# Patient Record
Sex: Male | Born: 1950 | Hispanic: No | Marital: Single
Health system: Southern US, Community
[De-identification: ages and names within clinical notes are randomized; demographics above are authoritative.]

## PROBLEM LIST (undated history)

## (undated) DIAGNOSIS — I482 Chronic atrial fibrillation, unspecified: Secondary | ICD-10-CM

## (undated) DIAGNOSIS — N186 End stage renal disease: Secondary | ICD-10-CM

## (undated) DIAGNOSIS — I5022 Chronic systolic (congestive) heart failure: Secondary | ICD-10-CM

## (undated) DIAGNOSIS — A0472 Enterocolitis due to Clostridium difficile, not specified as recurrent: Secondary | ICD-10-CM

## (undated) DIAGNOSIS — I350 Nonrheumatic aortic (valve) stenosis: Secondary | ICD-10-CM

## (undated) DIAGNOSIS — E119 Type 2 diabetes mellitus without complications: Secondary | ICD-10-CM

## (undated) DIAGNOSIS — J90 Pleural effusion, not elsewhere classified: Secondary | ICD-10-CM

## (undated) DIAGNOSIS — J9621 Acute and chronic respiratory failure with hypoxia: Secondary | ICD-10-CM

## (undated) HISTORY — PX: PEG PLACEMENT: SHX5437

## (undated) HISTORY — PX: BALLOON AORTIC VALVE VALVULOPLASTY: SHX6412

## (undated) HISTORY — PX: TRACHEOSTOMY: SUR1362

---

## 2019-11-08 HISTORY — PX: ESOPHAGOGASTRODUODENOSCOPY: SHX1529

## 2019-12-15 ENCOUNTER — Inpatient Hospital Stay
Admission: RE | Admit: 2019-12-15 | Discharge: 2020-03-07 | Disposition: E | Payer: Medicare Other | Source: Other Acute Inpatient Hospital | Attending: Internal Medicine | Admitting: Internal Medicine

## 2019-12-15 ENCOUNTER — Other Ambulatory Visit (HOSPITAL_COMMUNITY): Payer: Medicare Other

## 2019-12-15 DIAGNOSIS — R0603 Acute respiratory distress: Secondary | ICD-10-CM

## 2019-12-15 DIAGNOSIS — J9690 Respiratory failure, unspecified, unspecified whether with hypoxia or hypercapnia: Secondary | ICD-10-CM

## 2019-12-15 DIAGNOSIS — J189 Pneumonia, unspecified organism: Secondary | ICD-10-CM

## 2019-12-15 DIAGNOSIS — J939 Pneumothorax, unspecified: Secondary | ICD-10-CM

## 2019-12-15 DIAGNOSIS — Z9889 Other specified postprocedural states: Secondary | ICD-10-CM

## 2019-12-15 DIAGNOSIS — T17908A Unspecified foreign body in respiratory tract, part unspecified causing other injury, initial encounter: Secondary | ICD-10-CM

## 2019-12-15 DIAGNOSIS — I5022 Chronic systolic (congestive) heart failure: Secondary | ICD-10-CM | POA: Diagnosis present

## 2019-12-15 DIAGNOSIS — R0902 Hypoxemia: Secondary | ICD-10-CM

## 2019-12-15 DIAGNOSIS — Z992 Dependence on renal dialysis: Secondary | ICD-10-CM

## 2019-12-15 DIAGNOSIS — D72829 Elevated white blood cell count, unspecified: Secondary | ICD-10-CM

## 2019-12-15 DIAGNOSIS — J9621 Acute and chronic respiratory failure with hypoxia: Secondary | ICD-10-CM | POA: Diagnosis present

## 2019-12-15 DIAGNOSIS — Z431 Encounter for attention to gastrostomy: Secondary | ICD-10-CM

## 2019-12-15 DIAGNOSIS — K567 Ileus, unspecified: Secondary | ICD-10-CM

## 2019-12-15 DIAGNOSIS — Z9911 Dependence on respirator [ventilator] status: Secondary | ICD-10-CM

## 2019-12-15 DIAGNOSIS — Z931 Gastrostomy status: Secondary | ICD-10-CM

## 2019-12-15 DIAGNOSIS — Z95828 Presence of other vascular implants and grafts: Secondary | ICD-10-CM

## 2019-12-15 DIAGNOSIS — R1110 Vomiting, unspecified: Secondary | ICD-10-CM

## 2019-12-15 DIAGNOSIS — R110 Nausea: Secondary | ICD-10-CM

## 2019-12-15 DIAGNOSIS — I482 Chronic atrial fibrillation, unspecified: Secondary | ICD-10-CM | POA: Diagnosis present

## 2019-12-15 DIAGNOSIS — N186 End stage renal disease: Secondary | ICD-10-CM

## 2019-12-15 DIAGNOSIS — J90 Pleural effusion, not elsewhere classified: Secondary | ICD-10-CM

## 2019-12-15 HISTORY — DX: End stage renal disease: N18.6

## 2019-12-15 HISTORY — DX: Nonrheumatic aortic (valve) stenosis: I35.0

## 2019-12-15 HISTORY — DX: Pleural effusion, not elsewhere classified: J90

## 2019-12-15 HISTORY — DX: Acute and chronic respiratory failure with hypoxia: J96.21

## 2019-12-15 HISTORY — DX: Enterocolitis due to Clostridium difficile, not specified as recurrent: A04.72

## 2019-12-15 HISTORY — DX: Chronic systolic (congestive) heart failure: I50.22

## 2019-12-15 HISTORY — DX: Type 2 diabetes mellitus without complications: E11.9

## 2019-12-15 HISTORY — DX: Chronic atrial fibrillation, unspecified: I48.20

## 2019-12-15 LAB — BLOOD GAS, ARTERIAL
Acid-Base Excess: 1.9 mmol/L (ref 0.0–2.0)
Bicarbonate: 26.5 mmol/L (ref 20.0–28.0)
FIO2: 30
O2 Saturation: 98.8 %
Patient temperature: 36.6
pCO2 arterial: 44.7 mmHg (ref 32.0–48.0)
pH, Arterial: 7.388 (ref 7.350–7.450)
pO2, Arterial: 99.2 mmHg (ref 83.0–108.0)

## 2019-12-15 MED ORDER — IOHEXOL 300 MG/ML  SOLN: 50.0000 mL | Freq: Once | INTRAMUSCULAR | Status: DC | PRN

## 2019-12-16 DIAGNOSIS — J9621 Acute and chronic respiratory failure with hypoxia: Secondary | ICD-10-CM | POA: Diagnosis not present

## 2019-12-16 DIAGNOSIS — I5022 Chronic systolic (congestive) heart failure: Secondary | ICD-10-CM

## 2019-12-16 DIAGNOSIS — J90 Pleural effusion, not elsewhere classified: Secondary | ICD-10-CM | POA: Diagnosis not present

## 2019-12-16 DIAGNOSIS — I482 Chronic atrial fibrillation, unspecified: Secondary | ICD-10-CM

## 2019-12-16 DIAGNOSIS — N186 End stage renal disease: Secondary | ICD-10-CM

## 2019-12-16 DIAGNOSIS — Z992 Dependence on renal dialysis: Secondary | ICD-10-CM

## 2019-12-16 LAB — APTT: aPTT: 41 seconds — ABNORMAL HIGH (ref 24–36)

## 2019-12-16 LAB — PROTIME-INR
INR: 1.1 (ref 0.8–1.2)
Prothrombin Time: 14.1 seconds (ref 11.4–15.2)

## 2019-12-16 LAB — HEMOGLOBIN A1C
Hgb A1c MFr Bld: 4.4 % — ABNORMAL LOW (ref 4.8–5.6)
Mean Plasma Glucose: 79.58 mg/dL

## 2019-12-16 LAB — TSH: TSH: 16.546 u[IU]/mL — ABNORMAL HIGH (ref 0.350–4.500)

## 2019-12-16 NOTE — Consult Note (Signed)
[facility] [place]  PULMONARY SERVICE  Date of Service: 12/16/2019  PULMONARY CRITICAL CARE CONSULT   Jose Tucker  EGB:[id number]  DOB: [birth year]   DOA: 01/01/2020  Referring Physician: Merton Border, MD  HPI: Jose Tucker is a 69 y.o. male seen for follow up of Acute on Chronic Respiratory Failure.  Patient has multiple medical problems including chronic kidney disease diabetes mellitus hyperlipidemia hypertension who presents to the hospital because of having fallen out of bed and laying there for about 48 hours.  Patient apparently developed rhabdomyolysis and also had renal failure.  Echocardiogram showed an ejection fraction of 35%.  Patient apparently had an AV fistula which clotted off and that subsequently had to be removed.  Also had significant anemia requiring major transfusions.  The patient also has a history of severe aortic stenosis other issues included development of cardiac arrest.  Post arrest patient had ongoing issues with low heart rate as well as heart failure.  Patient now presents to our facility for further management and weaning.  Currently is on pressure support mode and is requiring 30% FiO2  Review of Systems:  ROS performed and is unremarkable other than noted above.  Past Medical History:  Diagnosis Date  . Chronic kidney disease  . Diabetes mellitus (CMS-HCC)  . Hyperlipidemia  . Hypertension   Past Surgical History:  Procedure Laterality Date  . CHG ANGIO EXTERMITY BILAT Bilateral 11/05/2019  Procedure: ABDOMINAL AORTAGRAM WITH BILATERAL RUNOFF AND POSSIBLE REVASCULARIZATION; Surgeon: Irene Limbo, MD; Location: [facility]; Service: Cardiology  . PR CATH PLACE/CORON ANGIO, IMG SUPER/INTERP,R&L HRT CATH, L HRT VENTRIC N/A 10/12/2019  Procedure: Left/Right Heart Catheterization W Intervention; Surgeon: Jerrye Beavers, MD; Location: [facility]; Service: Cardiology  . PR PERC CLOS,CONG INTERATRIAL  COMMUN W/IMPL N/A 11/05/2019  Procedure: BALLOON AORTIC VALVULOPLASTY; Surgeon: Baxter Flattery, MD; Location: [facility]; Service: Cardiology  . PR TRACHEOSTOMY, PLANNED N/A 11/28/2019  Procedure: TRACHEOSTOMY; Surgeon: Kennyth Lose, MD; Location: [facility]; Service: ENT   Allergies No Known Allergies   Medications: Reviewed on Rounds  Physical Exam:  Vitals: Temperature is 96.3 pulse 75 respiratory rate 22 blood pressure is 118/70 saturations 100%  Ventilator Settings mode ventilation pressure support FiO2 30% pressure poor 12 PEEP 5 tidal volume 423  . General: Comfortable at this time . Eyes: Grossly normal lids, irises & conjunctiva . ENT: grossly tongue is normal . Neck: no obvious mass . Cardiovascular: S1-S2 normal no gallop or rub . Respiratory: No rhonchi coarse breath sounds . Abdomen: Soft and nontender . Skin: no rash seen on limited exam . Musculoskeletal: not rigid . Psychiatric:unable to assess . Neurologic: no seizure no involuntary movements         Labs on Admission:  Basic Metabolic Panel: Recent Labs  Lab 12/16/19 0707  NA 136  K 4.1  CL 100  CO2 25  GLUCOSE 109*  BUN 47*  CREATININE 4.77*  CALCIUM 8.8*    Recent Labs  Lab 12/14/2019 2320  PHART 7.388  PCO2ART 44.7  PO2ART 99.2  HCO3 26.5  O2SAT 98.8    Liver Function Tests: Recent Labs  Lab 12/16/19 0707  AST 19  ALT 11  ALKPHOS 210*  BILITOT 0.8  PROT 6.3*  ALBUMIN 1.9*   No results for input(s): LIPASE, AMYLASE in the last 168 hours. No results for input(s): AMMONIA in the last 168 hours.  CBC: Recent Labs  Lab 12/16/19 0707  WBC 6.7  HGB 9.7*  HCT  31.4*  MCV 99.4  PLT 189    Cardiac Enzymes: No results for input(s): CKTOTAL, CKMB, CKMBINDEX, TROPONINI in the last 168 hours.  BNP (last 3 results) No results for input(s): BNP in the last 8760 hours.  ProBNP (last 3 results) No results for input(s): PROBNP in the last 8760 hours.   Radiological Exams on  Admission: DG ABDOMEN PEG TUBE LOCATION  Result Date: 12/16/2019 CLINICAL DATA:  Status post gastrostomy placement EXAM: ABDOMEN - 1 VIEW COMPARISON:  None. FINDINGS: 50 mL Omnipaque 300 injected through PEG tube. Peg tube is positioned over the distal body of the stomach. Contrast opacifies the stomach and proximal duodenum. No gross extravasation IMPRESSION: Peg tube appears position within distal body of stomach. No gross extravasation Electronically Signed   By: Donavan Foil M.D.   On: 12/08/2019 23:26   DG Chest Port 1 View  Result Date: 12/11/2019 CLINICAL DATA:  Status post PEG tube placement EXAM: PORTABLE CHEST 1 VIEW COMPARISON:  None. FINDINGS: Tracheostomy tube in place with tip about 3.5 cm superior to the carina. Right-sided central venous catheter tips over the SVC and proximal right atrium. Cardiomegaly with vascular congestion and diffuse hazy pulmonary opacity, likely combination of layering pleural effusion and pulmonary edema. Consolidation at both bases. IMPRESSION: 1. Support lines and tubes as above 2. Cardiomegaly with vascular congestion and hazy pulmonary opacity, likely combination of layering pleural effusion and underlying edema. Atelectasis versus pneumonia at the bases Electronically Signed   By: Donavan Foil M.D.   On: 12/13/2019 23:25    Assessment/Plan Active Problems:   Acute on chronic respiratory failure with hypoxia (HCC)   Chronic systolic (congestive) heart failure (HCC)   End stage renal disease on dialysis [facility])   Chronic atrial fibrillation (HCC)   Bilateral pleural effusion   1. Acute on chronic respiratory failure with hypoxia at this time patient is on the weaning protocol with pressure support patient does have a size #8 tracheostomy in place which might need to be downsized depending on how patient does with the weaning trials we will continue to monitor closely. 2. Pleural effusions noted on the chest x-ray might benefit from evaluation with  ultrasound to see if there is enough air to be removed 3. Chronic congestive heart failure patient's last ejection fraction was noted to be 35%.  Will diurese as tolerated no also monitor fluid status. 4. End-stage renal disease patient was on hemodialysis which will be continued. 5. Chronic atrial fibrillation rate now rate controlled we will continue to monitor.  I have personally seen and evaluated the patient, evaluated laboratory and imaging results, formulated the assessment and plan and placed orders. The Patient requires high complexity decision making with multiple systems involvement.  Case was discussed on Rounds with the Respiratory Therapy Director and the Respiratory staff Time Spent 39minutes  Jakell Trusty A Esco Joslyn, MD [facility] Pulmonary Critical Care Medicine Sleep Medicine

## 2019-12-17 ENCOUNTER — Encounter: Payer: Self-pay | Admitting: Internal Medicine

## 2019-12-17 DIAGNOSIS — J9621 Acute and chronic respiratory failure with hypoxia: Secondary | ICD-10-CM | POA: Diagnosis not present

## 2019-12-17 DIAGNOSIS — J90 Pleural effusion, not elsewhere classified: Secondary | ICD-10-CM | POA: Diagnosis present

## 2019-12-17 DIAGNOSIS — I5022 Chronic systolic (congestive) heart failure: Secondary | ICD-10-CM | POA: Diagnosis not present

## 2019-12-17 DIAGNOSIS — I482 Chronic atrial fibrillation, unspecified: Secondary | ICD-10-CM | POA: Diagnosis present

## 2019-12-17 DIAGNOSIS — N186 End stage renal disease: Secondary | ICD-10-CM

## 2019-12-17 LAB — HEPATITIS B CORE ANTIBODY, TOTAL: Hep B Core Total Ab: NONREACTIVE

## 2019-12-17 LAB — HEPATITIS B SURFACE ANTIGEN: Hepatitis B Surface Ag: NONREACTIVE

## 2019-12-17 MED ORDER — HEPARIN SODIUM (PORCINE) PF 5000 UNIT/0.5ML IJ SOLN
5000.00 | INTRAMUSCULAR | Status: DC
Start: 2019-12-15 — End: 2019-12-17

## 2019-12-17 MED ORDER — GLUCAGON (RDNA) 1 MG IJ KIT
1.00 | PACK | INTRAMUSCULAR | Status: DC
Start: ? — End: 2019-12-17

## 2019-12-17 MED ORDER — NYSTATIN 100000 UNIT/GM EX POWD
1.00 | CUTANEOUS | Status: DC
Start: 2019-12-15 — End: 2019-12-17

## 2019-12-17 MED ORDER — CYANOCOBALAMIN 1000 MCG PO TABS
1000.00 | ORAL_TABLET | ORAL | Status: DC
Start: 2019-12-16 — End: 2019-12-17

## 2019-12-17 MED ORDER — METOPROLOL TARTRATE 25 MG PO TABS
12.50 | ORAL_TABLET | ORAL | Status: DC
Start: 2019-12-15 — End: 2019-12-17

## 2019-12-17 MED ORDER — OXYCODONE HCL 5 MG/5ML PO SOLN
5.00 | ORAL | Status: DC
Start: ? — End: 2019-12-17

## 2019-12-17 MED ORDER — DEXTROSE 50 % IV SOLN
25.00 | INTRAVENOUS | Status: DC
Start: ? — End: 2019-12-17

## 2019-12-17 MED ORDER — GENERIC EXTERNAL MEDICATION
40.00 | Status: DC
Start: 2019-12-16 — End: 2019-12-17

## 2019-12-17 MED ORDER — INSULIN REGULAR HUMAN 100 UNIT/ML IJ SOLN
0.00 | INTRAMUSCULAR | Status: DC
Start: 2019-12-16 — End: 2019-12-17

## 2019-12-17 MED ORDER — ASPIRIN 81 MG PO CHEW
81.00 | CHEWABLE_TABLET | ORAL | Status: DC
Start: 2019-12-16 — End: 2019-12-17

## 2019-12-17 MED ORDER — GLUCOSE 40 % PO GEL
ORAL | Status: DC
Start: ? — End: 2019-12-17

## 2019-12-17 MED ORDER — EPOETIN ALFA-EPBX 10000 UNIT/ML IJ SOLN
20000.00 | INTRAMUSCULAR | Status: DC
Start: ? — End: 2019-12-17

## 2019-12-17 MED ORDER — OLANZAPINE 10 MG IM SOLR
5.00 | INTRAMUSCULAR | Status: DC
Start: ? — End: 2019-12-17

## 2019-12-17 MED ORDER — ACETAMINOPHEN 325 MG PO TABS
650.00 | ORAL_TABLET | ORAL | Status: DC
Start: ? — End: 2019-12-17

## 2019-12-17 MED ORDER — HEPARIN SODIUM (PORCINE) 1000 UNIT/ML IJ SOLN
2400.00 | INTRAMUSCULAR | Status: DC
Start: ? — End: 2019-12-17

## 2019-12-17 MED ORDER — MELATONIN 3 MG PO TABS
3.00 | ORAL_TABLET | ORAL | Status: DC
Start: 2019-12-16 — End: 2019-12-17

## 2019-12-17 MED ORDER — ONDANSETRON HCL 4 MG/2ML IJ SOLN
4.00 | INTRAMUSCULAR | Status: DC
Start: ? — End: 2019-12-17

## 2019-12-17 MED ORDER — ALBUMIN HUMAN 25 % IV SOLN
12.50 | INTRAVENOUS | Status: DC
Start: ? — End: 2019-12-17

## 2019-12-17 MED ORDER — CHLORHEXIDINE GLUCONATE 0.12 % MT SOLN
15.00 | OROMUCOSAL | Status: DC
Start: 2019-12-15 — End: 2019-12-17

## 2019-12-17 MED ORDER — DIPHENHYDRAMINE HCL 25 MG PO CAPS
25.00 | ORAL_CAPSULE | ORAL | Status: DC
Start: ? — End: 2019-12-17

## 2019-12-17 MED ORDER — PHENYLEPHRINE-MINERAL OIL-PET 0.25-14-74.9 % RE OINT
TOPICAL_OINTMENT | RECTAL | Status: DC
Start: ? — End: 2019-12-17

## 2019-12-17 MED ORDER — IPRATROPIUM-ALBUTEROL 0.5-2.5 (3) MG/3ML IN SOLN
3.00 | RESPIRATORY_TRACT | Status: DC
Start: ? — End: 2019-12-17

## 2019-12-17 MED ORDER — ATORVASTATIN CALCIUM 20 MG PO TABS
40.00 | ORAL_TABLET | ORAL | Status: DC
Start: 2019-12-16 — End: 2019-12-17

## 2019-12-17 MED ORDER — AMIODARONE HCL 200 MG PO TABS
100.00 | ORAL_TABLET | ORAL | Status: DC
Start: 2019-12-16 — End: 2019-12-17

## 2019-12-17 MED ORDER — ALBUTEROL SULFATE (2.5 MG/3ML) 0.083% IN NEBU
2.50 | INHALATION_SOLUTION | RESPIRATORY_TRACT | Status: DC
Start: ? — End: 2019-12-17

## 2019-12-17 MED ORDER — HEPARIN SODIUM (PORCINE) 1000 UNIT/ML IJ SOLN
2200.00 | INTRAMUSCULAR | Status: DC
Start: ? — End: 2019-12-17

## 2019-12-17 MED ORDER — PNEUMOCOCCAL VAC POLYVALENT 25 MCG/0.5ML IJ INJ
0.50 | INJECTION | INTRAMUSCULAR | Status: DC
Start: ? — End: 2019-12-17

## 2019-12-17 MED ORDER — MIDODRINE HCL 5 MG PO TABS
10.00 | ORAL_TABLET | ORAL | Status: DC
Start: 2019-12-16 — End: 2019-12-17

## 2019-12-17 MED ORDER — THIAMINE HCL 100 MG PO TABS
100.00 | ORAL_TABLET | ORAL | Status: DC
Start: 2019-12-16 — End: 2019-12-17

## 2019-12-17 NOTE — Consult Note (Signed)
[facility] CONSULT NOTE    Date: 12/17/2019                  Patient Name:  Jose Tucker  MRN: [id number]  DOB: [birth year]  Age / Sex: 69 y.o., male         PCP: Patient, No Pcp Per                 Service Requesting Consult: Hospitalist                 Reason for Consult: Evaluation and management of ESRD            History of Present Illness: Patient is a 69 y.o. male with a PMHx of ESRD on HD, CHF, valvular heart disease, hypertension, diabetes mellitus type 2, hyperlipidemia, obstructive sleep apnea, chronic venous stasis with LE edema, and obesity who was admitted to [facility] on 12/25/2019 for ongoing treatment of respiratory failure, ESRD, malnutrition, and generalized debility.  Patient was originally admitted to outside hospital on 10/07/2019.  Patient had prolonged course at the outside hospital.  Patient had multiple medical issues over the course the hospitalization.  He had respiratory failure with ventricular tachycardia arrest.  He had tracheostomy placed on 11/28/2019.  He also had a myocardial infarction during that time.  Patient also had left thigh cellulitis with abscess formation and is status post incision and drainage with hematoma evacuation.  Patient also had atrial fibrillation.  In regards to his ESRD patient apparently was having dialysis on MWF schedule.  His left AV fistula clotted off on 10/29/2019.  He had a tunneled dialysis catheter placed on 11/02/2019.  In addition patient has underlying moderate to severe aortic stenosis.   Medications: Outpatient medications: No medications prior to admission.    Current medications: Current Facility-Administered Medications  Medication Dose Route Frequency Provider Last Rate Last Admin  . iohexol (OMNIPAQUE) 300 MG/ML solution 50 mL  50 mL Per Tube Once PRN Rosaria Ferries, MD          Allergies: No known drug allergies   Past Medical History: ESRD on HD, CHF, valvular heart  disease, hypertension, diabetes mellitus type 2, hyperlipidemia, obstructive sleep apnea, chronic venous stasis with LE edema, aortic stenosis, atrial fibrillation, anemia of chronic kidney disease, secondary hyperparathyroidism, sacral decubitus ulcer, tobacco abuse  Past Surgical History: Tracheostomy placement PEG tube placement Right IJ PermCath  Family History: Unable to obtain from the patient as he is on the ventilator.  Social History: Unable to obtain from patient as he is currently on the ventilator.  Review of Systems: Unable to obtain from patient as he is currently on the ventilator.  Vital Signs: Temperature 97.1 pulse 72 respirations 20 blood pressure 127/67 Weight trends: There were no vitals filed for this visit.  Physical Exam: General: Chronically ill-appearing  Head: Normocephalic, atraumatic.  Eyes: Anicteric, EOMI  Nose: Mucous membranes moist, not inflammed, nonerythematous.  Throat: Oropharynx nonerythematous, no exudate appreciated.   Neck: Tracheostomy in place  Lungs:  Scattered rhonchi bilateral, vent assisted  Heart: Irregular, 2/6 systolic ejection murmur  Abdomen:  BS normoactive. Soft, Nondistended, non-tender.  No masses or organomegaly.  Extremities: 1+ pretibial edema.  Neurologic: Arousable, will follow simple commands  Skin: No visible rashes, scars.    Lab results: Basic Metabolic Panel: Recent Labs  Lab 12/16/19 0707 12/17/19 0623  NA 136 134*  K 4.1 4.3  CL 100 99  CO2 25 22  GLUCOSE 109* 125*  BUN 47* 57*  CREATININE 4.77* 5.41*  CALCIUM 8.8* 8.7*  PHOS  --  3.3    Liver Function Tests: Recent Labs  Lab 12/16/19 0707 12/17/19 0623  AST 19  --   ALT 11  --   ALKPHOS 210*  --   BILITOT 0.8  --   PROT 6.3*  --   ALBUMIN 1.9* 1.8*   No results for input(s): LIPASE, AMYLASE in the last 168 hours. No results for input(s): AMMONIA in the last 168 hours.  CBC: Recent Labs  Lab 12/16/19 0707  WBC 6.7  HGB 9.7*   HCT 31.4*  MCV 99.4  PLT 189    Cardiac Enzymes: No results for input(s): CKTOTAL, CKMB, CKMBINDEX, TROPONINI in the last 168 hours.  BNP: Invalid input(s): POCBNP  CBG: No results for input(s): GLUCAP in the last 168 hours.  Microbiology: No results found for this or any previous visit.  Coagulation Studies: Recent Labs    12/16/19 0707  LABPROT 14.1  INR 1.1    Urinalysis: No results for input(s): COLORURINE, LABSPEC, PHURINE, GLUCOSEU, HGBUR, BILIRUBINUR, KETONESUR, PROTEINUR, UROBILINOGEN, NITRITE, LEUKOCYTESUR in the last 72 hours.  Invalid input(s): APPERANCEUR    Imaging: DG ABDOMEN PEG TUBE LOCATION  Result Date: 12/28/2019 CLINICAL DATA:  Status post gastrostomy placement EXAM: ABDOMEN - 1 VIEW COMPARISON:  None. FINDINGS: 50 mL Omnipaque 300 injected through PEG tube. Peg tube is positioned over the distal body of the stomach. Contrast opacifies the stomach and proximal duodenum. No gross extravasation IMPRESSION: Peg tube appears position within distal body of stomach. No gross extravasation Electronically Signed   By: Donavan Foil M.D.   On: 12/18/2019 23:26   DG Chest Port 1 View  Result Date: 12/06/2019 CLINICAL DATA:  Status post PEG tube placement EXAM: PORTABLE CHEST 1 VIEW COMPARISON:  None. FINDINGS: Tracheostomy tube in place with tip about 3.5 cm superior to the carina. Right-sided central venous catheter tips over the SVC and proximal right atrium. Cardiomegaly with vascular congestion and diffuse hazy pulmonary opacity, likely combination of layering pleural effusion and pulmonary edema. Consolidation at both bases. IMPRESSION: 1. Support lines and tubes as above 2. Cardiomegaly with vascular congestion and hazy pulmonary opacity, likely combination of layering pleural effusion and underlying edema. Atelectasis versus pneumonia at the bases Electronically Signed   By: Donavan Foil M.D.   On: 12/16/2019 23:25      Assessment & Plan: Pt is a 69  y.o. male with a PMHx of ESRD on HD, CHF, aortic stenosis, hypertension, diabetes mellitus type 2, hyperlipidemia, obstructive sleep apnea, chronic venous stasis with LE edema, and obesity who was admitted to [facility] on 01/01/2020 for ongoing treatment of respiratory failure, ESRD, malnutrition, and generalized debility.    1.  ESRD on HD MWF.  Patient due for hemodialysis today.  We will prepare orders.  We plan to use right internal jugular PermCath as his left upper extremity AV fistula is clotted.  2.  Acute respiratory failure.  Patient still on ventilatory support.  Recommend pulmonary/critical care evaluation.  3.  Secondary hyperparathyroidism.  Check PTH and phosphorus with dialysis treatment today.  4.  Anemia of chronic kidney disease.  Hemoglobin 9.7.  Initiate the patient on Retacrit 4000 units IV with dialysis treatments.

## 2019-12-17 NOTE — Progress Notes (Signed)
Pulmonary Critical Care Medicine [place]   PULMONARY CRITICAL CARE SERVICE  PROGRESS NOTE  Date of Service: 12/17/2019  Jose Tucker  KNL:[id number]  DOB: [birth year]   DOA: 12/07/2019  Referring Physician: Merton Border, MD  HPI: Jose Tucker is a 69 y.o. male seen for follow up of Acute on Chronic Respiratory Failure.  Patient 16-hour goal today on pressure support 12/5 FiO2 30% satting well this time with no fever distress.  Medications: Reviewed on Rounds  Physical Exam:  Vitals: Pulse 72 respirations 20 BP 127/67 O2 sat 100% temp 97.1  Ventilator Settings pressure support 12/5 FiO2 30%  . General: Comfortable at this time . Eyes: Grossly normal lids, irises & conjunctiva . ENT: grossly tongue is normal . Neck: no obvious mass . Cardiovascular: S1 S2 normal no gallop . Respiratory: No rales or rhonchi noted . Abdomen: soft . Skin: no rash seen on limited exam . Musculoskeletal: not rigid . Psychiatric:unable to assess . Neurologic: no seizure no involuntary movements         Lab Data:   Basic Metabolic Panel: Recent Labs  Lab 12/16/19 0707 12/17/19 0623  NA 136 134*  K 4.1 4.3  CL 100 99  CO2 25 22  GLUCOSE 109* 125*  BUN 47* 57*  CREATININE 4.77* 5.41*  CALCIUM 8.8* 8.7*  PHOS  --  3.3    ABG: Recent Labs  Lab 12/17/2019 2320  PHART 7.388  PCO2ART 44.7  PO2ART 99.2  HCO3 26.5  O2SAT 98.8    Liver Function Tests: Recent Labs  Lab 12/16/19 0707 12/17/19 0623  AST 19  --   ALT 11  --   ALKPHOS 210*  --   BILITOT 0.8  --   PROT 6.3*  --   ALBUMIN 1.9* 1.8*   No results for input(s): LIPASE, AMYLASE in the last 168 hours. No results for input(s): AMMONIA in the last 168 hours.  CBC: Recent Labs  Lab 12/16/19 0707  WBC 6.7  HGB 9.7*  HCT 31.4*  MCV 99.4  PLT 189    Cardiac Enzymes: No results for input(s): CKTOTAL, CKMB, CKMBINDEX, TROPONINI in the last 168 hours.  BNP (last 3 results) No results for  input(s): BNP in the last 8760 hours.  ProBNP (last 3 results) No results for input(s): PROBNP in the last 8760 hours.  Radiological Exams: DG ABDOMEN PEG TUBE LOCATION  Result Date: 12/12/2019 CLINICAL DATA:  Status post gastrostomy placement EXAM: ABDOMEN - 1 VIEW COMPARISON:  None. FINDINGS: 50 mL Omnipaque 300 injected through PEG tube. Peg tube is positioned over the distal body of the stomach. Contrast opacifies the stomach and proximal duodenum. No gross extravasation IMPRESSION: Peg tube appears position within distal body of stomach. No gross extravasation Electronically Signed   By: Donavan Foil M.D.   On: 12/12/2019 23:26   DG Chest Port 1 View  Result Date: 12/30/2019 CLINICAL DATA:  Status post PEG tube placement EXAM: PORTABLE CHEST 1 VIEW COMPARISON:  None. FINDINGS: Tracheostomy tube in place with tip about 3.5 cm superior to the carina. Right-sided central venous catheter tips over the SVC and proximal right atrium. Cardiomegaly with vascular congestion and diffuse hazy pulmonary opacity, likely combination of layering pleural effusion and pulmonary edema. Consolidation at both bases. IMPRESSION: 1. Support lines and tubes as above 2. Cardiomegaly with vascular congestion and hazy pulmonary opacity, likely combination of layering pleural effusion and underlying edema. Atelectasis versus pneumonia at the bases Electronically Signed   By: Maudie Mercury  Francoise Ceo M.D.   On: 12/25/2019 23:25    Assessment/Plan Active Problems:   Acute on chronic respiratory failure with hypoxia (HCC)   Chronic systolic (congestive) heart failure (HCC)   End stage renal disease on dialysis [facility])   Chronic atrial fibrillation (HCC)   Bilateral pleural effusion   1. Acute on chronic respiratory failure with hypoxia plan is to continue with the wean protocol as tolerated. 2. Chronic systolic heart failure right now is compensated 3. End-stage renal failure on hemodialysis we will continue to  monitor. 4. Chronic atrial fibrillation rate is controlled 5. Bilateral pleural effusions no change   I have personally seen and evaluated the patient, evaluated laboratory and imaging results, formulated the assessment and plan and placed orders. The Patient requires high complexity decision making with multiple systems involvement.  Rounds were done with the Respiratory Therapy Director and Staff therapists and discussed with nursing staff also.  Allyne Gee, MD [facility] Pulmonary Critical Care Medicine Sleep Medicine

## 2019-12-18 DIAGNOSIS — I482 Chronic atrial fibrillation, unspecified: Secondary | ICD-10-CM | POA: Diagnosis not present

## 2019-12-18 DIAGNOSIS — J90 Pleural effusion, not elsewhere classified: Secondary | ICD-10-CM | POA: Diagnosis not present

## 2019-12-18 DIAGNOSIS — I5022 Chronic systolic (congestive) heart failure: Secondary | ICD-10-CM | POA: Diagnosis not present

## 2019-12-18 DIAGNOSIS — J9621 Acute and chronic respiratory failure with hypoxia: Secondary | ICD-10-CM | POA: Diagnosis not present

## 2019-12-18 LAB — C DIFFICILE QUICK SCREEN W PCR REFLEX
C Diff antigen: NEGATIVE
C Diff interpretation: NOT DETECTED
C Diff toxin: NEGATIVE

## 2019-12-18 NOTE — Progress Notes (Signed)
Pulmonary Critical Care Medicine [place]   PULMONARY CRITICAL CARE SERVICE  PROGRESS NOTE  Date of Service: 12/18/2019  Bynum Mccullars  WGN:[id number]  DOB: April 15, 1951   DOA: 12/18/2019  Referring Physician: Merton Border, MD  HPI: Jose Tucker is a 69 y.o. male seen for follow up of Acute on Chronic Respiratory Failure.  Patient is on the wean protocol has been on 28% FiO2 patient was on T collar weaning  Medications: Reviewed on Rounds  Physical Exam:  Vitals: Temperature is 96.7 pulse 66 respiratory rate 30 blood pressure is 148/77 saturations 98%  Ventilator Settings on T collar with an FiO2 of 28%  . General: Comfortable at this time . Eyes: Grossly normal lids, irises & conjunctiva . ENT: grossly tongue is normal . Neck: no obvious mass . Cardiovascular: S1 S2 normal no gallop . Respiratory: No rhonchi coarse breath sounds . Abdomen: soft . Skin: no rash seen on limited exam . Musculoskeletal: not rigid . Psychiatric:unable to assess . Neurologic: no seizure no involuntary movements         Lab Data:   Basic Metabolic Panel: Recent Labs  Lab 12/16/19 0707 12/17/19 0623  NA 136 134*  K 4.1 4.3  CL 100 99  CO2 25 22  GLUCOSE 109* 125*  BUN 47* 57*  CREATININE 4.77* 5.41*  CALCIUM 8.8* 8.7*  PHOS  --  3.3    ABG: Recent Labs  Lab 12/10/2019 2320  PHART 7.388  PCO2ART 44.7  PO2ART 99.2  HCO3 26.5  O2SAT 98.8    Liver Function Tests: Recent Labs  Lab 12/16/19 0707 12/17/19 0623  AST 19  --   ALT 11  --   ALKPHOS 210*  --   BILITOT 0.8  --   PROT 6.3*  --   ALBUMIN 1.9* 1.8*   No results for input(s): LIPASE, AMYLASE in the last 168 hours. No results for input(s): AMMONIA in the last 168 hours.  CBC: Recent Labs  Lab 12/16/19 0707 12/17/19 1109  WBC 6.7 7.2  HGB 9.7* 9.4*  HCT 31.4* 30.9*  MCV 99.4 98.1  PLT 189 209    Cardiac Enzymes: No results for input(s): CKTOTAL, CKMB, CKMBINDEX, TROPONINI in the last  168 hours.  BNP (last 3 results) No results for input(s): BNP in the last 8760 hours.  ProBNP (last 3 results) No results for input(s): PROBNP in the last 8760 hours.  Radiological Exams: No results found.  Assessment/Plan Active Problems:   Acute on chronic respiratory failure with hypoxia (HCC)   Chronic systolic (congestive) heart failure (HCC)   End stage renal disease on dialysis [facility])   Chronic atrial fibrillation (HCC)   Bilateral pleural effusion   1. Acute on chronic respiratory failure with hypoxia plan continue with wean off collar today goal is well tomorrow will be doubled.  Continue with present management supportive care. 2. Chronic systolic heart failure right now compensated we will continue to follow along 3. End-stage renal disease on hemodialysis 4. Bilateral pleural effusions no change we will continue to follow. 5. Chronic atrial fibrillation rate is controlled   I have personally seen and evaluated the patient, evaluated laboratory and imaging results, formulated the assessment and plan and placed orders. The Patient requires high complexity decision making with multiple systems involvement.  Rounds were done with the Respiratory Therapy Director and Staff therapists and discussed with nursing staff also.  Allyne Gee, MD [facility] Pulmonary Critical Care Medicine Sleep Medicine

## 2019-12-19 DIAGNOSIS — I482 Chronic atrial fibrillation, unspecified: Secondary | ICD-10-CM | POA: Diagnosis not present

## 2019-12-19 DIAGNOSIS — J9621 Acute and chronic respiratory failure with hypoxia: Secondary | ICD-10-CM | POA: Diagnosis not present

## 2019-12-19 DIAGNOSIS — J90 Pleural effusion, not elsewhere classified: Secondary | ICD-10-CM | POA: Diagnosis not present

## 2019-12-19 DIAGNOSIS — I5022 Chronic systolic (congestive) heart failure: Secondary | ICD-10-CM | POA: Diagnosis not present

## 2019-12-19 NOTE — Progress Notes (Signed)
Pulmonary Critical Care Medicine [place]   PULMONARY CRITICAL CARE SERVICE  PROGRESS NOTE  Date of Service: 12/19/2019  Jose Tucker  IDP:[id number]  DOB: Mar 14, 1951   DOA: 12/30/2019  Referring Physician: Merton Border, MD  HPI: Jose Tucker is a 69 y.o. male seen for follow up of Acute on Chronic Respiratory Failure.  Patient right now is on T collar has been on 35% FiO2 doing fairly well at this time  Medications: Reviewed on Rounds  Physical Exam:  Vitals: Temperature is 96.2 pulse 62 respiratory 20 blood pressure is 141/72 saturations 100%  Ventilator Settings on T collar with an FiO2 of 35%  . General: Comfortable at this time . Eyes: Grossly normal lids, irises & conjunctiva . ENT: grossly tongue is normal . Neck: no obvious mass . Cardiovascular: S1 S2 normal no gallop . Respiratory: No rhonchi no rales are noted at this time . Abdomen: soft . Skin: no rash seen on limited exam . Musculoskeletal: not rigid . Psychiatric:unable to assess . Neurologic: no seizure no involuntary movements         Lab Data:   Basic Metabolic Panel: Recent Labs  Lab 12/16/19 0707 12/17/19 0623  NA 136 134*  K 4.1 4.3  CL 100 99  CO2 25 22  GLUCOSE 109* 125*  BUN 47* 57*  CREATININE 4.77* 5.41*  CALCIUM 8.8* 8.7*  PHOS  --  3.3    ABG: Recent Labs  Lab 12/19/2019 2320  PHART 7.388  PCO2ART 44.7  PO2ART 99.2  HCO3 26.5  O2SAT 98.8    Liver Function Tests: Recent Labs  Lab 12/16/19 0707 12/17/19 0623  AST 19  --   ALT 11  --   ALKPHOS 210*  --   BILITOT 0.8  --   PROT 6.3*  --   ALBUMIN 1.9* 1.8*   No results for input(s): LIPASE, AMYLASE in the last 168 hours. No results for input(s): AMMONIA in the last 168 hours.  CBC: Recent Labs  Lab 12/16/19 0707 12/17/19 1109  WBC 6.7 7.2  HGB 9.7* 9.4*  HCT 31.4* 30.9*  MCV 99.4 98.1  PLT 189 209    Cardiac Enzymes: No results for input(s): CKTOTAL, CKMB, CKMBINDEX, TROPONINI in the  last 168 hours.  BNP (last 3 results) No results for input(s): BNP in the last 8760 hours.  ProBNP (last 3 results) No results for input(s): PROBNP in the last 8760 hours.  Radiological Exams: No results found.  Assessment/Plan Active Problems:   Acute on chronic respiratory failure with hypoxia (HCC)   Chronic systolic (congestive) heart failure (HCC)   End stage renal disease on dialysis [facility])   Chronic atrial fibrillation (HCC)   Bilateral pleural effusion   1. Acute on chronic respiratory failure with oxygen right now on T collar FiO2 35% we will continue to monitor closely 2. Chronic systolic heart failure compensated 3. End-stage renal disease on hemodialysis 4. Chronic atrial fibrillation rate controlled 5. Bilateral effusions no change we will continue to follow   I have personally seen and evaluated the patient, evaluated laboratory and imaging results, formulated the assessment and plan and placed orders. The Patient requires high complexity decision making with multiple systems involvement.  Rounds were done with the Respiratory Therapy Director and Staff therapists and discussed with nursing staff also.  Allyne Gee, MD [facility] Pulmonary Critical Care Medicine Sleep Medicine

## 2019-12-20 DIAGNOSIS — I482 Chronic atrial fibrillation, unspecified: Secondary | ICD-10-CM | POA: Diagnosis not present

## 2019-12-20 DIAGNOSIS — J9621 Acute and chronic respiratory failure with hypoxia: Secondary | ICD-10-CM | POA: Diagnosis not present

## 2019-12-20 DIAGNOSIS — I5022 Chronic systolic (congestive) heart failure: Secondary | ICD-10-CM | POA: Diagnosis not present

## 2019-12-20 DIAGNOSIS — J90 Pleural effusion, not elsewhere classified: Secondary | ICD-10-CM | POA: Diagnosis not present

## 2019-12-20 LAB — TSH: TSH: 19.591 u[IU]/mL — ABNORMAL HIGH (ref 0.350–4.500)

## 2019-12-20 LAB — T4, FREE: Free T4: 0.84 ng/dL (ref 0.61–1.12)

## 2019-12-20 NOTE — Progress Notes (Signed)
Pulmonary Critical Care Medicine [place]   PULMONARY CRITICAL CARE SERVICE  PROGRESS NOTE  Date of Service: 12/20/2019  Jose Tucker  RCV:[id number]  DOB: 04-Sep-1951   DOA: 12/12/2019  Referring Physician: Merton Border, MD  HPI: Jose Tucker is a 69 y.o. male seen for follow up of Acute on Chronic Respiratory Failure.  Patient is weaning right now is on T collar has been on 28% FiO2 with a goal of 12 hours  Medications: Reviewed on Rounds  Physical Exam:  Vitals: Temperature is 96.3 pulse 63 respiratory 23 blood pressure is 136/76 saturations 97%  Ventilator Settings off the ventilator on T collar with an FiO2 of 28%  . General: Comfortable at this time . Eyes: Grossly normal lids, irises & conjunctiva . ENT: grossly tongue is normal . Neck: no obvious mass . Cardiovascular: S1 S2 normal no gallop . Respiratory: No rhonchi no rales are noted at this time . Abdomen: soft . Skin: no rash seen on limited exam . Musculoskeletal: not rigid . Psychiatric:unable to assess . Neurologic: no seizure no involuntary movements         Lab Data:   Basic Metabolic Panel: Recent Labs  Lab 12/16/19 0707 12/17/19 0623 12/20/19 0504  NA 136 134* 136  K 4.1 4.3 4.4  CL 100 99 95*  CO2 25 22 29   GLUCOSE 109* 125* 105*  BUN 47* 57* 55*  CREATININE 4.77* 5.41* 5.36*  CALCIUM 8.8* 8.7* 9.3  PHOS  --  3.3 4.5    ABG: Recent Labs  Lab 12/20/2019 2320  PHART 7.388  PCO2ART 44.7  PO2ART 99.2  HCO3 26.5  O2SAT 98.8    Liver Function Tests: Recent Labs  Lab 12/16/19 0707 12/17/19 0623 12/20/19 0504  AST 19  --   --   ALT 11  --   --   ALKPHOS 210*  --   --   BILITOT 0.8  --   --   PROT 6.3*  --   --   ALBUMIN 1.9* 1.8* 2.0*   No results for input(s): LIPASE, AMYLASE in the last 168 hours. No results for input(s): AMMONIA in the last 168 hours.  CBC: Recent Labs  Lab 12/16/19 0707 12/17/19 1109 12/20/19 0504  WBC 6.7 7.2 7.9  HGB 9.7* 9.4*  9.9*  HCT 31.4* 30.9* 32.1*  MCV 99.4 98.1 100.3*  PLT 189 209 217    Cardiac Enzymes: No results for input(s): CKTOTAL, CKMB, CKMBINDEX, TROPONINI in the last 168 hours.  BNP (last 3 results) No results for input(s): BNP in the last 8760 hours.  ProBNP (last 3 results) No results for input(s): PROBNP in the last 8760 hours.  Radiological Exams: No results found.  Assessment/Plan Active Problems:   Acute on chronic respiratory failure with hypoxia (HCC)   Chronic systolic (congestive) heart failure (HCC)   End stage renal disease on dialysis [facility])   Chronic atrial fibrillation (HCC)   Bilateral pleural effusion   1. Acute on chronic respiratory failure with hypoxia we will continue with T collar trials titrate oxygen continue pulmonary toilet 2. Chronic systolic heart failure right now is compensated 3. End-stage renal disease on hemodialysis 4. Chronic atrial fibrillation rate controlled we will continue supportive care 5. Pleural effusions at baseline we will continue to monitor   I have personally seen and evaluated the patient, evaluated laboratory and imaging results, formulated the assessment and plan and placed orders. The Patient requires high complexity decision making with multiple systems involvement.  Rounds were done with the Respiratory Therapy Director and Staff therapists and discussed with nursing staff also.  Allyne Gee, MD [facility] Pulmonary Critical Care Medicine Sleep Medicine

## 2019-12-20 NOTE — Progress Notes (Signed)
[facility]  ROUNDING NOTE   Subjective:  Patient seen and evaluated at bedside. Due for hemodialysis today. Still on the ventilator requiring 28% FiO2.   Objective:  Vital signs in last 24 hours:  Temperature 96 pulse 63 respirations 20 blood pressure 136/76  Physical Exam: General: Chronically ill-appearing  Head: Normocephalic, atraumatic. Moist oral mucosal membranes  Eyes: Anicteric  Neck: Tracheostomy in place  Lungs:  Scattered rhonchi bilateral, vent assisted  Heart: S1S2 no rubs  Abdomen:  Soft, nontender, bowel sounds present  Extremities: Trace peripheral edema.  Neurologic: Awake, alert, following commands  Skin: No lesions  Access: Right IJ PermCath    Basic Metabolic Panel: Recent Labs  Lab 12/16/19 0707 12/17/19 0623 12/20/19 0504  NA 136 134* 136  K 4.1 4.3 4.4  CL 100 99 95*  CO2 25 22 29   GLUCOSE 109* 125* 105*  BUN 47* 57* 55*  CREATININE 4.77* 5.41* 5.36*  CALCIUM 8.8* 8.7* 9.3  PHOS  --  3.3 4.5    Liver Function Tests: Recent Labs  Lab 12/16/19 0707 12/17/19 0623 12/20/19 0504  AST 19  --   --   ALT 11  --   --   ALKPHOS 210*  --   --   BILITOT 0.8  --   --   PROT 6.3*  --   --   ALBUMIN 1.9* 1.8* 2.0*   No results for input(s): LIPASE, AMYLASE in the last 168 hours. No results for input(s): AMMONIA in the last 168 hours.  CBC: Recent Labs  Lab 12/16/19 0707 12/17/19 1109 12/20/19 0504  WBC 6.7 7.2 7.9  HGB 9.7* 9.4* 9.9*  HCT 31.4* 30.9* 32.1*  MCV 99.4 98.1 100.3*  PLT 189 209 217    Cardiac Enzymes: No results for input(s): CKTOTAL, CKMB, CKMBINDEX, TROPONINI in the last 168 hours.  BNP: Invalid input(s): POCBNP  CBG: No results for input(s): GLUCAP in the last 168 hours.  Microbiology: Results for orders placed or performed during the hospital encounter of 12/14/2019  C difficile quick scan w PCR reflex     Status: None   Collection Time: 12/18/19  4:49 AM   Specimen: STOOL  Result Value Ref  Range Status   C Diff antigen NEGATIVE NEGATIVE Final   C Diff toxin NEGATIVE NEGATIVE Final   C Diff interpretation No C. difficile detected.  Corrected    Comment: Performed at [facility], [place] [address], [place], [place] [address] CORRECTED ON 03/13 AT 1510: PREVIOUSLY REPORTED AS VALID     Coagulation Studies: No results for input(s): LABPROT, INR in the last 72 hours.  Urinalysis: No results for input(s): COLORURINE, LABSPEC, PHURINE, GLUCOSEU, HGBUR, BILIRUBINUR, KETONESUR, PROTEINUR, UROBILINOGEN, NITRITE, LEUKOCYTESUR in the last 72 hours.  Invalid input(s): APPERANCEUR    Imaging: No results found.   Medications:     iohexol  Assessment/ Plan:  69 y.o. male with a PMHx of ESRD on HD, CHF, aortic stenosis, hypertension, diabetes mellitus type 2, hyperlipidemia, obstructive sleep apnea, chronic venous stasis with LE edema, and obesity who was admitted to [facility] on 12/27/2019 for ongoing treatment of respiratory failure, ESRD, malnutrition, and generalized debility.    1.  ESRD on HD MWF.    Patient due for hemodialysis treatment today.  Orders have been prepared.  2.  Acute respiratory failure.    Patient maintained on ventilatory support.  FiO2 stable at 20%.  3.  Secondary hyperparathyroidism.    Phosphorus currently 4.5 and acceptable.  Continue to monitor serum.  4.  Anemia of chronic kidney disease.  Hemoglobin 9.9.  Maintain the patient on Retacrit.   LOS: 0 Crosby Bevan 3/15/20218:27 AM

## 2019-12-21 DIAGNOSIS — J9621 Acute and chronic respiratory failure with hypoxia: Secondary | ICD-10-CM | POA: Diagnosis not present

## 2019-12-21 DIAGNOSIS — J90 Pleural effusion, not elsewhere classified: Secondary | ICD-10-CM | POA: Diagnosis not present

## 2019-12-21 DIAGNOSIS — I5022 Chronic systolic (congestive) heart failure: Secondary | ICD-10-CM | POA: Diagnosis not present

## 2019-12-21 DIAGNOSIS — I482 Chronic atrial fibrillation, unspecified: Secondary | ICD-10-CM | POA: Diagnosis not present

## 2019-12-21 NOTE — Progress Notes (Signed)
Pulmonary Critical Care Medicine [place]   PULMONARY CRITICAL CARE SERVICE  PROGRESS NOTE  Date of Service: 12/21/2019  Jose Tucker  HYI:[id number]  DOB: 01/28/1951   DOA: 12/08/2019  Referring Physician: Merton Border, MD  HPI: Jose Tucker is a 69 y.o. male seen for follow up of Acute on Chronic Respiratory Failure.  Patient at this time is on T collar has been on 40% FiO2 the goal is right now 16 hours  Medications: Reviewed on Rounds  Physical Exam:  Vitals: Temperature is 97.2 pulse 67 respiratory 20 blood pressure is 119/67 saturations 97%  Ventilator Settings T collar with an FiO2 of 40%  . General: Comfortable at this time . Eyes: Grossly normal lids, irises & conjunctiva . ENT: grossly tongue is normal . Neck: no obvious mass . Cardiovascular: S1 S2 normal no gallop . Respiratory: No rhonchi no rales are noted at this time . Abdomen: soft . Skin: no rash seen on limited exam . Musculoskeletal: not rigid . Psychiatric:unable to assess . Neurologic: no seizure no involuntary movements         Lab Data:   Basic Metabolic Panel: Recent Labs  Lab 12/16/19 0707 12/17/19 0623 12/20/19 0504  NA 136 134* 136  K 4.1 4.3 4.4  CL 100 99 95*  CO2 25 22 29   GLUCOSE 109* 125* 105*  BUN 47* 57* 55*  CREATININE 4.77* 5.41* 5.36*  CALCIUM 8.8* 8.7* 9.3  PHOS  --  3.3 4.5    ABG: Recent Labs  Lab 01/02/2020 2320  PHART 7.388  PCO2ART 44.7  PO2ART 99.2  HCO3 26.5  O2SAT 98.8    Liver Function Tests: Recent Labs  Lab 12/16/19 0707 12/17/19 0623 12/20/19 0504  AST 19  --   --   ALT 11  --   --   ALKPHOS 210*  --   --   BILITOT 0.8  --   --   PROT 6.3*  --   --   ALBUMIN 1.9* 1.8* 2.0*   No results for input(s): LIPASE, AMYLASE in the last 168 hours. No results for input(s): AMMONIA in the last 168 hours.  CBC: Recent Labs  Lab 12/16/19 0707 12/17/19 1109 12/20/19 0504  WBC 6.7 7.2 7.9  HGB 9.7* 9.4* 9.9*  HCT 31.4* 30.9*  32.1*  MCV 99.4 98.1 100.3*  PLT 189 209 217    Cardiac Enzymes: No results for input(s): CKTOTAL, CKMB, CKMBINDEX, TROPONINI in the last 168 hours.  BNP (last 3 results) No results for input(s): BNP in the last 8760 hours.  ProBNP (last 3 results) No results for input(s): PROBNP in the last 8760 hours.  Radiological Exams: No results found.  Assessment/Plan Active Problems:   Acute on chronic respiratory failure with hypoxia (HCC)   Chronic systolic (congestive) heart failure (HCC)   End stage renal disease on dialysis [facility])   Chronic atrial fibrillation (HCC)   Bilateral pleural effusion   1. Acute on chronic respiratory failure with hypoxia continue with T collar as tolerated: 16 hours 2. Chronic systolic heart failure compensated we will continue to follow 3. Chronic atrial fibrillation rate controlled 4. Bilateral pleural effusions right now baseline we will monitor him   I have personally seen and evaluated the patient, evaluated laboratory and imaging results, formulated the assessment and plan and placed orders. The Patient requires high complexity decision making with multiple systems involvement.  Rounds were done with the Respiratory Therapy Director and Staff therapists and discussed with nursing staff  also.  Allyne Gee, MD [facility] Pulmonary Critical Care Medicine Sleep Medicine

## 2019-12-22 DIAGNOSIS — I5022 Chronic systolic (congestive) heart failure: Secondary | ICD-10-CM | POA: Diagnosis not present

## 2019-12-22 DIAGNOSIS — J90 Pleural effusion, not elsewhere classified: Secondary | ICD-10-CM | POA: Diagnosis not present

## 2019-12-22 DIAGNOSIS — J9621 Acute and chronic respiratory failure with hypoxia: Secondary | ICD-10-CM | POA: Diagnosis not present

## 2019-12-22 DIAGNOSIS — I482 Chronic atrial fibrillation, unspecified: Secondary | ICD-10-CM | POA: Diagnosis not present

## 2019-12-22 NOTE — Progress Notes (Signed)
[facility]  ROUNDING NOTE   Subjective:  Patient seen and evaluated at bedside. Currently off of the ventilator. Patient currently on aerosolized trach collar.   Objective:  Vital signs in last 24 hours:  Temperature 96.5 pulse 73 respirations 23 blood pressure 119/66  Physical Exam: General: Chronically ill-appearing  Head: Normocephalic, atraumatic. Moist oral mucosal membranes  Eyes: Anicteric  Neck: Tracheostomy in place  Lungs:  Scattered rhonchi bilateral  Heart: S1S2 no rubs  Abdomen:  Soft, nontender, bowel sounds present  Extremities: Trace peripheral edema.  Neurologic: Awake, alert, following commands  Skin: No lesions  Access: Right IJ PermCath    Basic Metabolic Panel: Recent Labs  Lab 12/16/19 0707 12/17/19 0623 12/20/19 0504  NA 136 134* 136  K 4.1 4.3 4.4  CL 100 99 95*  CO2 25 22 29   GLUCOSE 109* 125* 105*  BUN 47* 57* 55*  CREATININE 4.77* 5.41* 5.36*  CALCIUM 8.8* 8.7* 9.3  PHOS  --  3.3 4.5    Liver Function Tests: Recent Labs  Lab 12/16/19 0707 12/17/19 0623 12/20/19 0504  AST 19  --   --   ALT 11  --   --   ALKPHOS 210*  --   --   BILITOT 0.8  --   --   PROT 6.3*  --   --   ALBUMIN 1.9* 1.8* 2.0*   No results for input(s): LIPASE, AMYLASE in the last 168 hours. No results for input(s): AMMONIA in the last 168 hours.  CBC: Recent Labs  Lab 12/16/19 0707 12/17/19 1109 12/20/19 0504  WBC 6.7 7.2 7.9  HGB 9.7* 9.4* 9.9*  HCT 31.4* 30.9* 32.1*  MCV 99.4 98.1 100.3*  PLT 189 209 217    Cardiac Enzymes: No results for input(s): CKTOTAL, CKMB, CKMBINDEX, TROPONINI in the last 168 hours.  BNP: Invalid input(s): POCBNP  CBG: No results for input(s): GLUCAP in the last 168 hours.  Microbiology: Results for orders placed or performed during the hospital encounter of 12/31/2019  C difficile quick scan w PCR reflex     Status: None   Collection Time: 12/18/19  4:49 AM   Specimen: STOOL  Result Value Ref Range  Status   C Diff antigen NEGATIVE NEGATIVE Final   C Diff toxin NEGATIVE NEGATIVE Final   C Diff interpretation No C. difficile detected.  Corrected    Comment: Performed at [facility], [place] [address] [place], [place] [address] CORRECTED ON 03/13 AT 1510: PREVIOUSLY REPORTED AS VALID     Coagulation Studies: No results for input(s): LABPROT, INR in the last 72 hours.  Urinalysis: No results for input(s): COLORURINE, LABSPEC, PHURINE, GLUCOSEU, HGBUR, BILIRUBINUR, KETONESUR, PROTEINUR, UROBILINOGEN, NITRITE, LEUKOCYTESUR in the last 72 hours.  Invalid input(s): APPERANCEUR    Imaging: No results found.   Medications:     iohexol  Assessment/ Plan:  69 y.o. male with a PMHx of ESRD on HD, CHF, aortic stenosis, hypertension, diabetes mellitus type 2, hyperlipidemia, obstructive sleep apnea, chronic venous stasis with LE edema, and obesity who was admitted to [facility] on 12/26/2019 for ongoing treatment of respiratory failure, ESRD, malnutrition, and generalized debility.    1.  ESRD on HD MWF.    We will maintain the patient on MWF dialysis schedule.  Ultrafiltration target 1.5 kg today.  2.  Acute respiratory failure.    Patient currently transitioned off the ventilator on aerosolized trach collar.  Tolerating well.  3.  Secondary hyperparathyroidism.    Repeat  serum phosphorus today.  4.  Anemia of chronic kidney disease.  Hemoglobin 9.9 at last check.  Maintain the patient on Retacrit 4000 units IV with dialysis treatments.  Repeat CBC today.   LOS: 0 Erikah Thumm 3/17/202110:46 AM

## 2019-12-22 NOTE — Progress Notes (Signed)
Pulmonary Critical Care Medicine [place]   PULMONARY CRITICAL CARE SERVICE  PROGRESS NOTE  Date of Service: 12/22/2019  Jose Tucker  WER:[id number]  DOB: [birth year]   DOA: 12/14/2019  Referring Physician: Merton Border, MD  HPI: Jose Tucker is a 69 y.o. male seen for follow up of Acute on Chronic Respiratory Failure.  Patient at this time is on T collar has been on 35% FiO2 the goal is for 20 hours  Medications: Reviewed on Rounds  Physical Exam:  Vitals: Temperature 96.5 pulse 73 respiratory 27 blood pressure is 118/60 saturations 98%  Ventilator Settings on T collar with an FiO2 of 35% goal 20 hours  . General: Comfortable at this time . Eyes: Grossly normal lids, irises & conjunctiva . ENT: grossly tongue is normal . Neck: no obvious mass . Cardiovascular: S1 S2 normal no gallop . Respiratory: No rhonchi coarse breath sounds . Abdomen: soft . Skin: no rash seen on limited exam . Musculoskeletal: not rigid . Psychiatric:unable to assess . Neurologic: no seizure no involuntary movements         Lab Data:   Basic Metabolic Panel: Recent Labs  Lab 12/16/19 0707 12/17/19 0623 12/20/19 0504  NA 136 134* 136  K 4.1 4.3 4.4  CL 100 99 95*  CO2 25 22 29   GLUCOSE 109* 125* 105*  BUN 47* 57* 55*  CREATININE 4.77* 5.41* 5.36*  CALCIUM 8.8* 8.7* 9.3  PHOS  --  3.3 4.5    ABG: Recent Labs  Lab 12/25/2019 2320  PHART 7.388  PCO2ART 44.7  PO2ART 99.2  HCO3 26.5  O2SAT 98.8    Liver Function Tests: Recent Labs  Lab 12/16/19 0707 12/17/19 0623 12/20/19 0504  AST 19  --   --   ALT 11  --   --   ALKPHOS 210*  --   --   BILITOT 0.8  --   --   PROT 6.3*  --   --   ALBUMIN 1.9* 1.8* 2.0*   No results for input(s): LIPASE, AMYLASE in the last 168 hours. No results for input(s): AMMONIA in the last 168 hours.  CBC: Recent Labs  Lab 12/16/19 0707 12/17/19 1109 12/20/19 0504  WBC 6.7 7.2 7.9  HGB 9.7* 9.4* 9.9*  HCT 31.4* 30.9*  32.1*  MCV 99.4 98.1 100.3*  PLT 189 209 217    Cardiac Enzymes: No results for input(s): CKTOTAL, CKMB, CKMBINDEX, TROPONINI in the last 168 hours.  BNP (last 3 results) No results for input(s): BNP in the last 8760 hours.  ProBNP (last 3 results) No results for input(s): PROBNP in the last 8760 hours.  Radiological Exams: No results found.  Assessment/Plan Active Problems:   Acute on chronic respiratory failure with hypoxia (HCC)   Chronic systolic (congestive) heart failure (HCC)   End stage renal disease on dialysis [facility])   Chronic atrial fibrillation (HCC)   Bilateral pleural effusion   1. Acute on chronic respiratory failure with hypoxia patient currently is on T collar has been on 35% FiO2 we will continue to advance the wean today's goal for 20-hour goal 2. Chronic systolic heart failure right now is compensated we will continue present management 3. End-stage renal disease on dialysis 4. Chronic atrial fibrillation rate controlled 5. Bilateral pleural effusion   I have personally seen and evaluated the patient, evaluated laboratory and imaging results, formulated the assessment and plan and placed orders. The Patient requires high complexity decision making with multiple systems involvement.  Rounds  were done with the Respiratory Therapy Director and Staff therapists and discussed with nursing staff also.  Allyne Gee, MD [facility] Pulmonary Critical Care Medicine Sleep Medicine

## 2019-12-23 ENCOUNTER — Other Ambulatory Visit (HOSPITAL_COMMUNITY): Payer: Medicare Other

## 2019-12-23 DIAGNOSIS — I482 Chronic atrial fibrillation, unspecified: Secondary | ICD-10-CM | POA: Diagnosis not present

## 2019-12-23 DIAGNOSIS — J9621 Acute and chronic respiratory failure with hypoxia: Secondary | ICD-10-CM | POA: Diagnosis not present

## 2019-12-23 DIAGNOSIS — J90 Pleural effusion, not elsewhere classified: Secondary | ICD-10-CM | POA: Diagnosis not present

## 2019-12-23 DIAGNOSIS — I5022 Chronic systolic (congestive) heart failure: Secondary | ICD-10-CM | POA: Diagnosis not present

## 2019-12-23 NOTE — Progress Notes (Addendum)
Pulmonary Critical Care Medicine [place]   PULMONARY CRITICAL CARE SERVICE  PROGRESS NOTE  Date of Service: 12/23/2019  Jose Tucker  ONG:[id number]  DOB: Feb 01, 1951   DOA: 12/08/2019  Referring Physician: Merton Border, MD  HPI: Jose Tucker is a 69 y.o. male seen for follow up of Acute on Chronic Respiratory Failure.  Patient will continue on full support on the ventilator at this time currently on rate of 22 with an FiO2 40% satting well currently.  Medications: Reviewed on Rounds  Physical Exam:  Vitals: Pulse 92 respirations 21 BP 121/87 O2 sat 98% temp 99.4  Ventilator Settings ventilator mode AC VC rate 22 tidal line 500 PEEP of 5 FiO2 40%  . General: Comfortable at this time . Eyes: Grossly normal lids, irises & conjunctiva . ENT: grossly tongue is normal . Neck: no obvious mass . Cardiovascular: S1 S2 normal no gallop . Respiratory: No rales or rhonchi noted . Abdomen: soft . Skin: no rash seen on limited exam . Musculoskeletal: not rigid . Psychiatric:unable to assess . Neurologic: no seizure no involuntary movements         Lab Data:   Basic Metabolic Panel: Recent Labs  Lab 12/17/19 0623 12/20/19 0504 12/22/19 0949  NA 134* 136 132*  K 4.3 4.4 3.9  CL 99 95* 93*  CO2 22 29 27   GLUCOSE 125* 105* 129*  BUN 57* 55* 48*  CREATININE 5.41* 5.36* 4.55*  CALCIUM 8.7* 9.3 9.3  PHOS 3.3 4.5 4.9*    ABG: No results for input(s): PHART, PCO2ART, PO2ART, HCO3, O2SAT in the last 168 hours.  Liver Function Tests: Recent Labs  Lab 12/17/19 0623 12/20/19 0504 12/22/19 0949  ALBUMIN 1.8* 2.0* 2.5*   No results for input(s): LIPASE, AMYLASE in the last 168 hours. No results for input(s): AMMONIA in the last 168 hours.  CBC: Recent Labs  Lab 12/17/19 1109 12/20/19 0504 12/22/19 0949 12/23/19 1855  WBC 7.2 7.9 9.5 25.6*  HGB 9.4* 9.9* 10.0* 9.2*  HCT 30.9* 32.1* 33.3* 30.5*  MCV 98.1 100.3* 99.7 98.7  PLT 209 217 222 223     Cardiac Enzymes: No results for input(s): CKTOTAL, CKMB, CKMBINDEX, TROPONINI in the last 168 hours.  BNP (last 3 results) No results for input(s): BNP in the last 8760 hours.  ProBNP (last 3 results) No results for input(s): PROBNP in the last 8760 hours.  Radiological Exams: DG Chest Port 1 View  Result Date: 12/23/2019 CLINICAL DATA:  Possible aspiration. EXAM: PORTABLE CHEST 1 VIEW COMPARISON:  12/21/2019 FINDINGS: Tracheostomy tube in adequate position. Right subclavian dialysis catheter unchanged. Lung bases are cut off the film. There is hazy bilateral perihilar opacification unchanged to slightly worse and likely due to interstitial edema and less likely infection. Stable cardiomegaly. Remainder the exam is unchanged. IMPRESSION: Stable cardiomegaly with stable to worsening hazy bilateral perihilar opacification likely interstitial edema and less likely infection. Tubes and lines as described. Electronically Signed   By: Marin Olp M.D.   On: 12/23/2019 12:21   DG Abd Portable 1V  Result Date: 12/23/2019 CLINICAL DATA:  Vomiting.  Loose stools. EXAM: PORTABLE ABDOMEN - 1 VIEW COMPARISON:  12/14/2019. FINDINGS: Peg tube noted with tip over the stomach. A large cylindrical radiopacity noted over the right lower chest and upper abdomen. Tubing noted over the right abdomen. Surgical clips noted over the upper abdomen. Slightly prominent air-filled loops of small large bowel. Adynamic ileus cannot be excluded. No free air. Aortoiliac atherosclerotic vascular calcification. Bibasilar  atelectasis. Right pleural effusion noted. Left pleural effusion may be present. IMPRESSION: 1.  Peg tube noted with tip over the stomach. 2. Slightly prominent air-filled loops of small and large bowel. Adynamic ileus cannot be excluded. 3. Bibasilar atelectasis. Right pleural effusion noted. Left pleural effusion cannot be excluded. Electronically Signed   By: Marcello Moores  Register   On: 12/23/2019 07:37     Assessment/Plan Active Problems:   Acute on chronic respiratory failure with hypoxia (HCC)   Chronic systolic (congestive) heart failure (HCC)   End stage renal disease on dialysis [facility])   Chronic atrial fibrillation (HCC)   Bilateral pleural effusion   1. Acute on chronic respiratory failure with hypoxia patient currently on full support due to a vomiting/aspiration event.  Continue supportive measures and pulmonary toilet as time.  Continue full support only. 2. Chronic systolic heart failure right now is compensated we will continue present management 3. End-stage renal disease on dialysis 4. Chronic atrial fibrillation rate controlled 5. Bilateral pleural effusion   I have personally seen and evaluated the patient, evaluated laboratory and imaging results, formulated the assessment and plan and placed orders. The Patient requires high complexity decision making with multiple systems involvement.  Rounds were done with the Respiratory Therapy Director and Staff therapists and discussed with nursing staff also.  Allyne Gee, MD [facility] Pulmonary Critical Care Medicine Sleep Medicine

## 2019-12-24 ENCOUNTER — Other Ambulatory Visit (HOSPITAL_COMMUNITY): Payer: Medicare Other

## 2019-12-24 DIAGNOSIS — J90 Pleural effusion, not elsewhere classified: Secondary | ICD-10-CM | POA: Diagnosis not present

## 2019-12-24 DIAGNOSIS — I482 Chronic atrial fibrillation, unspecified: Secondary | ICD-10-CM | POA: Diagnosis not present

## 2019-12-24 DIAGNOSIS — I5022 Chronic systolic (congestive) heart failure: Secondary | ICD-10-CM | POA: Diagnosis not present

## 2019-12-24 DIAGNOSIS — J9621 Acute and chronic respiratory failure with hypoxia: Secondary | ICD-10-CM | POA: Diagnosis not present

## 2019-12-24 LAB — CBC
HCT: 31.4 % — ABNORMAL LOW (ref 39.0–52.0)
HCT: 30.9 % — ABNORMAL LOW (ref 39.0–52.0)
HCT: 32.1 % — ABNORMAL LOW (ref 39.0–52.0)
HCT: 33.3 % — ABNORMAL LOW (ref 39.0–52.0)
HCT: 30.5 % — ABNORMAL LOW (ref 39.0–52.0)
HCT: 28.5 % — ABNORMAL LOW (ref 39.0–52.0)
Hemoglobin: 9.7 g/dL — ABNORMAL LOW (ref 13.0–17.0)
Hemoglobin: 9.4 g/dL — ABNORMAL LOW (ref 13.0–17.0)
Hemoglobin: 9.9 g/dL — ABNORMAL LOW (ref 13.0–17.0)
Hemoglobin: 10 g/dL — ABNORMAL LOW (ref 13.0–17.0)
Hemoglobin: 9.2 g/dL — ABNORMAL LOW (ref 13.0–17.0)
Hemoglobin: 8.8 g/dL — ABNORMAL LOW (ref 13.0–17.0)
MCH: 30.7 pg (ref 26.0–34.0)
MCH: 29.8 pg (ref 26.0–34.0)
MCH: 30.9 pg (ref 26.0–34.0)
MCH: 29.9 pg (ref 26.0–34.0)
MCH: 29.8 pg (ref 26.0–34.0)
MCH: 30 pg (ref 26.0–34.0)
MCHC: 30.9 g/dL (ref 30.0–36.0)
MCHC: 30.4 g/dL (ref 30.0–36.0)
MCHC: 30.8 g/dL (ref 30.0–36.0)
MCHC: 30 g/dL (ref 30.0–36.0)
MCHC: 30.2 g/dL (ref 30.0–36.0)
MCHC: 30.9 g/dL (ref 30.0–36.0)
MCV: 99.4 fL (ref 80.0–100.0)
MCV: 98.1 fL (ref 80.0–100.0)
MCV: 100.3 fL — ABNORMAL HIGH (ref 80.0–100.0)
MCV: 99.7 fL (ref 80.0–100.0)
MCV: 98.7 fL (ref 80.0–100.0)
MCV: 97.3 fL (ref 80.0–100.0)
Platelets: 189 10*3/uL (ref 150–400)
Platelets: 209 10*3/uL (ref 150–400)
Platelets: 217 10*3/uL (ref 150–400)
Platelets: 222 10*3/uL (ref 150–400)
Platelets: 223 10*3/uL (ref 150–400)
Platelets: 224 10*3/uL (ref 150–400)
RBC: 3.16 MIL/uL — ABNORMAL LOW (ref 4.22–5.81)
RBC: 3.15 MIL/uL — ABNORMAL LOW (ref 4.22–5.81)
RBC: 3.2 MIL/uL — ABNORMAL LOW (ref 4.22–5.81)
RBC: 3.34 MIL/uL — ABNORMAL LOW (ref 4.22–5.81)
RBC: 3.09 MIL/uL — ABNORMAL LOW (ref 4.22–5.81)
RBC: 2.93 MIL/uL — ABNORMAL LOW (ref 4.22–5.81)
RDW: 17.1 % — ABNORMAL HIGH (ref 11.5–15.5)
RDW: 17.1 % — ABNORMAL HIGH (ref 11.5–15.5)
RDW: 16.8 % — ABNORMAL HIGH (ref 11.5–15.5)
RDW: 16.4 % — ABNORMAL HIGH (ref 11.5–15.5)
RDW: 16.7 % — ABNORMAL HIGH (ref 11.5–15.5)
RDW: 16.7 % — ABNORMAL HIGH (ref 11.5–15.5)
WBC: 6.7 10*3/uL (ref 4.0–10.5)
WBC: 7.2 10*3/uL (ref 4.0–10.5)
WBC: 7.9 10*3/uL (ref 4.0–10.5)
WBC: 9.5 10*3/uL (ref 4.0–10.5)
WBC: 25.6 10*3/uL — ABNORMAL HIGH (ref 4.0–10.5)
WBC: 21.8 10*3/uL — ABNORMAL HIGH (ref 4.0–10.5)
nRBC: 0 % (ref 0.0–0.2)
nRBC: 0 % (ref 0.0–0.2)
nRBC: 0 % (ref 0.0–0.2)
nRBC: 0 % (ref 0.0–0.2)
nRBC: 0 % (ref 0.0–0.2)
nRBC: 0 % (ref 0.0–0.2)

## 2019-12-24 LAB — RENAL FUNCTION PANEL
Albumin: 1.8 g/dL — ABNORMAL LOW (ref 3.5–5.0)
Albumin: 2 g/dL — ABNORMAL LOW (ref 3.5–5.0)
Albumin: 2.5 g/dL — ABNORMAL LOW (ref 3.5–5.0)
Albumin: 2 g/dL — ABNORMAL LOW (ref 3.5–5.0)
Anion gap: 13 (ref 5–15)
Anion gap: 12 (ref 5–15)
Anion gap: 12 (ref 5–15)
Anion gap: 12 (ref 5–15)
BUN: 57 mg/dL — ABNORMAL HIGH (ref 8–23)
BUN: 55 mg/dL — ABNORMAL HIGH (ref 8–23)
BUN: 48 mg/dL — ABNORMAL HIGH (ref 8–23)
BUN: 42 mg/dL — ABNORMAL HIGH (ref 8–23)
CO2: 22 mmol/L (ref 22–32)
CO2: 29 mmol/L (ref 22–32)
CO2: 27 mmol/L (ref 22–32)
CO2: 26 mmol/L (ref 22–32)
Calcium: 8.7 mg/dL — ABNORMAL LOW (ref 8.9–10.3)
Calcium: 9.3 mg/dL (ref 8.9–10.3)
Calcium: 9.3 mg/dL (ref 8.9–10.3)
Calcium: 8.5 mg/dL — ABNORMAL LOW (ref 8.9–10.3)
Chloride: 99 mmol/L (ref 98–111)
Chloride: 95 mmol/L — ABNORMAL LOW (ref 98–111)
Chloride: 93 mmol/L — ABNORMAL LOW (ref 98–111)
Chloride: 95 mmol/L — ABNORMAL LOW (ref 98–111)
Creatinine, Ser: 5.41 mg/dL — ABNORMAL HIGH (ref 0.61–1.24)
Creatinine, Ser: 5.36 mg/dL — ABNORMAL HIGH (ref 0.61–1.24)
Creatinine, Ser: 4.55 mg/dL — ABNORMAL HIGH (ref 0.61–1.24)
Creatinine, Ser: 3.97 mg/dL — ABNORMAL HIGH (ref 0.61–1.24)
GFR calc Af Amer: 12 mL/min — ABNORMAL LOW (ref >60)
GFR calc Af Amer: 12 mL/min — ABNORMAL LOW (ref >60)
GFR calc Af Amer: 11 mL/min — ABNORMAL LOW (ref >60)
GFR calc Af Amer: 13 mL/min — ABNORMAL LOW (ref >60)
GFR calc non Af Amer: 10 mL/min — ABNORMAL LOW (ref >60)
GFR calc non Af Amer: 10 mL/min — ABNORMAL LOW (ref >60)
GFR calc non Af Amer: 9 mL/min — ABNORMAL LOW (ref >60)
GFR calc non Af Amer: 11 mL/min — ABNORMAL LOW (ref >60)
Glucose, Bld: 125 mg/dL — ABNORMAL HIGH (ref 70–99)
Glucose, Bld: 105 mg/dL — ABNORMAL HIGH (ref 70–99)
Glucose, Bld: 129 mg/dL — ABNORMAL HIGH (ref 70–99)
Glucose, Bld: 162 mg/dL — ABNORMAL HIGH (ref 70–99)
Phosphorus: 3.3 mg/dL (ref 2.5–4.6)
Phosphorus: 4.5 mg/dL (ref 2.5–4.6)
Phosphorus: 4.9 mg/dL — ABNORMAL HIGH (ref 2.5–4.6)
Phosphorus: 2.2 mg/dL — ABNORMAL LOW (ref 2.5–4.6)
Potassium: 4.3 mmol/L (ref 3.5–5.1)
Potassium: 4.4 mmol/L (ref 3.5–5.1)
Potassium: 3.9 mmol/L (ref 3.5–5.1)
Potassium: 3.7 mmol/L (ref 3.5–5.1)
Sodium: 134 mmol/L — ABNORMAL LOW (ref 135–145)
Sodium: 136 mmol/L (ref 135–145)
Sodium: 132 mmol/L — ABNORMAL LOW (ref 135–145)
Sodium: 133 mmol/L — ABNORMAL LOW (ref 135–145)

## 2019-12-24 LAB — COMPREHENSIVE METABOLIC PANEL
ALT: 11 U/L (ref 0–44)
AST: 19 U/L (ref 15–41)
Albumin: 1.9 g/dL — ABNORMAL LOW (ref 3.5–5.0)
Alkaline Phosphatase: 210 U/L — ABNORMAL HIGH (ref 38–126)
Anion gap: 11 (ref 5–15)
BUN: 47 mg/dL — ABNORMAL HIGH (ref 8–23)
CO2: 25 mmol/L (ref 22–32)
Calcium: 8.8 mg/dL — ABNORMAL LOW (ref 8.9–10.3)
Chloride: 100 mmol/L (ref 98–111)
Creatinine, Ser: 4.77 mg/dL — ABNORMAL HIGH (ref 0.61–1.24)
GFR calc Af Amer: 13 mL/min — ABNORMAL LOW (ref >60)
GFR calc non Af Amer: 12 mL/min — ABNORMAL LOW (ref >60)
Glucose, Bld: 109 mg/dL — ABNORMAL HIGH (ref 70–99)
Potassium: 4.1 mmol/L (ref 3.5–5.1)
Sodium: 136 mmol/L (ref 135–145)
Total Bilirubin: 0.8 mg/dL (ref 0.3–1.2)
Total Protein: 6.3 g/dL — ABNORMAL LOW (ref 6.5–8.1)

## 2019-12-24 LAB — HEPATITIS B SURFACE ANTIBODY, QUANTITATIVE: Hep B S AB Quant (Post): 28.3 m[IU]/mL (ref >9.9)

## 2019-12-24 NOTE — Progress Notes (Addendum)
Pulmonary Critical Care Medicine [place]   PULMONARY CRITICAL CARE SERVICE  PROGRESS NOTE  Date of Service: 12/24/2019  Jose Tucker  WFU:[id number]  DOB: [birth year]   DOA: 01/04/2020  Referring Physician: Merton Border, MD  HPI: Jose Tucker is a 69 y.o. male seen for follow up of Acute on Chronic Respiratory Failure.  Patient remains on full support ventilator at this time rate 22 with an FiO2 40% satting well distress.  Medications: Reviewed on Rounds  Physical Exam:  Vitals: Pulse 78 respirations 20 BP 128/57 O2 sat 99% temp 97 point  Ventilator Settings ventilator mode AC VC rate 22,500 PEEP of 5 FiO2 40%  . General: Comfortable at this time . Eyes: Grossly normal lids, irises & conjunctiva . ENT: grossly tongue is normal . Neck: no obvious mass . Cardiovascular: S1 S2 normal no gallop . Respiratory: No rales or rhonchi noted . Abdomen: soft . Skin: no rash seen on limited exam . Musculoskeletal: not rigid . Psychiatric:unable to assess . Neurologic: no seizure no involuntary movements         Lab Data:   Basic Metabolic Panel: Recent Labs  Lab 12/20/19 0504 12/22/19 0949 12/24/19 0421  NA 136 132* 133*  K 4.4 3.9 3.7  CL 95* 93* 95*  CO2 29 27 26   GLUCOSE 105* 129* 162*  BUN 55* 48* 42*  CREATININE 5.36* 4.55* 3.97*  CALCIUM 9.3 9.3 8.5*  PHOS 4.5 4.9* 2.2*    ABG: No results for input(s): PHART, PCO2ART, PO2ART, HCO3, O2SAT in the last 168 hours.  Liver Function Tests: Recent Labs  Lab 12/20/19 0504 12/22/19 0949 12/24/19 0421  ALBUMIN 2.0* 2.5* 2.0*   No results for input(s): LIPASE, AMYLASE in the last 168 hours. No results for input(s): AMMONIA in the last 168 hours.  CBC: Recent Labs  Lab 12/17/19 1109 12/20/19 0504 12/22/19 0949 12/23/19 1855 12/24/19 0421  WBC 7.2 7.9 9.5 25.6* 21.8*  HGB 9.4* 9.9* 10.0* 9.2* 8.8*  HCT 30.9* 32.1* 33.3* 30.5* 28.5*  MCV 98.1 100.3* 99.7 98.7 97.3  PLT 209 217 222 223 224     Cardiac Enzymes: No results for input(s): CKTOTAL, CKMB, CKMBINDEX, TROPONINI in the last 168 hours.  BNP (last 3 results) No results for input(s): BNP in the last 8760 hours.  ProBNP (last 3 results) No results for input(s): PROBNP in the last 8760 hours.  Radiological Exams: DG Chest Port 1 View  Result Date: 12/23/2019 CLINICAL DATA:  Possible aspiration. EXAM: PORTABLE CHEST 1 VIEW COMPARISON:  12/13/2019 FINDINGS: Tracheostomy tube in adequate position. Right subclavian dialysis catheter unchanged. Lung bases are cut off the film. There is hazy bilateral perihilar opacification unchanged to slightly worse and likely due to interstitial edema and less likely infection. Stable cardiomegaly. Remainder the exam is unchanged. IMPRESSION: Stable cardiomegaly with stable to worsening hazy bilateral perihilar opacification likely interstitial edema and less likely infection. Tubes and lines as described. Electronically Signed   By: Marin Olp M.D.   On: 12/23/2019 12:21   DG Abd Portable 1V  Result Date: 12/24/2019 CLINICAL DATA:  Ileus EXAM: PORTABLE ABDOMEN - 1 VIEW COMPARISON:  Yesterday FINDINGS: Cardiomegaly with haziness of the lower chest attributed to pleural effusions. There is dedicated chest x-ray comparison. There is less gaseous distension of bowel today. Percutaneous gastrostomy tube noted. IMPRESSION: Improved bowel distension with essentially normalized bowel gas pattern. Electronically Signed   By: Monte Fantasia M.D.   On: 12/24/2019 07:13   DG Abd Portable 1V  Result Date: 12/23/2019 CLINICAL DATA:  Vomiting.  Loose stools. EXAM: PORTABLE ABDOMEN - 1 VIEW COMPARISON:  12/27/2019. FINDINGS: Peg tube noted with tip over the stomach. A large cylindrical radiopacity noted over the right lower chest and upper abdomen. Tubing noted over the right abdomen. Surgical clips noted over the upper abdomen. Slightly prominent air-filled loops of small large bowel. Adynamic ileus  cannot be excluded. No free air. Aortoiliac atherosclerotic vascular calcification. Bibasilar atelectasis. Right pleural effusion noted. Left pleural effusion may be present. IMPRESSION: 1.  Peg tube noted with tip over the stomach. 2. Slightly prominent air-filled loops of small and large bowel. Adynamic ileus cannot be excluded. 3. Bibasilar atelectasis. Right pleural effusion noted. Left pleural effusion cannot be excluded. Electronically Signed   By: Marcello Moores  Register   On: 12/23/2019 07:37    Assessment/Plan Active Problems:   Acute on chronic respiratory failure with hypoxia (HCC)   Chronic systolic (congestive) heart failure (HCC)   End stage renal disease on dialysis [facility])   Chronic atrial fibrillation (HCC)   Bilateral pleural effusion   1. Acute on chronic respiratory failure with hypoxia continue full support ventilator at this time settings above.  Continue aggressive pulmonary toilet and supportive measures. 2. Chronic systolic heart failure right now is compensated we will continue present management 3. End-stage renal disease on dialysis 4. Chronic atrial fibrillation rate controlled 5. Bilateral pleural effusion   I have personally seen and evaluated the patient, evaluated laboratory and imaging results, formulated the assessment and plan and placed orders. The Patient requires high complexity decision making with multiple systems involvement.  Rounds were done with the Respiratory Therapy Director and Staff therapists and discussed with nursing staff also.  Allyne Gee, MD [facility] Pulmonary Critical Care Medicine Sleep Medicine

## 2019-12-24 NOTE — Progress Notes (Signed)
[facility]  ROUNDING NOTE   Subjective:  Patient currently back on the ventilator this a.m. Due for hemodialysis today. Has been tolerating treatments well.   Objective:  Vital signs in last 24 hours:  Temperature 97.2 pulse 78 respirations 20 blood pressure 128/57  Physical Exam: General: Chronically ill-appearing  Head: Normocephalic, atraumatic. Moist oral mucosal membranes  Eyes: Anicteric  Neck: Tracheostomy in place  Lungs:  Scattered rhonchi bilateral  Heart: S1S2 no rubs  Abdomen:  Soft, nontender, bowel sounds present  Extremities: Trace peripheral edema.  Neurologic: Awake, alert, following commands  Skin: No lesions  Access: Right IJ PermCath    Basic Metabolic Panel: Recent Labs  Lab 12/20/19 0504 12/22/19 0949 12/24/19 0421  NA 136 132* 133*  K 4.4 3.9 3.7  CL 95* 93* 95*  CO2 29 27 26   GLUCOSE 105* 129* 162*  BUN 55* 48* 42*  CREATININE 5.36* 4.55* 3.97*  CALCIUM 9.3 9.3 8.5*  PHOS 4.5 4.9* 2.2*    Liver Function Tests: Recent Labs  Lab 12/20/19 0504 12/22/19 0949 12/24/19 0421  ALBUMIN 2.0* 2.5* 2.0*   No results for input(s): LIPASE, AMYLASE in the last 168 hours. No results for input(s): AMMONIA in the last 168 hours.  CBC: Recent Labs  Lab 12/17/19 1109 12/20/19 0504 12/22/19 0949 12/23/19 1855 12/24/19 0421  WBC 7.2 7.9 9.5 25.6* 21.8*  HGB 9.4* 9.9* 10.0* 9.2* 8.8*  HCT 30.9* 32.1* 33.3* 30.5* 28.5*  MCV 98.1 100.3* 99.7 98.7 97.3  PLT 209 217 222 223 224    Cardiac Enzymes: No results for input(s): CKTOTAL, CKMB, CKMBINDEX, TROPONINI in the last 168 hours.  BNP: Invalid input(s): POCBNP  CBG: No results for input(s): GLUCAP in the last 168 hours.  Microbiology: Results for orders placed or performed during the hospital encounter of 12/10/2019  C difficile quick scan w PCR reflex     Status: None   Collection Time: 12/18/19  4:49 AM   Specimen: STOOL  Result Value Ref Range Status   C Diff antigen  NEGATIVE NEGATIVE Final   C Diff toxin NEGATIVE NEGATIVE Final   C Diff interpretation No C. difficile detected.  Corrected    Comment: Performed at [facility], [place] [address], [place], [place] [address] CORRECTED ON 03/13 AT 1510: PREVIOUSLY REPORTED AS VALID     Coagulation Studies: No results for input(s): LABPROT, INR in the last 72 hours.  Urinalysis: No results for input(s): COLORURINE, LABSPEC, PHURINE, GLUCOSEU, HGBUR, BILIRUBINUR, KETONESUR, PROTEINUR, UROBILINOGEN, NITRITE, LEUKOCYTESUR in the last 72 hours.  Invalid input(s): APPERANCEUR    Imaging: DG Chest Port 1 View  Result Date: 12/23/2019 CLINICAL DATA:  Possible aspiration. EXAM: PORTABLE CHEST 1 VIEW COMPARISON:  01/03/2020 FINDINGS: Tracheostomy tube in adequate position. Right subclavian dialysis catheter unchanged. Lung bases are cut off the film. There is hazy bilateral perihilar opacification unchanged to slightly worse and likely due to interstitial edema and less likely infection. Stable cardiomegaly. Remainder the exam is unchanged. IMPRESSION: Stable cardiomegaly with stable to worsening hazy bilateral perihilar opacification likely interstitial edema and less likely infection. Tubes and lines as described. Electronically Signed   By: Marin Olp M.D.   On: 12/23/2019 12:21   DG Abd Portable 1V  Result Date: 12/24/2019 CLINICAL DATA:  Ileus EXAM: PORTABLE ABDOMEN - 1 VIEW COMPARISON:  Yesterday FINDINGS: Cardiomegaly with haziness of the lower chest attributed to pleural effusions. There is dedicated chest x-ray comparison. There is less gaseous distension of bowel today. Percutaneous  gastrostomy tube noted. IMPRESSION: Improved bowel distension with essentially normalized bowel gas pattern. Electronically Signed   By: Monte Fantasia M.D.   On: 12/24/2019 07:13   DG Abd Portable 1V  Result Date: 12/23/2019 CLINICAL DATA:  Vomiting.  Loose stools. EXAM: PORTABLE ABDOMEN - 1 VIEW COMPARISON:   12/31/2019. FINDINGS: Peg tube noted with tip over the stomach. A large cylindrical radiopacity noted over the right lower chest and upper abdomen. Tubing noted over the right abdomen. Surgical clips noted over the upper abdomen. Slightly prominent air-filled loops of small large bowel. Adynamic ileus cannot be excluded. No free air. Aortoiliac atherosclerotic vascular calcification. Bibasilar atelectasis. Right pleural effusion noted. Left pleural effusion may be present. IMPRESSION: 1.  Peg tube noted with tip over the stomach. 2. Slightly prominent air-filled loops of small and large bowel. Adynamic ileus cannot be excluded. 3. Bibasilar atelectasis. Right pleural effusion noted. Left pleural effusion cannot be excluded. Electronically Signed   By: Marcello Moores  Register   On: 12/23/2019 07:37     Medications:     iohexol  Assessment/ Plan:  69 y.o. male with a PMHx of ESRD on HD, CHF, aortic stenosis, hypertension, diabetes mellitus type 2, hyperlipidemia, obstructive sleep apnea, chronic venous stasis with LE edema, and obesity who was admitted to [facility] on 01/01/2020 for ongoing treatment of respiratory failure, ESRD, malnutrition, and generalized debility.    1.  ESRD on HD MWF.    Patient due for dialysis treatment today.  Orders have been prepared.  Thereafter next treatment will be on Monday.  2.  Acute respiratory failure.    Patient now back on the ventilator this AM.  Appears to be tolerating ventilation well.  Weaning protocol as per pulmonary/critical care.  3.  Secondary hyperparathyroidism.    Phosphorus at last check was 2.2.  Continue to monitor.  4.  Anemia of chronic kidney disease.  Hemoglobin is dropped a bit to 8.8.  Patient on Retacrit 4000 units IV with dialysis.  Continue to monitor CBC.   LOS: 0 Zakhai Meisinger 3/19/20218:23 AM

## 2019-12-25 ENCOUNTER — Other Ambulatory Visit (HOSPITAL_COMMUNITY): Payer: Medicare Other

## 2019-12-25 LAB — CK TOTAL AND CKMB (NOT AT ARMC)
CK, MB: 13.3 ng/mL — ABNORMAL HIGH (ref 0.5–5.0)
Relative Index: INVALID (ref 0.0–2.5)
Total CK: 86 U/L (ref 49–397)

## 2019-12-25 LAB — BLOOD GAS, ARTERIAL
Acid-Base Excess: 0.1 mmol/L (ref 0.0–2.0)
Bicarbonate: 24.6 mmol/L (ref 20.0–28.0)
Drawn by: 243969
FIO2: 100
O2 Saturation: 99.5 %
Patient temperature: 37
pCO2 arterial: 43.2 mmHg (ref 32.0–48.0)
pH, Arterial: 7.375 (ref 7.350–7.450)
pO2, Arterial: 147 mmHg — ABNORMAL HIGH (ref 83.0–108.0)

## 2019-12-25 LAB — RENAL FUNCTION PANEL
Albumin: 2.1 g/dL — ABNORMAL LOW (ref 3.5–5.0)
Anion gap: 17 — ABNORMAL HIGH (ref 5–15)
BUN: 37 mg/dL — ABNORMAL HIGH (ref 8–23)
CO2: 23 mmol/L (ref 22–32)
Calcium: 8.9 mg/dL (ref 8.9–10.3)
Chloride: 99 mmol/L (ref 98–111)
Creatinine, Ser: 3.8 mg/dL — ABNORMAL HIGH (ref 0.61–1.24)
GFR calc Af Amer: 18 mL/min — ABNORMAL LOW (ref >60)
GFR calc non Af Amer: 15 mL/min — ABNORMAL LOW (ref >60)
Glucose, Bld: 94 mg/dL (ref 70–99)
Phosphorus: 2.1 mg/dL — ABNORMAL LOW (ref 2.5–4.6)
Potassium: 3.5 mmol/L (ref 3.5–5.1)
Sodium: 139 mmol/L (ref 135–145)

## 2019-12-25 LAB — CBC
HCT: 32.7 % — ABNORMAL LOW (ref 39.0–52.0)
Hemoglobin: 9.5 g/dL — ABNORMAL LOW (ref 13.0–17.0)
MCH: 30 pg (ref 26.0–34.0)
MCHC: 29.1 g/dL — ABNORMAL LOW (ref 30.0–36.0)
MCV: 103.2 fL — ABNORMAL HIGH (ref 80.0–100.0)
Platelets: 227 10*3/uL (ref 150–400)
RBC: 3.17 MIL/uL — ABNORMAL LOW (ref 4.22–5.81)
RDW: 16.8 % — ABNORMAL HIGH (ref 11.5–15.5)
WBC: 27.9 10*3/uL — ABNORMAL HIGH (ref 4.0–10.5)
nRBC: 0.1 % (ref 0.0–0.2)

## 2019-12-25 LAB — BLOOD GAS, VENOUS
Acid-base deficit: 2.1 mmol/L — ABNORMAL HIGH (ref 0.0–2.0)
Bicarbonate: 25 mmol/L (ref 20.0–28.0)
FIO2: 100
O2 Saturation: 31.7 %
Patient temperature: 37
pCO2, Ven: 66 mmHg — ABNORMAL HIGH (ref 44.0–60.0)
pH, Ven: 7.202 — ABNORMAL LOW (ref 7.250–7.430)

## 2019-12-25 LAB — MAGNESIUM: Magnesium: 1.9 mg/dL (ref 1.7–2.4)

## 2019-12-25 LAB — TRIGLYCERIDES: Triglycerides: 98 mg/dL (ref <150)

## 2019-12-26 ENCOUNTER — Other Ambulatory Visit (HOSPITAL_COMMUNITY): Payer: Medicare Other

## 2019-12-26 ENCOUNTER — Institutional Professional Consult (permissible substitution) (HOSPITAL_COMMUNITY): Payer: Medicare Other

## 2019-12-26 DIAGNOSIS — I5022 Chronic systolic (congestive) heart failure: Secondary | ICD-10-CM | POA: Diagnosis not present

## 2019-12-26 DIAGNOSIS — J9621 Acute and chronic respiratory failure with hypoxia: Secondary | ICD-10-CM | POA: Diagnosis not present

## 2019-12-26 DIAGNOSIS — I482 Chronic atrial fibrillation, unspecified: Secondary | ICD-10-CM | POA: Diagnosis not present

## 2019-12-26 DIAGNOSIS — J90 Pleural effusion, not elsewhere classified: Secondary | ICD-10-CM | POA: Diagnosis not present

## 2019-12-26 LAB — C DIFFICILE QUICK SCREEN W PCR REFLEX
C Diff antigen: POSITIVE — AB
C Diff interpretation: DETECTED
C Diff toxin: POSITIVE — AB

## 2019-12-26 LAB — CULTURE, RESPIRATORY W GRAM STAIN

## 2019-12-26 LAB — TROPONIN I (HIGH SENSITIVITY): Troponin I (High Sensitivity): 407 ng/L (ref <18)

## 2019-12-26 MED FILL — Medication: Qty: 1 | Status: AC

## 2019-12-26 NOTE — Progress Notes (Addendum)
Pulmonary Critical Care Medicine [place]   PULMONARY CRITICAL CARE SERVICE  PROGRESS NOTE  Date of Service: 12/26/2019  Jose Tucker  OBS:[id number]  DOB: 03-14-1951   DOA: 01/03/2020  Referring Physician: Merton Border, MD  HPI: Jose Tucker is a 69 y.o. male seen for follow up of Acute on Chronic Respiratory Failure.  Patient remains on full support at this time after having an episode where she coded yesterday.  Possible aspiration pneumonia.  Medications: Reviewed on Rounds  Physical Exam:  Vitals: Pulse 108 respirations 18 BP 145/61 O2 sat 100% temp 99.1  Ventilator Settings ventilator mode AC VC rate of 22 tidal volume 500 PEEP of 5 and FiO2 of 70%  . General: Comfortable at this time . Eyes: Grossly normal lids, irises & conjunctiva . ENT: grossly tongue is normal . Neck: no obvious mass . Cardiovascular: S1 S2 normal no gallop . Respiratory: Coarse breath sounds . Abdomen: soft . Skin: no rash seen on limited exam . Musculoskeletal: not rigid . Psychiatric:unable to assess . Neurologic: no seizure no involuntary movements         Lab Data:   Basic Metabolic Panel: Recent Labs  Lab 12/20/19 0504 12/22/19 0949 12/24/19 0421 12/25/19 1716  NA 136 132* 133* 139  K 4.4 3.9 3.7 3.5  CL 95* 93* 95* 99  CO2 29 27 26 23   GLUCOSE 105* 129* 162* 94  BUN 55* 48* 42* 37*  CREATININE 5.36* 4.55* 3.97* 3.80*  CALCIUM 9.3 9.3 8.5* 8.9  MG  --   --   --  1.9  PHOS 4.5 4.9* 2.2* 2.1*    ABG: Recent Labs  Lab 12/25/19 1716 12/25/19 2015  PHART  --  7.375  PCO2ART  --  43.2  PO2ART  --  147*  HCO3 25.0 24.6  O2SAT 31.7 99.5    Liver Function Tests: Recent Labs  Lab 12/20/19 0504 12/22/19 0949 12/24/19 0421 12/25/19 1716  ALBUMIN 2.0* 2.5* 2.0* 2.1*   No results for input(s): LIPASE, AMYLASE in the last 168 hours. No results for input(s): AMMONIA in the last 168 hours.  CBC: Recent Labs  Lab 12/20/19 0504 12/22/19 0949  12/23/19 1855 12/24/19 0421 12/25/19 1716  WBC 7.9 9.5 25.6* 21.8* 27.9*  HGB 9.9* 10.0* 9.2* 8.8* 9.5*  HCT 32.1* 33.3* 30.5* 28.5* 32.7*  MCV 100.3* 99.7 98.7 97.3 103.2*  PLT 217 222 223 224 227    Cardiac Enzymes: Recent Labs  Lab 12/25/19 1716  CKTOTAL 86  CKMB 13.3*    BNP (last 3 results) No results for input(s): BNP in the last 8760 hours.  ProBNP (last 3 results) No results for input(s): PROBNP in the last 8760 hours.  Radiological Exams: DG CHEST PORT 1 VIEW  Result Date: 12/26/2019 CLINICAL DATA:  69 year old male status post PICC placement. EXAM: PORTABLE CHEST 1 VIEW COMPARISON:  Chest x-ray 12/26/2019. FINDINGS: A tracheostomy tube is in place with tip 4.2 cm above the carina. Right subclavian PermCath with tips terminating in the distal superior vena cava and superior aspect of the right atrium. New left IJ central venous catheter with tip terminating in the mid superior vena cava. Lung volumes are low. Extensive bibasilar opacities which may reflect areas of atelectasis and/or consolidation. Moderate right and small left pleural effusions. Moderate cardiomegaly. The patient is rotated to the left on today's exam, resulting in distortion of the mediastinal contours and reduced diagnostic sensitivity and specificity for mediastinal pathology. Aortic atherosclerosis. IMPRESSION: 1. Support apparatus, as  above. 2. Low lung volumes with persistent bibasilar areas of atelectasis and/or consolidation with moderate right and small left pleural effusions. 3. Moderate cardiomegaly. 4. Aortic atherosclerosis. Electronically Signed   By: Vinnie Langton M.D.   On: 12/26/2019 12:43   DG CHEST PORT 1 VIEW  Result Date: 12/26/2019 CLINICAL DATA:  Pneumonia EXAM: PORTABLE CHEST 1 VIEW COMPARISON:  December 25, 2019 FINDINGS: The tracheostomy tube is stable. A right central line is stable. Stable cardiomegaly. Diffuse bilateral interstitial opacities. Hazy opacity in the right base,  likely layering effusion. Opacity in the left retrocardiac region obscures the left hemidiaphragm. No pneumothorax. No other acute abnormalities. IMPRESSION: 1. Support apparatus as above. 2. Probable mild pulmonary edema. 3. Probable layering effusion with underlying atelectasis on the right. 4. The opacity in left retrocardiac region may be atelectasis as well. Electronically Signed   By: Dorise Bullion III M.D   On: 12/26/2019 08:37   DG Chest Port 1 View  Result Date: 12/25/2019 CLINICAL DATA:  Status post CPR. Respiratory failure. EXAM: PORTABLE CHEST 1 VIEW COMPARISON:  12/23/2019 FINDINGS: Endotracheal tube is in place with tip 2.7 centimeters above the carina. RIGHT subclavian dual lumen central line tip overlies the superior vena cava. Heart is enlarged and stable in configuration. There are bibasilar opacities which completely obscure the hemidiaphragms have a stable in appearance. Findings are consistent with bilateral pleural effusions and bibasilar opacities. Perihilar opacities are consistent with pulmonary edema. IMPRESSION: 1. Stable appearance of the chest. 2. Pulmonary edema and bilateral pleural effusions. Electronically Signed   By: Nolon Nations M.D.   On: 12/25/2019 15:09    Assessment/Plan Active Problems:   Acute on chronic respiratory failure with hypoxia (HCC)   Chronic systolic (congestive) heart failure (HCC)   End stage renal disease on dialysis [facility])   Chronic atrial fibrillation (HCC)   Bilateral pleural effusion   1. Acute on chronic respiratory failure with hypoxia continue full support ventilator at this time settings above.  Continue aggressive pulmonary toilet and supportive measures. 2. Chronic systolic heart failure right now is compensated we will continue present management 3. End-stage renal disease on dialysis 4. Chronic atrial fibrillation rate controlled 5. Bilateral pleural effusion with pulmonary edema post code.   I have personally seen and  evaluated the patient, evaluated laboratory and imaging results, formulated the assessment and plan and placed orders. The Patient requires high complexity decision making with multiple systems involvement.  Rounds were done with the Respiratory Therapy Director and Staff therapists and discussed with nursing staff also.  Allyne Gee, MD [facility] Pulmonary Critical Care Medicine Sleep Medicine

## 2019-12-26 NOTE — Consult Note (Addendum)
Referring Physician: Dr. Arnold Long, MD  Jose Tucker is an 69 y.o. male.                       Chief Complaint: Atrial fibrillation with RVR  HPI: 69 years old white male is admitted with acute on chronic respiratory failure with hypoxemia. He has PMH of CHF, aortic stenosis, hypertension, type 2 DM, chronic atrial fibrillation, hyperlipidemia, OSA, chronic venous stasis with edema, obesity and ESRD. He is on pressure agent Levophed. He had atrial fibrillation with RVR treated with IV metoprolol and per tube amiodarone.  Past Medical History:  Diagnosis Date  . Acute on chronic respiratory failure with hypoxia ([place])   . Bilateral pleural effusion   . Chronic atrial fibrillation ([place])   . Chronic systolic (congestive) heart failure ([place])   . End stage renal disease on dialysis [facility])       The histories are not reviewed yet. Please review them in the "History" navigator section and refresh this [place].  No family history on file. Social History:  has no history on file for tobacco, alcohol, and drug.  Allergies: Not on File  No medications prior to admission.    Results for orders placed or performed during the hospital encounter of 12/16/2019 (from the past 48 hour(s))  Culture, respiratory (non-expectorated)     Status: None   Collection Time: 12/24/19  4:00 PM   Specimen: Tracheal Aspirate; Respiratory  Result Value Ref Range   Specimen Description TRACHEAL ASPIRATE    Special Requests NONE    Gram Stain      ABUNDANT WBC PRESENT, PREDOMINANTLY PMN ABUNDANT GRAM POSITIVE RODS FEW GRAM NEGATIVE RODS Performed at [facility], [place] [address] [place], [place] [address]    Culture MODERATE PROTEUS MIRABILIS    Report Status 12/26/2019 FINAL    Organism ID, Bacteria PROTEUS MIRABILIS       Susceptibility   Proteus mirabilis - MIC*    AMPICILLIN <=2 SENSITIVE Sensitive     CEFAZOLIN 8 SENSITIVE Sensitive     CEFEPIME <=0.12 SENSITIVE Sensitive     CEFTAZIDIME <=1  SENSITIVE Sensitive     CEFTRIAXONE <=0.25 SENSITIVE Sensitive     CIPROFLOXACIN <=0.25 SENSITIVE Sensitive     GENTAMICIN <=1 SENSITIVE Sensitive     IMIPENEM 4 SENSITIVE Sensitive     TRIMETH/SULFA <=20 SENSITIVE Sensitive     AMPICILLIN/SULBACTAM <=2 SENSITIVE Sensitive     PIP/TAZO <=4 SENSITIVE Sensitive     * MODERATE PROTEUS MIRABILIS  CBC     Status: Abnormal   Collection Time: 12/25/19  5:16 PM  Result Value Ref Range   WBC 27.9 (H) 4.0 - 10.5 K/uL   RBC 3.17 (L) 4.22 - 5.81 MIL/uL   Hemoglobin 9.5 (L) 13.0 - 17.0 g/dL   HCT 32.7 (L) 39.0 - 52.0 %   MCV 103.2 (H) 80.0 - 100.0 fL   MCH 30.0 26.0 - 34.0 pg   MCHC 29.1 (L) 30.0 - 36.0 g/dL   RDW 16.8 (H) 11.5 - 15.5 %   Platelets 227 150 - 400 K/uL   nRBC 0.1 0.0 - 0.2 %    Comment: Performed at [facility], [place] [address], [place], [place] [address]  Magnesium     Status: None   Collection Time: 12/25/19  5:16 PM  Result Value Ref Range   Magnesium 1.9 1.7 - 2.4 mg/dL    Comment: Performed at [facility]  [address], [place], [place] [address]  CK total and CKMB (cardiac)not at [facility]     Status: Abnormal   Collection Time: 12/25/19  5:16 PM  Result Value Ref Range   Total CK 86 49 - 397 U/L   CK, MB 13.3 (H) 0.5 - 5.0 ng/mL   Relative Index RELATIVE INDEX IS INVALID 0.0 - 2.5    Comment: WHEN CK < 100 U/L        Performed at [facility] [address], [place], [place] [address]   Culture, blood (routine x 2)     Status: None (Preliminary result)   Collection Time: 12/25/19  5:16 PM   Specimen: BLOOD  Result Value Ref Range   Specimen Description BLOOD RIGHT ANTECUBITAL    Special Requests      BOTTLES DRAWN AEROBIC AND ANAEROBIC Blood Culture adequate volume   Culture      NO GROWTH < 12 HOURS Performed at [facility], [place] [address] [place], [place] [address]    Report Status PENDING   Blood gas, venous     Status: Abnormal   Collection Time: 12/25/19  5:16 PM  Result Value  Ref Range   FIO2 100.00    pH, Ven 7.202 (L) 7.250 - 7.430   pCO2, Ven 66.0 (H) 44.0 - 60.0 mmHg   Bicarbonate 25.0 20.0 - 28.0 mmol/L   Acid-base deficit 2.1 (H) 0.0 - 2.0 mmol/L   O2 Saturation 31.7 %   Patient temperature 37.0     Comment: Performed at [facility] [address], [place], [place] [address]  Renal function panel     Status: Abnormal   Collection Time: 12/25/19  5:16 PM  Result Value Ref Range   Sodium 139 135 - 145 mmol/L   Potassium 3.5 3.5 - 5.1 mmol/L   Chloride 99 98 - 111 mmol/L   CO2 23 22 - 32 mmol/L   Glucose, Bld 94 70 - 99 mg/dL    Comment: Glucose reference range applies only to samples taken after fasting for at least 8 hours.   BUN 37 (H) 8 - 23 mg/dL   Creatinine, Ser 3.80 (H) 0.61 - 1.24 mg/dL   Calcium 8.9 8.9 - 10.3 mg/dL   Phosphorus 2.1 (L) 2.5 - 4.6 mg/dL   Albumin 2.1 (L) 3.5 - 5.0 g/dL   GFR calc non Af Amer 15 (L) >60 mL/min   GFR calc Af Amer 18 (L) >60 mL/min   Anion gap 17 (H) 5 - 15    Comment: Performed at [facility] [address] [place], [place] [address]  Triglycerides     Status: None   Collection Time: 12/25/19  5:16 PM  Result Value Ref Range   Triglycerides 98 <150 mg/dL    Comment: Performed at [facility] [address] [place], [place] [address]  Culture, blood (routine x 2)     Status: None (Preliminary result)   Collection Time: 12/25/19  5:18 PM   Specimen: BLOOD  Result Value Ref Range   Specimen Description BLOOD RIGHT ANTECUBITAL    Special Requests      BOTTLES DRAWN AEROBIC AND ANAEROBIC Blood Culture adequate volume   Culture      NO GROWTH < 12 HOURS Performed at [facility], [place] [address], [place], [place] [address]    Report Status PENDING   Blood gas, arterial     Status: Abnormal   Collection Time: 12/25/19  8:15 PM  Result Value Ref Range   FIO2 100.00    pH, Arterial 7.375 7.350 - 7.450   pCO2 arterial 43.2 32.0 - 48.0 mmHg   pO2, Arterial 147 (H) 83.0 - 108.0  mmHg   Bicarbonate 24.6 20.0 - 28.0 mmol/L   Acid-Base Excess 0.1 0.0 - 2.0 mmol/L   O2 Saturation 99.5 %   Patient temperature 37.0    Collection site RIGHT BRACHIAL    Drawn by [id number]     Comment: COLLECTED BY RT   Sample type ARTERIAL DRAW    Allens test (pass/fail) PASS PASS    Comment: Performed at [facility] [address] [place], [place] [address]  Troponin I (High Sensitivity)     Status: Abnormal   Collection Time: 12/25/19 11:17 PM  Result Value Ref Range   Troponin I (High Sensitivity) 407 (HH) <18 ng/L    Comment: CRITICAL RESULT CALLED TO, READ BACK BY AND VERIFIED WITH: Lorie Phenix RN [id number] M GARRETT (NOTE) Elevated high sensitivity troponin I (hsTnI) values and significant  changes across serial measurements may suggest ACS but many other  chronic and acute conditions are known to elevate hsTnI results.  Refer to the Links section for chest pain algorithms and additional  guidance. Performed at [facility], [place] [address], [place], [place] [address]    DG CHEST PORT 1 VIEW  Result Date: 12/26/2019 CLINICAL DATA:  Pneumonia EXAM: PORTABLE CHEST 1 VIEW COMPARISON:  December 25, 2019 FINDINGS: The tracheostomy tube is stable. A right central line is stable. Stable cardiomegaly. Diffuse bilateral interstitial opacities. Hazy opacity in the right base, likely layering effusion. Opacity in the left retrocardiac region obscures the left hemidiaphragm. No pneumothorax. No other acute abnormalities. IMPRESSION: 1. Support apparatus as above. 2. Probable mild pulmonary edema. 3. Probable layering effusion with underlying atelectasis on the right. 4. The opacity in left retrocardiac region may be atelectasis as well. Electronically Signed   By: Dorise Bullion III M.D   On: 12/26/2019 08:37   DG Chest Port 1 View  Result Date: 12/25/2019 CLINICAL DATA:  Status post CPR. Respiratory failure. EXAM: PORTABLE CHEST 1 VIEW COMPARISON:  12/23/2019 FINDINGS:  Endotracheal tube is in place with tip 2.7 centimeters above the carina. RIGHT subclavian dual lumen central line tip overlies the superior vena cava. Heart is enlarged and stable in configuration. There are bibasilar opacities which completely obscure the hemidiaphragms have a stable in appearance. Findings are consistent with bilateral pleural effusions and bibasilar opacities. Perihilar opacities are consistent with pulmonary edema. IMPRESSION: 1. Stable appearance of the chest. 2. Pulmonary edema and bilateral pleural effusions. Electronically Signed   By: Nolon Nations M.D.   On: 12/25/2019 15:09    Review Of Systems As per PMH.   P: 60, R: 30, BP: 122/60. T: 98.00 degree F. O2 sat 100 %.  There were no vitals taken for this visit. There is no height or weight on file to calculate BMI. General appearance: awake, cooperative, appears stated age and mild respiratory distress Head: Normocephalic, atraumatic. Eyes: Hazel eyes, pink conjunctiva, corneas clear. PERRL, EOM's intact. Neck: No adenopathy, no carotid bruit, no JVD, supple, symmetrical, trachea midline and thyroid not enlarged. Tracheostomy tube with ventilator support. Resp: Clearing to auscultation bilaterally. Cardio: Irregular rate and rhythm, S1, S2 normal, II/VI systolic murmur, no click, rub or gallop GI: Soft, non-tender; bowel sounds normal; no organomegaly. Extremities: Trace edema, significant venous stasis dermatosis of both lower legs.No cyanosis  or clubbing. Skin: Warm and dry.  Neurologic: Alert and oriented X 1, moves all 4 extremities..  Assessment/Plan Acute on chronic respiratory failure S/P tracheostomy tube with ventilator support ESRD with hemodialysis M-W-F. 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 Continue IV metoprolol/NG metoprolol + amiodarone as needed for  heart rate control. Agree with Echocardiogram for LV function/valvular heart disease.  Time spent: Review of old records, Lab, x-rays, EKG, other cardiac tests, examination, discussion with patient over 70 minutes.  Birdie Riddle, MD  12/26/2019, 11:37 AM

## 2019-12-27 ENCOUNTER — Other Ambulatory Visit (HOSPITAL_COMMUNITY): Payer: Medicare Other

## 2019-12-27 DIAGNOSIS — I482 Chronic atrial fibrillation, unspecified: Secondary | ICD-10-CM | POA: Diagnosis not present

## 2019-12-27 DIAGNOSIS — J90 Pleural effusion, not elsewhere classified: Secondary | ICD-10-CM | POA: Diagnosis not present

## 2019-12-27 DIAGNOSIS — I5022 Chronic systolic (congestive) heart failure: Secondary | ICD-10-CM | POA: Diagnosis not present

## 2019-12-27 DIAGNOSIS — J9621 Acute and chronic respiratory failure with hypoxia: Secondary | ICD-10-CM | POA: Diagnosis not present

## 2019-12-27 LAB — CBC
HCT: 31.9 % — ABNORMAL LOW (ref 39.0–52.0)
Hemoglobin: 9.8 g/dL — ABNORMAL LOW (ref 13.0–17.0)
MCH: 29.3 pg (ref 26.0–34.0)
MCHC: 30.7 g/dL (ref 30.0–36.0)
MCV: 95.2 fL (ref 80.0–100.0)
Platelets: 177 10*3/uL (ref 150–400)
RBC: 3.35 MIL/uL — ABNORMAL LOW (ref 4.22–5.81)
RDW: 16.5 % — ABNORMAL HIGH (ref 11.5–15.5)
WBC: 23 10*3/uL — ABNORMAL HIGH (ref 4.0–10.5)
nRBC: 0 % (ref 0.0–0.2)

## 2019-12-27 LAB — RENAL FUNCTION PANEL
Albumin: 1.8 g/dL — ABNORMAL LOW (ref 3.5–5.0)
Anion gap: 13 (ref 5–15)
BUN: 58 mg/dL — ABNORMAL HIGH (ref 8–23)
CO2: 24 mmol/L (ref 22–32)
Calcium: 8.8 mg/dL — ABNORMAL LOW (ref 8.9–10.3)
Chloride: 95 mmol/L — ABNORMAL LOW (ref 98–111)
Creatinine, Ser: 4.4 mg/dL — ABNORMAL HIGH (ref 0.61–1.24)
GFR calc Af Amer: 15 mL/min — ABNORMAL LOW (ref >60)
GFR calc non Af Amer: 13 mL/min — ABNORMAL LOW (ref >60)
Glucose, Bld: 145 mg/dL — ABNORMAL HIGH (ref 70–99)
Phosphorus: 1.8 mg/dL — ABNORMAL LOW (ref 2.5–4.6)
Potassium: 3.7 mmol/L (ref 3.5–5.1)
Sodium: 132 mmol/L — ABNORMAL LOW (ref 135–145)

## 2019-12-27 NOTE — Progress Notes (Signed)
[facility]  ROUNDING NOTE   Subjective:  Patient remains critically ill at the moment. Had CODE BLUE event over the weekend. Still on the ventilator at the moment. Cardiology following the patient as well.  Objective:  Vital signs in last 24 hours:  Temperature 97.4 pulse 107 respirations 26 blood pressure 133/79  Physical Exam: General: Chronically ill-appearing  Head: Normocephalic, atraumatic. Moist oral mucosal membranes  Eyes: Anicteric  Neck: Tracheostomy in place  Lungs:  Scattered rhonchi bilateral  Heart: S1S2 irregular  Abdomen:  Soft, nontender, bowel sounds present  Extremities: Trace peripheral edema.  Neurologic: Awake, alert, following commands  Skin: No lesions  Access: Right IJ PermCath    Basic Metabolic Panel: Recent Labs  Lab 12/22/19 0949 12/22/19 0949 12/24/19 0421 12/25/19 1716 12/27/19 0632  NA 132*  --  133* 139 132*  K 3.9  --  3.7 3.5 3.7  CL 93*  --  95* 99 95*  CO2 27  --  26 23 24   GLUCOSE 129*  --  162* 94 145*  BUN 48*  --  42* 37* 58*  CREATININE 4.55*  --  3.97* 3.80* 4.40*  CALCIUM 9.3   < > 8.5* 8.9 8.8*  MG  --   --   --  1.9  --   PHOS 4.9*  --  2.2* 2.1* 1.8*   < > = values in this interval not displayed.    Liver Function Tests: Recent Labs  Lab 12/22/19 0949 12/24/19 0421 12/25/19 1716 12/27/19 9604  ALBUMIN 2.5* 2.0* 2.1* 1.8*   No results for input(s): LIPASE, AMYLASE in the last 168 hours. No results for input(s): AMMONIA in the last 168 hours.  CBC: Recent Labs  Lab 12/22/19 0949 12/23/19 1855 12/24/19 0421 12/25/19 1716  WBC 9.5 25.6* 21.8* 27.9*  HGB 10.0* 9.2* 8.8* 9.5*  HCT 33.3* 30.5* 28.5* 32.7*  MCV 99.7 98.7 97.3 103.2*  PLT 222 223 224 227    Cardiac Enzymes: Recent Labs  Lab 12/25/19 1716  CKTOTAL 86  CKMB 13.3*    BNP: Invalid input(s): POCBNP  CBG: No results for input(s): GLUCAP in the last 168 hours.  Microbiology: Results for orders placed or performed  during the hospital encounter of 12/23/2019  C difficile quick scan w PCR reflex     Status: None   Collection Time: 12/18/19  4:49 AM   Specimen: STOOL  Result Value Ref Range Status   C Diff antigen NEGATIVE NEGATIVE Final   C Diff toxin NEGATIVE NEGATIVE Final   C Diff interpretation No C. difficile detected.  Corrected    Comment: Performed at [facility], [place] [address] [place], [place] [address] CORRECTED ON 03/13 AT 1510: PREVIOUSLY REPORTED AS VALID   Culture, respiratory (non-expectorated)     Status: None   Collection Time: 12/24/19  4:00 PM   Specimen: Tracheal Aspirate; Respiratory  Result Value Ref Range Status   Specimen Description TRACHEAL ASPIRATE  Final   Special Requests NONE  Final   Gram Stain   Final    ABUNDANT WBC PRESENT, PREDOMINANTLY PMN ABUNDANT GRAM POSITIVE RODS FEW GRAM NEGATIVE RODS Performed at [facility], [place] [address], [place], [place] [address]    Culture MODERATE PROTEUS MIRABILIS  Final   Report Status 12/26/2019 FINAL  Final   Organism ID, Bacteria PROTEUS MIRABILIS  Final      Susceptibility   Proteus mirabilis - MIC*    AMPICILLIN <=2 SENSITIVE Sensitive  CEFAZOLIN 8 SENSITIVE Sensitive     CEFEPIME <=0.12 SENSITIVE Sensitive     CEFTAZIDIME <=1 SENSITIVE Sensitive     CEFTRIAXONE <=0.25 SENSITIVE Sensitive     CIPROFLOXACIN <=0.25 SENSITIVE Sensitive     GENTAMICIN <=1 SENSITIVE Sensitive     IMIPENEM 4 SENSITIVE Sensitive     TRIMETH/SULFA <=20 SENSITIVE Sensitive     AMPICILLIN/SULBACTAM <=2 SENSITIVE Sensitive     PIP/TAZO <=4 SENSITIVE Sensitive     * MODERATE PROTEUS MIRABILIS  Culture, blood (routine x 2)     Status: None (Preliminary result)   Collection Time: 12/25/19  5:16 PM   Specimen: BLOOD  Result Value Ref Range Status   Specimen Description BLOOD RIGHT ANTECUBITAL  Final   Special Requests   Final    BOTTLES DRAWN AEROBIC AND ANAEROBIC Blood Culture adequate volume   Culture   Final    NO GROWTH  < 12 HOURS Performed at [facility], [place] [address], [place], [place] [address]    Report Status PENDING  Incomplete  Culture, blood (routine x 2)     Status: None (Preliminary result)   Collection Time: 12/25/19  5:18 PM   Specimen: BLOOD  Result Value Ref Range Status   Specimen Description BLOOD RIGHT ANTECUBITAL  Final   Special Requests   Final    BOTTLES DRAWN AEROBIC AND ANAEROBIC Blood Culture adequate volume   Culture   Final    NO GROWTH < 12 HOURS Performed at [facility], [place] [address] [place], [place] [address]    Report Status PENDING  Incomplete  C difficile quick scan w PCR reflex     Status: Abnormal   Collection Time: 12/26/19 12:11 PM   Specimen: STOOL  Result Value Ref Range Status   C Diff antigen POSITIVE (A) NEGATIVE Final   C Diff toxin POSITIVE (A) NEGATIVE Final    Comment: CRITICAL RESULT CALLED TO, READ BACK BY AND VERIFIED WITH:  RN CHRISSY R. @1817  12/26/2019 AKT    C Diff interpretation Toxin producing C. difficile detected.  Final    Coagulation Studies: No results for input(s): LABPROT, INR in the last 72 hours.  Urinalysis: No results for input(s): COLORURINE, LABSPEC, PHURINE, GLUCOSEU, HGBUR, BILIRUBINUR, KETONESUR, PROTEINUR, UROBILINOGEN, NITRITE, LEUKOCYTESUR in the last 72 hours.  Invalid input(s): APPERANCEUR    Imaging: DG CHEST PORT 1 VIEW  Result Date: 12/26/2019 CLINICAL DATA:  69 year old male status post PICC placement. EXAM: PORTABLE CHEST 1 VIEW COMPARISON:  Chest x-ray 12/26/2019. FINDINGS: A tracheostomy tube is in place with tip 4.2 cm above the carina. Right subclavian PermCath with tips terminating in the distal superior vena cava and superior aspect of the right atrium. New left IJ central venous catheter with tip terminating in the mid superior vena cava. Lung volumes are low. Extensive bibasilar opacities which may reflect areas of atelectasis and/or consolidation. Moderate right and small left pleural  effusions. Moderate cardiomegaly. The patient is rotated to the left on today's exam, resulting in distortion of the mediastinal contours and reduced diagnostic sensitivity and specificity for mediastinal pathology. Aortic atherosclerosis. IMPRESSION: 1. Support apparatus, as above. 2. Low lung volumes with persistent bibasilar areas of atelectasis and/or consolidation with moderate right and small left pleural effusions. 3. Moderate cardiomegaly. 4. Aortic atherosclerosis. Electronically Signed   By: Vinnie Langton M.D.   On: 12/26/2019 12:43   DG CHEST PORT 1 VIEW  Result Date: 12/26/2019 CLINICAL DATA:  Pneumonia EXAM: PORTABLE CHEST 1 VIEW COMPARISON:  December 25, 2019 FINDINGS: The tracheostomy tube is stable. A right central line is stable. Stable cardiomegaly. Diffuse bilateral interstitial opacities. Hazy opacity in the right base, likely layering effusion. Opacity in the left retrocardiac region obscures the left hemidiaphragm. No pneumothorax. No other acute abnormalities. IMPRESSION: 1. Support apparatus as above. 2. Probable mild pulmonary edema. 3. Probable layering effusion with underlying atelectasis on the right. 4. The opacity in left retrocardiac region may be atelectasis as well. Electronically Signed   By: Dorise Bullion III M.D   On: 12/26/2019 08:37   DG Chest Port 1 View  Result Date: 12/25/2019 CLINICAL DATA:  Status post CPR. Respiratory failure. EXAM: PORTABLE CHEST 1 VIEW COMPARISON:  12/23/2019 FINDINGS: Endotracheal tube is in place with tip 2.7 centimeters above the carina. RIGHT subclavian dual lumen central line tip overlies the superior vena cava. Heart is enlarged and stable in configuration. There are bibasilar opacities which completely obscure the hemidiaphragms have a stable in appearance. Findings are consistent with bilateral pleural effusions and bibasilar opacities. Perihilar opacities are consistent with pulmonary edema. IMPRESSION: 1. Stable appearance of the  chest. 2. Pulmonary edema and bilateral pleural effusions. Electronically Signed   By: Nolon Nations M.D.   On: 12/25/2019 15:09     Medications:     iohexol  Assessment/ Plan:  69 y.o. male with a PMHx of ESRD on HD, CHF, aortic stenosis, hypertension, diabetes mellitus type 2, hyperlipidemia, obstructive sleep apnea, chronic venous stasis with LE edema, and obesity who was admitted to [facility] on 12/06/2019 for ongoing treatment of respiratory failure, ESRD, malnutrition, and generalized debility.    1.  ESRD on HD MWF.    Patient is due for hemodialysis today.  Orders have been prepared.  Ultrafiltration target 2 kg.  2.  Acute respiratory failure.    Patient back on the ventilator after a CODE BLUE event.  Currently tolerating well.  3.  Secondary hyperparathyroidism.    Serum phosphorus 1.8.  Administer sodium phosphorus 20 mmol IV today.  4.  Anemia of chronic kidney disease.  Hemoglobin 9.5 at last check.  Maintain the patient on Retacrit 4000 IV with dialysis treatments.   LOS: 0 Jose Tucker 3/22/20218:12 AM

## 2019-12-27 NOTE — Progress Notes (Addendum)
Pulmonary Critical Care Medicine [place]   PULMONARY CRITICAL CARE SERVICE  PROGRESS NOTE  Date of Service: 12/27/2019  [name]  NLZ:[id number]  DOB: 06-16-1951   DOA: 01/04/2020  Referring Physician: Merton Border, MD  HPI: Jose Tucker is a 69 y.o. male seen for follow up of Acute on Chronic Respiratory Failure.  Patient remains on full support on the ventilator currently on rate of 22 with an FiO2 of 50%.  Satting well no distress.  Medications: Reviewed on Rounds  Physical Exam:  Vitals: Pulse 107 respirations 26 BP 133/79 O2 sat 100% temp 97.4  Ventilator Settings ventilator mode AC VC rate of 22 tidal volume 500 PEEP of 5 and FiO2 of 50%  . General: Comfortable at this time . Eyes: Grossly normal lids, irises & conjunctiva . ENT: grossly tongue is normal . Neck: no obvious mass . Cardiovascular: S1 S2 normal no gallop . Respiratory: No rales or rhonchi noted . Abdomen: soft . Skin: no rash seen on limited exam . Musculoskeletal: not rigid . Psychiatric:unable to assess . Neurologic: no seizure no involuntary movements         Lab Data:   Basic Metabolic Panel: Recent Labs  Lab 12/22/19 0949 12/24/19 0421 12/25/19 1716 12/27/19 0632  NA 132* 133* 139 132*  K 3.9 3.7 3.5 3.7  CL 93* 95* 99 95*  CO2 27 26 23 24   GLUCOSE 129* 162* 94 145*  BUN 48* 42* 37* 58*  CREATININE 4.55* 3.97* 3.80* 4.40*  CALCIUM 9.3 8.5* 8.9 8.8*  MG  --   --  1.9  --   PHOS 4.9* 2.2* 2.1* 1.8*    ABG: Recent Labs  Lab 12/25/19 1716 12/25/19 2015  PHART  --  7.375  PCO2ART  --  43.2  PO2ART  --  147*  HCO3 25.0 24.6  O2SAT 31.7 99.5    Liver Function Tests: Recent Labs  Lab 12/22/19 0949 12/24/19 0421 12/25/19 1716 12/27/19 0632  ALBUMIN 2.5* 2.0* 2.1* 1.8*   No results for input(s): LIPASE, AMYLASE in the last 168 hours. No results for input(s): AMMONIA in the last 168 hours.  CBC: Recent Labs  Lab 12/22/19 0949 12/23/19 1855  12/24/19 0421 12/25/19 1716 12/27/19 0632  WBC 9.5 25.6* 21.8* 27.9* 23.0*  HGB 10.0* 9.2* 8.8* 9.5* 9.8*  HCT 33.3* 30.5* 28.5* 32.7* 31.9*  MCV 99.7 98.7 97.3 103.2* 95.2  PLT 222 223 224 227 177    Cardiac Enzymes: Recent Labs  Lab 12/25/19 1716  CKTOTAL 86  CKMB 13.3*    BNP (last 3 results) No results for input(s): BNP in the last 8760 hours.  ProBNP (last 3 results) No results for input(s): PROBNP in the last 8760 hours.  Radiological Exams: DG CHEST PORT 1 VIEW  Result Date: 12/26/2019 CLINICAL DATA:  69 year old male status post PICC placement. EXAM: PORTABLE CHEST 1 VIEW COMPARISON:  Chest x-ray 12/26/2019. FINDINGS: A tracheostomy tube is in place with tip 4.2 cm above the carina. Right subclavian PermCath with tips terminating in the distal superior vena cava and superior aspect of the right atrium. New left IJ central venous catheter with tip terminating in the mid superior vena cava. Lung volumes are low. Extensive bibasilar opacities which may reflect areas of atelectasis and/or consolidation. Moderate right and small left pleural effusions. Moderate cardiomegaly. The patient is rotated to the left on today's exam, resulting in distortion of the mediastinal contours and reduced diagnostic sensitivity and specificity for mediastinal pathology. Aortic atherosclerosis.  IMPRESSION: 1. Support apparatus, as above. 2. Low lung volumes with persistent bibasilar areas of atelectasis and/or consolidation with moderate right and small left pleural effusions. 3. Moderate cardiomegaly. 4. Aortic atherosclerosis. Electronically Signed   By: Vinnie Langton M.D.   On: 12/26/2019 12:43   DG CHEST PORT 1 VIEW  Result Date: 12/26/2019 CLINICAL DATA:  Pneumonia EXAM: PORTABLE CHEST 1 VIEW COMPARISON:  December 25, 2019 FINDINGS: The tracheostomy tube is stable. A right central line is stable. Stable cardiomegaly. Diffuse bilateral interstitial opacities. Hazy opacity in the right base,  likely layering effusion. Opacity in the left retrocardiac region obscures the left hemidiaphragm. No pneumothorax. No other acute abnormalities. IMPRESSION: 1. Support apparatus as above. 2. Probable mild pulmonary edema. 3. Probable layering effusion with underlying atelectasis on the right. 4. The opacity in left retrocardiac region may be atelectasis as well. Electronically Signed   By: Dorise Bullion III M.D   On: 12/26/2019 08:37   ECHOCARDIOGRAM COMPLETE  Result Date: 12/27/2019    ECHOCARDIOGRAM REPORT   Patient Name:   Jose Tucker Date of Exam: 12/27/2019 Medical Rec #:  [id number]    Height: Accession #:    [id number]   Weight: Date of Birth:  1951/09/14    BSA: Patient Age:    2 years     BP:           121/87 mmHg Patient Gender: M            HR:           57 bpm. Exam Location:  Inpatient Procedure: 2D Echo Indications:     CHF-Acute Systolic 875.64 / P32.95  History:         Patient has no prior history of Echocardiogram examinations.                  Arrythmias:Atrial Fibrillation. Acute on chronic respiratory                  failure with hypoxia                  Bilateral pleural effusion.  Sonographer:     Vikki Ports Turrentine Referring Phys:  [id number] Windle Guard LI Diagnosing Phys: Dixie Dials MD  Sonographer Comments: Echo performed with patient supine and on artificial respirator. Image acquisition challenging due to patient body habitus and Image acquisition challenging due to uncooperative patient. IMPRESSIONS  1. Left ventricular ejection fraction, by estimation, is 45 to 50%. The left ventricle has mildly decreased function. The left ventricle demonstrates regional wall motion abnormalities (see scoring diagram/findings for description). There is mild concentric left ventricular hypertrophy. Left ventricular diastolic parameters are consistent with Grade I diastolic dysfunction (impaired relaxation). There is moderate hypokinesis of the left ventricular, basal-mid anteroseptal wall.  2. Right  ventricular systolic function is normal. The right ventricular size is mildly enlarged. There is moderately elevated pulmonary artery systolic pressure.  3. Left atrial size was severely dilated.  4. Right atrial size was severely dilated.  5. The mitral valve is degenerative. Mild mitral valve regurgitation. Mild mitral stenosis.  6. The aortic valve is tricuspid. Aortic valve regurgitation is trivial. Moderate to severe aortic valve stenosis.  7. There is mild (Grade II) atheroma plaque involving the ascending aorta.  8. The inferior vena cava is dilated in size with <50% respiratory variability, suggesting right atrial pressure of 15 mmHg. FINDINGS  Left Ventricle: Left ventricular ejection fraction, by estimation, is 45 to 50%. The left ventricle has  mildly decreased function. The left ventricle demonstrates regional wall motion abnormalities. Moderate hypokinesis of the left ventricular, basal-mid anteroseptal wall. The left ventricular internal cavity size was normal in size. There is mild concentric left ventricular hypertrophy. Left ventricular diastolic parameters are consistent with Grade I diastolic dysfunction (impaired relaxation).  LV Wall Scoring: The entire anterior wall and entire septum are hypokinetic. The entire lateral wall, entire inferior wall, and apex are normal. Right Ventricle: The right ventricular size is mildly enlarged. No increase in right ventricular wall thickness. Right ventricular systolic function is normal. There is moderately elevated pulmonary artery systolic pressure. Left Atrium: Left atrial size was severely dilated. Right Atrium: Right atrial size was severely dilated. Pericardium: A small pericardial effusion is present. The pericardial effusion is circumferential. Mitral Valve: The mitral valve is degenerative in appearance. There is moderate thickening of the mitral valve leaflet(s). There is moderate calcification of the mitral valve leaflet(s). Moderately decreased  mobility of the mitral valve leaflets. Severe mitral annular calcification. Mild mitral valve regurgitation. Mild mitral valve stenosis. Tricuspid Valve: The tricuspid valve is normal in structure. Tricuspid valve regurgitation is mild. Aortic Valve: The aortic valve is tricuspid. . There is moderate thickening and severe calcifcation of the aortic valve. Aortic valve regurgitation is trivial. Moderate to severe aortic stenosis is present. Moderate aortic valve annular calcification. There is moderate thickening of the aortic valve. There is severe calcifcation of the aortic valve. Aortic valve mean gradient measures 20.2 mmHg. Aortic valve peak gradient measures 33.2 mmHg. Aortic valve area, by VTI measures 0.91 cm. Pulmonic Valve: The pulmonic valve was normal in structure. Pulmonic valve regurgitation is not visualized. No evidence of pulmonic stenosis. Aorta: The aortic root is normal in size and structure. There is mild (Grade II) atheroma plaque involving the ascending aorta. Venous: The inferior vena cava is dilated in size with less than 50% respiratory variability, suggesting right atrial pressure of 15 mmHg. IAS/Shunts: No atrial level shunt detected by color flow Doppler.  LEFT VENTRICLE PLAX 2D LVIDd:         4.50 cm LVIDs:         3.30 cm LV PW:         1.30 cm LV IVS:        1.20 cm LVOT diam:     2.00 cm LV SV:         45 LVOT Area:     3.14 cm  RIGHT VENTRICLE RV S prime:     9.25 cm/s TAPSE (M-mode): 1.2 cm LEFT ATRIUM              RIGHT ATRIUM LA diam:        5.60 cm  RA Area:     27.00 cm LA Vol (A2C):   100.0 ml RA Volume:   90.20 ml LA Vol (A4C):   74.4 ml LA Biplane Vol: 90.8 ml  AORTIC VALVE AV Area (Vmax):    1.13 cm AV Area (Vmean):   1.07 cm AV Area (VTI):     0.91 cm AV Vmax:           288.20 cm/s AV Vmean:          213.800 cm/s AV VTI:            0.493 m AV Peak Grad:      33.2 mmHg AV Mean Grad:      20.2 mmHg LVOT Vmax:         104.00 cm/s LVOT Vmean:  72.600 cm/s LVOT VTI:           0.143 m LVOT/AV VTI ratio: 0.29  AORTA Ao Root diam: 3.60 cm MITRAL VALVE MV Area (PHT): 2.82 cm     SHUNTS MV Decel Time: 269 msec     Systemic VTI:  0.14 m MV E velocity: 159.67 cm/s  Systemic Diam: 2.00 cm Dixie Dials MD Electronically signed by Dixie Dials MD Signature Date/Time: 12/27/2019/3:36:30 PM    Final     Assessment/Plan Active Problems:   Acute on chronic respiratory failure with hypoxia (HCC)   Chronic systolic (congestive) heart failure (HCC)   End stage renal disease on dialysis [facility])   Chronic atrial fibrillation (HCC)   Bilateral pleural effusion   1. Acute on chronic respiratory failure with hypoxiacontinue full support ventilator at this time settings above. Continue aggressive pulmonary toilet and supportive measures. 2. Chronic systolic heart failure right now is compensated we will continue present management 3. End-stage renal disease on dialysis 4. Chronic atrial fibrillation rate controlled 5. Bilateral pleural effusion with pulmonary edema post code.   I have personally seen and evaluated the patient, evaluated laboratory and imaging results, formulated the assessment and plan and placed orders. The Patient requires high complexity decision making with multiple systems involvement.  Rounds were done with the Respiratory Therapy Director and Staff therapists and discussed with nursing staff also.  Allyne Gee, MD [facility] Pulmonary Critical Care Medicine Sleep Medicine

## 2019-12-27 NOTE — Progress Notes (Signed)
  Echocardiogram 2D Echocardiogram has been performed.  Jose Tucker A Jose Tucker 12/27/2019, 2:41 PM

## 2019-12-28 ENCOUNTER — Other Ambulatory Visit (HOSPITAL_COMMUNITY): Payer: Medicare Other

## 2019-12-28 DIAGNOSIS — J9621 Acute and chronic respiratory failure with hypoxia: Secondary | ICD-10-CM | POA: Diagnosis not present

## 2019-12-28 DIAGNOSIS — J90 Pleural effusion, not elsewhere classified: Secondary | ICD-10-CM | POA: Diagnosis not present

## 2019-12-28 DIAGNOSIS — I482 Chronic atrial fibrillation, unspecified: Secondary | ICD-10-CM | POA: Diagnosis not present

## 2019-12-28 DIAGNOSIS — I5022 Chronic systolic (congestive) heart failure: Secondary | ICD-10-CM | POA: Diagnosis not present

## 2019-12-28 LAB — BLOOD GAS, ARTERIAL
Acid-Base Excess: 2.3 mmol/L — ABNORMAL HIGH (ref 0.0–2.0)
Acid-base deficit: 1.1 mmol/L (ref 0.0–2.0)
Bicarbonate: 24.5 mmol/L (ref 20.0–28.0)
Bicarbonate: 26.3 mmol/L (ref 20.0–28.0)
FIO2: 100
FIO2: 85
O2 Saturation: 97.9 %
O2 Saturation: 99.6 %
Patient temperature: 37
Patient temperature: 35.9
pCO2 arterial: 51.8 mmHg — ABNORMAL HIGH (ref 32.0–48.0)
pCO2 arterial: 38.7 mmHg (ref 32.0–48.0)
pH, Arterial: 7.297 — ABNORMAL LOW (ref 7.350–7.450)
pH, Arterial: 7.441 (ref 7.350–7.450)
pO2, Arterial: 109 mmHg — ABNORMAL HIGH (ref 83.0–108.0)
pO2, Arterial: 154 mmHg — ABNORMAL HIGH (ref 83.0–108.0)

## 2019-12-28 LAB — BASIC METABOLIC PANEL WITH GFR
Anion gap: 14 (ref 5–15)
BUN: 50 mg/dL — ABNORMAL HIGH (ref 8–23)
CO2: 25 mmol/L (ref 22–32)
Calcium: 8.5 mg/dL — ABNORMAL LOW (ref 8.9–10.3)
Chloride: 94 mmol/L — ABNORMAL LOW (ref 98–111)
Creatinine, Ser: 3.97 mg/dL — ABNORMAL HIGH (ref 0.61–1.24)
GFR calc Af Amer: 17 mL/min — ABNORMAL LOW
GFR calc non Af Amer: 15 mL/min — ABNORMAL LOW
Glucose, Bld: 181 mg/dL — ABNORMAL HIGH (ref 70–99)
Potassium: 3.7 mmol/L (ref 3.5–5.1)
Sodium: 133 mmol/L — ABNORMAL LOW (ref 135–145)

## 2019-12-28 LAB — CBC
HCT: 31.6 % — ABNORMAL LOW (ref 39.0–52.0)
Hemoglobin: 10 g/dL — ABNORMAL LOW (ref 13.0–17.0)
MCH: 29.9 pg (ref 26.0–34.0)
MCHC: 31.6 g/dL (ref 30.0–36.0)
MCV: 94.3 fL (ref 80.0–100.0)
Platelets: 122 10*3/uL — ABNORMAL LOW (ref 150–400)
RBC: 3.35 MIL/uL — ABNORMAL LOW (ref 4.22–5.81)
RDW: 16.7 % — ABNORMAL HIGH (ref 11.5–15.5)
WBC: 21.4 10*3/uL — ABNORMAL HIGH (ref 4.0–10.5)
nRBC: 0.2 % (ref 0.0–0.2)

## 2019-12-28 LAB — TROPONIN I (HIGH SENSITIVITY): Troponin I (High Sensitivity): 151 ng/L

## 2019-12-28 LAB — CK TOTAL AND CKMB (NOT AT ARMC)
CK, MB: 4.2 ng/mL (ref 0.5–5.0)
Relative Index: INVALID (ref 0.0–2.5)
Total CK: 25 U/L — ABNORMAL LOW (ref 49–397)

## 2019-12-28 LAB — MAGNESIUM: Magnesium: 1.8 mg/dL (ref 1.7–2.4)

## 2019-12-28 NOTE — Progress Notes (Addendum)
Pulmonary Critical Care Medicine [place]   PULMONARY CRITICAL CARE SERVICE  PROGRESS NOTE  Date of Service: 12/28/2019  Jose Tucker  YQI:[id number]  DOB: 11-06-1950   DOA: 12/08/2019  Referring Physician: Merton Border, MD  HPI: Jose Tucker is a 69 y.o. male seen for follow up of Acute on Chronic Respiratory Failure.  Patient was able to complete 4 hours on pressure support before being put on aerosol trach collar and then had an episode where she he coded.  Now back on full support assist control with better control rate of 25 and FiO2 of 85% currently.  Medications: Reviewed on Rounds  Physical Exam:  Vitals: Pulse 110 respiration 17 BP 134/69 O2 sat 97% temp 96.4  Ventilator Settings ventilator mode AC VC rate of 25 tidal volume 500 PEEP of 5 and FiO2 of 85%  . General: Comfortable at this time . Eyes: Grossly normal lids, irises & conjunctiva . ENT: grossly tongue is normal . Neck: no obvious mass . Cardiovascular: S1 S2 normal no gallop . Respiratory: Coarse breath sounds . Abdomen: soft . Skin: no rash seen on limited exam . Musculoskeletal: not rigid . Psychiatric:unable to assess . Neurologic: no seizure no involuntary movements         Lab Data:   Basic Metabolic Panel: Recent Labs  Lab 12/22/19 0949 12/24/19 0421 12/25/19 1716 12/27/19 0632 12/28/19 1539  NA 132* 133* 139 132* 133*  K 3.9 3.7 3.5 3.7 3.7  CL 93* 95* 99 95* 94*  CO2 27 26 23 24 25   GLUCOSE 129* 162* 94 145* 181*  BUN 48* 42* 37* 58* 50*  CREATININE 4.55* 3.97* 3.80* 4.40* 3.97*  CALCIUM 9.3 8.5* 8.9 8.8* 8.5*  MG  --   --  1.9  --  1.8  PHOS 4.9* 2.2* 2.1* 1.8*  --     ABG: Recent Labs  Lab 12/25/19 1716 12/25/19 2015 12/28/19 1445  PHART  --  7.375 7.297*  PCO2ART  --  43.2 51.8*  PO2ART  --  147* 109*  HCO3 25.0 24.6 24.5  O2SAT 31.7 99.5 97.9    Liver Function Tests: Recent Labs  Lab 12/22/19 0949 12/24/19 0421 12/25/19 1716 12/27/19 0632   ALBUMIN 2.5* 2.0* 2.1* 1.8*   No results for input(s): LIPASE, AMYLASE in the last 168 hours. No results for input(s): AMMONIA in the last 168 hours.  CBC: Recent Labs  Lab 12/23/19 1855 12/24/19 0421 12/25/19 1716 12/27/19 0632 12/28/19 1430  WBC 25.6* 21.8* 27.9* 23.0* 21.4*  HGB 9.2* 8.8* 9.5* 9.8* 10.0*  HCT 30.5* 28.5* 32.7* 31.9* 31.6*  MCV 98.7 97.3 103.2* 95.2 94.3  PLT 223 224 227 177 122*    Cardiac Enzymes: Recent Labs  Lab 12/25/19 1716 12/28/19 1539  CKTOTAL 86 25*  CKMB 13.3* 4.2    BNP (last 3 results) No results for input(s): BNP in the last 8760 hours.  ProBNP (last 3 results) No results for input(s): PROBNP in the last 8760 hours.  Radiological Exams: DG Chest Port 1 View  Result Date: 12/28/2019 CLINICAL DATA:  Respiratory distress. Acute on chronic respiratory failure with hypoxia. EXAM: PORTABLE CHEST 1 VIEW COMPARISON:  12/26/2019 FINDINGS: Tracheostomy tube appears in good position, unchanged. Double lumen central venous catheter tips at the cavoatrial junction in good position, unchanged. Left jugular vein catheter tip is in the superior vena cava at the level of the azygos vein. Persistent enlargement of the cardiac silhouette. Persistent bilateral pleural effusions. Pulmonary vascularity is now  normal. IMPRESSION: Pulmonary vascularity is now normal. Persistent bilateral effusions. Electronically Signed   By: Lorriane Shire M.D.   On: 12/28/2019 15:13   ECHOCARDIOGRAM COMPLETE  Result Date: 12/27/2019    ECHOCARDIOGRAM REPORT   Patient Name:   Jose Tucker Date of Exam: 12/27/2019 Medical Rec #:  [id number]    Height: Accession #:    [id number]   Weight: Date of Birth:  10/28/1950    BSA: Patient Age:    87 years     BP:           121/87 mmHg Patient Gender: M            HR:           57 bpm. Exam Location:  Inpatient Procedure: 2D Echo Indications:     CHF-Acute Systolic 188.41 / Y60.63  History:         Patient has no prior history of  Echocardiogram examinations.                  Arrythmias:Atrial Fibrillation. Acute on chronic respiratory                  failure with hypoxia                  Bilateral pleural effusion.  Sonographer:     Vikki Ports Turrentine Referring Phys:  [id number] Windle Guard LI Diagnosing Phys: Dixie Dials MD  Sonographer Comments: Echo performed with patient supine and on artificial respirator. Image acquisition challenging due to patient body habitus and Image acquisition challenging due to uncooperative patient. IMPRESSIONS  1. Left ventricular ejection fraction, by estimation, is 45 to 50%. The left ventricle has mildly decreased function. The left ventricle demonstrates regional wall motion abnormalities (see scoring diagram/findings for description). There is mild concentric left ventricular hypertrophy. Left ventricular diastolic parameters are consistent with Grade I diastolic dysfunction (impaired relaxation). There is moderate hypokinesis of the left ventricular, basal-mid anteroseptal wall.  2. Right ventricular systolic function is normal. The right ventricular size is mildly enlarged. There is moderately elevated pulmonary artery systolic pressure.  3. Left atrial size was severely dilated.  4. Right atrial size was severely dilated.  5. The mitral valve is degenerative. Mild mitral valve regurgitation. Mild mitral stenosis.  6. The aortic valve is tricuspid. Aortic valve regurgitation is trivial. Moderate to severe aortic valve stenosis.  7. There is mild (Grade II) atheroma plaque involving the ascending aorta.  8. The inferior vena cava is dilated in size with <50% respiratory variability, suggesting right atrial pressure of 15 mmHg. FINDINGS  Left Ventricle: Left ventricular ejection fraction, by estimation, is 45 to 50%. The left ventricle has mildly decreased function. The left ventricle demonstrates regional wall motion abnormalities. Moderate hypokinesis of the left ventricular, basal-mid anteroseptal wall. The  left ventricular internal cavity size was normal in size. There is mild concentric left ventricular hypertrophy. Left ventricular diastolic parameters are consistent with Grade I diastolic dysfunction (impaired relaxation).  LV Wall Scoring: The entire anterior wall and entire septum are hypokinetic. The entire lateral wall, entire inferior wall, and apex are normal. Right Ventricle: The right ventricular size is mildly enlarged. No increase in right ventricular wall thickness. Right ventricular systolic function is normal. There is moderately elevated pulmonary artery systolic pressure. Left Atrium: Left atrial size was severely dilated. Right Atrium: Right atrial size was severely dilated. Pericardium: A small pericardial effusion is present. The pericardial effusion is circumferential. Mitral Valve: The mitral valve is  degenerative in appearance. There is moderate thickening of the mitral valve leaflet(s). There is moderate calcification of the mitral valve leaflet(s). Moderately decreased mobility of the mitral valve leaflets. Severe mitral annular calcification. Mild mitral valve regurgitation. Mild mitral valve stenosis. Tricuspid Valve: The tricuspid valve is normal in structure. Tricuspid valve regurgitation is mild. Aortic Valve: The aortic valve is tricuspid. . There is moderate thickening and severe calcifcation of the aortic valve. Aortic valve regurgitation is trivial. Moderate to severe aortic stenosis is present. Moderate aortic valve annular calcification. There is moderate thickening of the aortic valve. There is severe calcifcation of the aortic valve. Aortic valve mean gradient measures 20.2 mmHg. Aortic valve peak gradient measures 33.2 mmHg. Aortic valve area, by VTI measures 0.91 cm. Pulmonic Valve: The pulmonic valve was normal in structure. Pulmonic valve regurgitation is not visualized. No evidence of pulmonic stenosis. Aorta: The aortic root is normal in size and structure. There is mild  (Grade II) atheroma plaque involving the ascending aorta. Venous: The inferior vena cava is dilated in size with less than 50% respiratory variability, suggesting right atrial pressure of 15 mmHg. IAS/Shunts: No atrial level shunt detected by color flow Doppler.  LEFT VENTRICLE PLAX 2D LVIDd:         4.50 cm LVIDs:         3.30 cm LV PW:         1.30 cm LV IVS:        1.20 cm LVOT diam:     2.00 cm LV SV:         45 LVOT Area:     3.14 cm  RIGHT VENTRICLE RV S prime:     9.25 cm/s TAPSE (M-mode): 1.2 cm LEFT ATRIUM              RIGHT ATRIUM LA diam:        5.60 cm  RA Area:     27.00 cm LA Vol (A2C):   100.0 ml RA Volume:   90.20 ml LA Vol (A4C):   74.4 ml LA Biplane Vol: 90.8 ml  AORTIC VALVE AV Area (Vmax):    1.13 cm AV Area (Vmean):   1.07 cm AV Area (VTI):     0.91 cm AV Vmax:           288.20 cm/s AV Vmean:          213.800 cm/s AV VTI:            0.493 m AV Peak Grad:      33.2 mmHg AV Mean Grad:      20.2 mmHg LVOT Vmax:         104.00 cm/s LVOT Vmean:        72.600 cm/s LVOT VTI:          0.143 m LVOT/AV VTI ratio: 0.29  AORTA Ao Root diam: 3.60 cm MITRAL VALVE MV Area (PHT): 2.82 cm     SHUNTS MV Decel Time: 269 msec     Systemic VTI:  0.14 m MV E velocity: 159.67 cm/s  Systemic Diam: 2.00 cm Dixie Dials MD Electronically signed by Dixie Dials MD Signature Date/Time: 12/27/2019/3:36:30 PM    Final     Assessment/Plan Active Problems:   Acute on chronic respiratory failure with hypoxia (HCC)   Chronic systolic (congestive) heart failure (HCC)   End stage renal disease on dialysis [facility])   Chronic atrial fibrillation (HCC)   Bilateral pleural effusion   1. Acute on chronic respiratory failure  with hypoxiapatient had an episode where he coded now on full support on the ventilator will continue full support at this time and then attempt weaning as patient can tolerate. 2. Chronic systolic heart failure right now is compensated we will continue present management 3. End-stage renal disease  on dialysis 4. Chronic atrial fibrillation rate controlled 5. Bilateral pleural effusionwith pulmonary edema post code.   I have personally seen and evaluated the patient, evaluated laboratory and imaging results, formulated the assessment and plan and placed orders. The Patient requires high complexity decision making with multiple systems involvement.  Rounds were done with the Respiratory Therapy Director and Staff therapists and discussed with nursing staff also.  Allyne Gee, MD [facility] Pulmonary Critical Care Medicine Sleep Medicine
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
## 2019-12-29 ENCOUNTER — Other Ambulatory Visit (HOSPITAL_COMMUNITY): Payer: Medicare Other

## 2019-12-29 DIAGNOSIS — I5022 Chronic systolic (congestive) heart failure: Secondary | ICD-10-CM | POA: Diagnosis not present

## 2019-12-29 DIAGNOSIS — I482 Chronic atrial fibrillation, unspecified: Secondary | ICD-10-CM | POA: Diagnosis not present

## 2019-12-29 DIAGNOSIS — J9621 Acute and chronic respiratory failure with hypoxia: Secondary | ICD-10-CM | POA: Diagnosis not present

## 2019-12-29 DIAGNOSIS — J90 Pleural effusion, not elsewhere classified: Secondary | ICD-10-CM | POA: Diagnosis not present

## 2019-12-29 LAB — RENAL FUNCTION PANEL
Albumin: 1.6 g/dL — ABNORMAL LOW (ref 3.5–5.0)
Anion gap: 12 (ref 5–15)
BUN: 57 mg/dL — ABNORMAL HIGH (ref 8–23)
CO2: 25 mmol/L (ref 22–32)
Calcium: 8.5 mg/dL — ABNORMAL LOW (ref 8.9–10.3)
Chloride: 94 mmol/L — ABNORMAL LOW (ref 98–111)
Creatinine, Ser: 3.97 mg/dL — ABNORMAL HIGH (ref 0.61–1.24)
GFR calc Af Amer: 17 mL/min — ABNORMAL LOW (ref >60)
GFR calc non Af Amer: 15 mL/min — ABNORMAL LOW (ref >60)
Glucose, Bld: 161 mg/dL — ABNORMAL HIGH (ref 70–99)
Phosphorus: 1.7 mg/dL — ABNORMAL LOW (ref 2.5–4.6)
Potassium: 3.6 mmol/L (ref 3.5–5.1)
Sodium: 131 mmol/L — ABNORMAL LOW (ref 135–145)

## 2019-12-29 LAB — CBC
HCT: 31.9 % — ABNORMAL LOW (ref 39.0–52.0)
Hemoglobin: 10 g/dL — ABNORMAL LOW (ref 13.0–17.0)
MCH: 29 pg (ref 26.0–34.0)
MCHC: 31.3 g/dL (ref 30.0–36.0)
MCV: 92.5 fL (ref 80.0–100.0)
Platelets: 124 10*3/uL — ABNORMAL LOW (ref 150–400)
RBC: 3.45 MIL/uL — ABNORMAL LOW (ref 4.22–5.81)
RDW: 16.5 % — ABNORMAL HIGH (ref 11.5–15.5)
WBC: 17.7 10*3/uL — ABNORMAL HIGH (ref 4.0–10.5)
nRBC: 0 % (ref 0.0–0.2)

## 2019-12-29 NOTE — Progress Notes (Addendum)
Pulmonary Critical Care Medicine [place]   PULMONARY CRITICAL CARE SERVICE  PROGRESS NOTE  Date of Service: 12/29/2019  Callahan Wild  JXB:[id number]  DOB: 10-18-1950   DOA: 12/31/2019  Referring Physician: Merton Border, MD  HPI: Jose Tucker is a 69 y.o. male seen for follow up of Acute on Chronic Respiratory Failure.  Patient continues on full support ventilator at this time currently on FiO2 of 65% satting well no fever or distress  Medications: Reviewed on Rounds  Physical Exam:  Vitals: Pulse 116 respirations 26 BP 141/70 O2 sat 100% temp 96 point  Ventilator Settings ventilator mode AC VC rate 25 tidal volume 500 PEEP of 5 and FiO2 of 60%  . General: Comfortable at this time . Eyes: Grossly normal lids, irises & conjunctiva . ENT: grossly tongue is normal . Neck: no obvious mass . Cardiovascular: S1 S2 normal no gallop . Respiratory: No rales . Abdomen: soft . Skin: no rash seen on limited exam . Musculoskeletal: not rigid . Psychiatric:unable to assess . Neurologic: no seizure no involuntary movements         Lab Data:   Basic Metabolic Panel: Recent Labs  Lab 12/24/19 0421 12/25/19 1716 12/27/19 0632 12/28/19 1539 12/29/19 0612  NA 133* 139 132* 133* 131*  K 3.7 3.5 3.7 3.7 3.6  CL 95* 99 95* 94* 94*  CO2 26 23 24 25 25   GLUCOSE 162* 94 145* 181* 161*  BUN 42* 37* 58* 50* 57*  CREATININE 3.97* 3.80* 4.40* 3.97* 3.97*  CALCIUM 8.5* 8.9 8.8* 8.5* 8.5*  MG  --  1.9  --  1.8  --   PHOS 2.2* 2.1* 1.8*  --  1.7*    ABG: Recent Labs  Lab 12/25/19 1716 12/25/19 2015 12/28/19 1445 12/28/19 2000  PHART  --  7.375 7.297* 7.441  PCO2ART  --  43.2 51.8* 38.7  PO2ART  --  147* 109* 154*  HCO3 25.0 24.6 24.5 26.3  O2SAT 31.7 99.5 97.9 99.6    Liver Function Tests: Recent Labs  Lab 12/24/19 0421 12/25/19 1716 12/27/19 0632 12/29/19 0612  ALBUMIN 2.0* 2.1* 1.8* 1.6*   No results for input(s): LIPASE, AMYLASE in the last 168  hours. No results for input(s): AMMONIA in the last 168 hours.  CBC: Recent Labs  Lab 12/24/19 0421 12/25/19 1716 12/27/19 0632 12/28/19 1430 12/29/19 0612  WBC 21.8* 27.9* 23.0* 21.4* 17.7*  HGB 8.8* 9.5* 9.8* 10.0* 10.0*  HCT 28.5* 32.7* 31.9* 31.6* 31.9*  MCV 97.3 103.2* 95.2 94.3 92.5  PLT 224 227 177 122* 124*    Cardiac Enzymes: Recent Labs  Lab 12/25/19 1716 12/28/19 1539  CKTOTAL 86 25*  CKMB 13.3* 4.2    BNP (last 3 results) No results for input(s): BNP in the last 8760 hours.  ProBNP (last 3 results) No results for input(s): PROBNP in the last 8760 hours.  Radiological Exams: CT HEAD WO CONTRAST  Result Date: 12/29/2019 CLINICAL DATA:  Unresponsive, encephalopathy EXAM: CT HEAD WITHOUT CONTRAST TECHNIQUE: Contiguous axial images were obtained from the base of the skull through the vertex without intravenous contrast. COMPARISON:  None. FINDINGS: Brain: Evaluation is slightly limited due to patient motion. Hypodensity within the inferior aspect right cerebellar hemisphere consistent with age-indeterminate ischemic change. No evidence of acute hemorrhage. Lateral ventricles and remaining midline structures are unremarkable. No acute extra-axial fluid collections. No mass effect. Vascular: No hyperdense vessel or unexpected calcification. Skull: Normal. Negative for fracture or focal lesion. Sinuses/Orbits: Near complete opacification  left sphenoid sinus. Remaining sinuses are clear. Other: None IMPRESSION: 1. Hypodensity right cerebellar hemisphere compatible with age indeterminate infarct. 2. No evidence of hemorrhage. 3. Limited study due to patient motion. Electronically Signed   By: Randa Ngo M.D.   On: 12/29/2019 15:09   DG Chest Port 1 View  Result Date: 12/28/2019 CLINICAL DATA:  Respiratory distress. Acute on chronic respiratory failure with hypoxia. EXAM: PORTABLE CHEST 1 VIEW COMPARISON:  12/26/2019 FINDINGS: Tracheostomy tube appears in good position,  unchanged. Double lumen central venous catheter tips at the cavoatrial junction in good position, unchanged. Left jugular vein catheter tip is in the superior vena cava at the level of the azygos vein. Persistent enlargement of the cardiac silhouette. Persistent bilateral pleural effusions. Pulmonary vascularity is now normal. IMPRESSION: Pulmonary vascularity is now normal. Persistent bilateral effusions. Electronically Signed   By: Lorriane Shire M.D.   On: 12/28/2019 15:13    Assessment/Plan Active Problems:   Acute on chronic respiratory failure with hypoxia (HCC)   Chronic systolic (congestive) heart failure (HCC)   End stage renal disease on dialysis [facility])   Chronic atrial fibrillation (HCC)   Bilateral pleural effusion   1. Acute on chronic respiratory failure with hypoxiacontinue full support ventilator at this time settings above. Continue aggressive pulmonary toilet and supportive measures. 2. Chronic systolic heart failure right now is compensated we will continue present management 3. End-stage renal disease on dialysis 4. Chronic atrial fibrillation rate controlled 5. Bilateral pleural effusionwith pulmonary edema post code.   I have personally seen and evaluated the patient, evaluated laboratory and imaging results, formulated the assessment and plan and placed orders. The Patient requires high complexity decision making with multiple systems involvement.  Rounds were done with the Respiratory Therapy Director and Staff therapists and discussed with nursing staff also.  Allyne Gee, MD [facility] Pulmonary Critical Care Medicine Sleep Medicine

## 2019-12-29 NOTE — Progress Notes (Signed)
[facility]  ROUNDING NOTE   Subjective:  Patient remains critically ill at the moment. Was started on dialysis but developed a syncopal event during dialysis treatment. Therefore dialysis terminated. Currently awake and alert.  Objective:  Vital signs in last 24 hours:  Temperature 96.8 pulse 116 respirations 26 blood pressure 114/70  Physical Exam: General: Chronically ill-appearing  Head: Normocephalic, atraumatic. Moist oral mucosal membranes  Eyes: Anicteric  Neck: Tracheostomy in place  Lungs:  Scattered rhonchi bilateral, vent assisted  Heart: S1S2 irregular  Abdomen:  Soft, nontender, bowel sounds present  Extremities: Trace peripheral edema.  Neurologic: Awake, alert, following commands  Skin: No lesions  Access: Right IJ PermCath    Basic Metabolic Panel: Recent Labs  Lab 12/22/19 0949 12/22/19 0949 12/24/19 0421 12/24/19 0421 12/25/19 1716 12/25/19 1716 12/27/19 0632 12/28/19 1539 12/29/19 0612  NA 132*   < > 133*  --  139  --  132* 133* 131*  K 3.9   < > 3.7  --  3.5  --  3.7 3.7 3.6  CL 93*   < > 95*  --  99  --  95* 94* 94*  CO2 27   < > 26  --  23  --  24 25 25   GLUCOSE 129*   < > 162*  --  94  --  145* 181* 161*  BUN 48*   < > 42*  --  37*  --  58* 50* 57*  CREATININE 4.55*   < > 3.97*  --  3.80*  --  4.40* 3.97* 3.97*  CALCIUM 9.3   < > 8.5*   < > 8.9   < > 8.8* 8.5* 8.5*  MG  --   --   --   --  1.9  --   --  1.8  --   PHOS 4.9*  --  2.2*  --  2.1*  --  1.8*  --  1.7*   < > = values in this interval not displayed.    Liver Function Tests: Recent Labs  Lab 12/22/19 0949 12/24/19 0421 12/25/19 1716 12/27/19 0632 12/29/19 0612  ALBUMIN 2.5* 2.0* 2.1* 1.8* 1.6*   No results for input(s): LIPASE, AMYLASE in the last 168 hours. No results for input(s): AMMONIA in the last 168 hours.  CBC: Recent Labs  Lab 12/24/19 0421 12/25/19 1716 12/27/19 0632 12/28/19 1430 12/29/19 0612  WBC 21.8* 27.9* 23.0* 21.4* 17.7*  HGB 8.8*  9.5* 9.8* 10.0* 10.0*  HCT 28.5* 32.7* 31.9* 31.6* 31.9*  MCV 97.3 103.2* 95.2 94.3 92.5  PLT 224 227 177 122* 124*    Cardiac Enzymes: Recent Labs  Lab 12/25/19 1716 12/28/19 1539  CKTOTAL 86 25*  CKMB 13.3* 4.2    BNP: Invalid input(s): POCBNP  CBG: No results for input(s): GLUCAP in the last 168 hours.  Microbiology: Results for orders placed or performed during the hospital encounter of 12/18/2019  C difficile quick scan w PCR reflex     Status: None   Collection Time: 12/18/19  4:49 AM   Specimen: STOOL  Result Value Ref Range Status   C Diff antigen NEGATIVE NEGATIVE Final   C Diff toxin NEGATIVE NEGATIVE Final   C Diff interpretation No C. difficile detected.  Corrected    Comment: Performed at [facility], [place] [address] [place], [place] [address] CORRECTED ON 03/13 AT 1510: PREVIOUSLY REPORTED AS VALID   Culture, respiratory (non-expectorated)     Status: None  Collection Time: 12/24/19  4:00 PM   Specimen: Tracheal Aspirate; Respiratory  Result Value Ref Range Status   Specimen Description TRACHEAL ASPIRATE  Final   Special Requests NONE  Final   Gram Stain   Final    ABUNDANT WBC PRESENT, PREDOMINANTLY PMN ABUNDANT GRAM POSITIVE RODS FEW GRAM NEGATIVE RODS Performed at [facility], [place] [address] [place], [place] [address]    Culture MODERATE PROTEUS MIRABILIS  Final   Report Status 12/26/2019 FINAL  Final   Organism ID, Bacteria PROTEUS MIRABILIS  Final      Susceptibility   Proteus mirabilis - MIC*    AMPICILLIN <=2 SENSITIVE Sensitive     CEFAZOLIN 8 SENSITIVE Sensitive     CEFEPIME <=0.12 SENSITIVE Sensitive     CEFTAZIDIME <=1 SENSITIVE Sensitive     CEFTRIAXONE <=0.25 SENSITIVE Sensitive     CIPROFLOXACIN <=0.25 SENSITIVE Sensitive     GENTAMICIN <=1 SENSITIVE Sensitive     IMIPENEM 4 SENSITIVE Sensitive     TRIMETH/SULFA <=20 SENSITIVE Sensitive     AMPICILLIN/SULBACTAM <=2 SENSITIVE Sensitive     PIP/TAZO <=4 SENSITIVE  Sensitive     * MODERATE PROTEUS MIRABILIS  Culture, blood (routine x 2)     Status: None (Preliminary result)   Collection Time: 12/25/19  5:16 PM   Specimen: BLOOD  Result Value Ref Range Status   Specimen Description BLOOD RIGHT ANTECUBITAL  Final   Special Requests   Final    BOTTLES DRAWN AEROBIC AND ANAEROBIC Blood Culture adequate volume   Culture   Final    NO GROWTH 4 DAYS Performed at [facility], [address] [place], [place] [address]    Report Status PENDING  Incomplete  Culture, blood (routine x 2)     Status: None (Preliminary result)   Collection Time: 12/25/19  5:18 PM   Specimen: BLOOD  Result Value Ref Range Status   Specimen Description BLOOD RIGHT ANTECUBITAL  Final   Special Requests   Final    BOTTLES DRAWN AEROBIC AND ANAEROBIC Blood Culture adequate volume   Culture   Final    NO GROWTH 4 DAYS Performed at [facility], [place] [address], [place], [place] [address]    Report Status PENDING  Incomplete  C difficile quick scan w PCR reflex     Status: Abnormal   Collection Time: 12/26/19 12:11 PM   Specimen: STOOL  Result Value Ref Range Status   C Diff antigen POSITIVE (A) NEGATIVE Final   C Diff toxin POSITIVE (A) NEGATIVE Final    Comment: CRITICAL RESULT CALLED TO, READ BACK BY AND VERIFIED WITH:  RN CHRISSY R. @1817  12/26/2019 AKT    C Diff interpretation Toxin producing C. difficile detected.  Final    Coagulation Studies: No results for input(s): LABPROT, INR in the last 72 hours.  Urinalysis: No results for input(s): COLORURINE, LABSPEC, PHURINE, GLUCOSEU, HGBUR, BILIRUBINUR, KETONESUR, PROTEINUR, UROBILINOGEN, NITRITE, LEUKOCYTESUR in the last 72 hours.  Invalid input(s): APPERANCEUR    Imaging: DG Chest Port 1 View  Result Date: 12/28/2019 CLINICAL DATA:  Respiratory distress. Acute on chronic respiratory failure with hypoxia. EXAM: PORTABLE CHEST 1 VIEW COMPARISON:  12/26/2019 FINDINGS: Tracheostomy tube appears in good  position, unchanged. Double lumen central venous catheter tips at the cavoatrial junction in good position, unchanged. Left jugular vein catheter tip is in the superior vena cava at the level of the azygos vein. Persistent enlargement of the cardiac silhouette. Persistent bilateral pleural effusions.  Pulmonary vascularity is now normal. IMPRESSION: Pulmonary vascularity is now normal. Persistent bilateral effusions. Electronically Signed   By: Lorriane Shire M.D.   On: 12/28/2019 15:13   ECHOCARDIOGRAM COMPLETE  Result Date: 12/27/2019    ECHOCARDIOGRAM REPORT   Patient Name:   JAI STEIL Date of Exam: 12/27/2019 Medical Rec #:  [id number]    Height: Accession #:    [id number]   Weight: Date of Birth:  Feb 21, 1951    BSA: Patient Age:    32 years     BP:           121/87 mmHg Patient Gender: M            HR:           57 bpm. Exam Location:  Inpatient Procedure: 2D Echo Indications:     CHF-Acute Systolic 259.56 / L87.56  History:         Patient has no prior history of Echocardiogram examinations.                  Arrythmias:Atrial Fibrillation. Acute on chronic respiratory                  failure with hypoxia                  Bilateral pleural effusion.  Sonographer:     Vikki Ports Turrentine Referring Phys:  [id number] LI Diagnosing Phys: Dixie Dials MD  Sonographer Comments: Echo performed with patient supine and on artificial respirator. Image acquisition challenging due to patient body habitus and Image acquisition challenging due to uncooperative patient. IMPRESSIONS  1. Left ventricular ejection fraction, by estimation, is 45 to 50%. The left ventricle has mildly decreased function. The left ventricle demonstrates regional wall motion abnormalities (see scoring diagram/findings for description). There is mild concentric left ventricular hypertrophy. Left ventricular diastolic parameters are consistent with Grade I diastolic dysfunction (impaired relaxation). There is moderate hypokinesis of the left  ventricular, basal-mid anteroseptal wall.  2. Right ventricular systolic function is normal. The right ventricular size is mildly enlarged. There is moderately elevated pulmonary artery systolic pressure.  3. Left atrial size was severely dilated.  4. Right atrial size was severely dilated.  5. The mitral valve is degenerative. Mild mitral valve regurgitation. Mild mitral stenosis.  6. The aortic valve is tricuspid. Aortic valve regurgitation is trivial. Moderate to severe aortic valve stenosis.  7. There is mild (Grade II) atheroma plaque involving the ascending aorta.  8. The inferior vena cava is dilated in size with <50% respiratory variability, suggesting right atrial pressure of 15 mmHg. FINDINGS  Left Ventricle: Left ventricular ejection fraction, by estimation, is 45 to 50%. The left ventricle has mildly decreased function. The left ventricle demonstrates regional wall motion abnormalities. Moderate hypokinesis of the left ventricular, basal-mid anteroseptal wall. The left ventricular internal cavity size was normal in size. There is mild concentric left ventricular hypertrophy. Left ventricular diastolic parameters are consistent with Grade I diastolic dysfunction (impaired relaxation).  LV Wall Scoring: The entire anterior wall and entire septum are hypokinetic. The entire lateral wall, entire inferior wall, and apex are normal. Right Ventricle: The right ventricular size is mildly enlarged. No increase in right ventricular wall thickness. Right ventricular systolic function is normal. There is moderately elevated pulmonary artery systolic pressure. Left Atrium: Left atrial size was severely dilated. Right Atrium: Right atrial size was severely dilated. Pericardium: A small pericardial effusion is present. The pericardial effusion is circumferential. Mitral Valve: The  mitral valve is degenerative in appearance. There is moderate thickening of the mitral valve leaflet(s). There is moderate calcification of  the mitral valve leaflet(s). Moderately decreased mobility of the mitral valve leaflets. Severe mitral annular calcification. Mild mitral valve regurgitation. Mild mitral valve stenosis. Tricuspid Valve: The tricuspid valve is normal in structure. Tricuspid valve regurgitation is mild. Aortic Valve: The aortic valve is tricuspid. . There is moderate thickening and severe calcifcation of the aortic valve. Aortic valve regurgitation is trivial. Moderate to severe aortic stenosis is present. Moderate aortic valve annular calcification. There is moderate thickening of the aortic valve. There is severe calcifcation of the aortic valve. Aortic valve mean gradient measures 20.2 mmHg. Aortic valve peak gradient measures 33.2 mmHg. Aortic valve area, by VTI measures 0.91 cm. Pulmonic Valve: The pulmonic valve was normal in structure. Pulmonic valve regurgitation is not visualized. No evidence of pulmonic stenosis. Aorta: The aortic root is normal in size and structure. There is mild (Grade II) atheroma plaque involving the ascending aorta. Venous: The inferior vena cava is dilated in size with less than 50% respiratory variability, suggesting right atrial pressure of 15 mmHg. IAS/Shunts: No atrial level shunt detected by color flow Doppler.  LEFT VENTRICLE PLAX 2D LVIDd:         4.50 cm LVIDs:         3.30 cm LV PW:         1.30 cm LV IVS:        1.20 cm LVOT diam:     2.00 cm LV SV:         45 LVOT Area:     3.14 cm  RIGHT VENTRICLE RV S prime:     9.25 cm/s TAPSE (M-mode): 1.2 cm LEFT ATRIUM              RIGHT ATRIUM LA diam:        5.60 cm  RA Area:     27.00 cm LA Vol (A2C):   100.0 ml RA Volume:   90.20 ml LA Vol (A4C):   74.4 ml LA Biplane Vol: 90.8 ml  AORTIC VALVE AV Area (Vmax):    1.13 cm AV Area (Vmean):   1.07 cm AV Area (VTI):     0.91 cm AV Vmax:           288.20 cm/s AV Vmean:          213.800 cm/s AV VTI:            0.493 m AV Peak Grad:      33.2 mmHg AV Mean Grad:      20.2 mmHg LVOT Vmax:          104.00 cm/s LVOT Vmean:        72.600 cm/s LVOT VTI:          0.143 m LVOT/AV VTI ratio: 0.29  AORTA Ao Root diam: 3.60 cm MITRAL VALVE MV Area (PHT): 2.82 cm     SHUNTS MV Decel Time: 269 msec     Systemic VTI:  0.14 m MV E velocity: 159.67 cm/s  Systemic Diam: 2.00 cm Dixie Dials MD Electronically signed by Dixie Dials MD Signature Date/Time: 12/27/2019/3:36:30 PM    Final      Medications:     iohexol  Assessment/ Plan:  69 y.o. male with a PMHx of ESRD on HD, CHF, aortic stenosis, hypertension, diabetes mellitus type 2, hyperlipidemia, obstructive sleep apnea, chronic venous stasis with LE edema, and obesity who was admitted to  [facility] on 01/01/2020 for ongoing treatment of respiratory failure, ESRD, malnutrition, and generalized debility.    1.  ESRD on HD MWF.    Dialysis was attempted this a.m. but terminated early as the patient became unresponsive during treatment.  Hold off on further treatment today.  Reattempt treatment on Wednesday however recommend having goals of treatment discussion with the family.  This was discussed with hospitalist.  2.  Acute respiratory failure.    Patient still on the ventilator.  Continue current mechanical ventilation but as above would have goals of care discussion with family.  3.  Secondary hyperparathyroidism.    Serum phosphorus again low at 1.7.  Administer sodium phosphorus 30 mmol IV today.  4.  Anemia of chronic kidney disease.  Hemoglobin up to 10.  Continue Retacrit.   LOS: 0 Jamieson Hetland 3/24/20218:50 AM

## 2019-12-29 NOTE — Consult Note (Signed)
Ref: Patient, No Pcp Per   Subjective:  Had neurologic event this AM without drop in heart rate or blood pressure. Able to communicate some now. IV 16 mcg/min of Levophed continues. Feeding is down to 20 cc/hr. Possible aspiration. Oxygen saturation is 100 % on FiO2 of 80 %. Improving WBC count to 17.7K from 21.4K  Objective:  Vital Signs in the last 24 hours:  T: 97.5, P: 128, R: 26, BP : 105/60  Physical Exam: BP Readings from Last 1 Encounters:  No data found for BP     Wt Readings from Last 1 Encounters:  No data found for Wt    Weight change:  There is no height or weight on file to calculate BMI. HEENT: [place]/AT, Eyes-Hazel, Conjunctiva-Pale pink, Sclera-Non-icteric Neck: No JVD, No bruit, Trachea midline. Lungs:  Rhonchi, Bilateral. Cardiac:  Irregular rhythm, normal S1 and S2, no S3. II/VI systolic murmur. Abdomen:  Soft, non-tender. BS present. Extremities:  Trace edema present. No cyanosis. No clubbing. 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 CNS: AxOx1, Cranial nerves grossly intact, moves all 4 extremities.  Skin: Warm and dry.   Intake/Output from previous day: No intake/output data recorded.    Lab Results: BMET    Component Value Date/Time   NA 131 (L) 12/29/2019 0612   NA 133 (L) 12/28/2019 1539   NA 132 (L) 12/27/2019 0632   K 3.6 12/29/2019 0612   K 3.7 12/28/2019 1539   K 3.7 12/27/2019 0632   CL 94 (L) 12/29/2019 0612   CL 94 (L) 12/28/2019 1539   CL 95 (L) 12/27/2019 0632   CO2 25 12/29/2019 0612   CO2 25 12/28/2019 1539   CO2 24 12/27/2019 0632   GLUCOSE 161 (H) 12/29/2019 0612   GLUCOSE 181 (H) 12/28/2019 1539   GLUCOSE 145 (H) 12/27/2019 0632   BUN 57 (H) 12/29/2019 0612   BUN 50 (H) 12/28/2019 1539   BUN 58 (H) 12/27/2019 0632   CREATININE 3.97 (H) 12/29/2019 0612   CREATININE 3.97 (H) 12/28/2019 1539   CREATININE 4.40 (H) 12/27/2019 0632   CALCIUM 8.5 (L) 12/29/2019 0612   CALCIUM 8.5 (L) 12/28/2019 1539   CALCIUM 8.8  (L) 12/27/2019 0632   GFRNONAA 15 (L) 12/29/2019 0612   GFRNONAA 15 (L) 12/28/2019 1539   GFRNONAA 13 (L) 12/27/2019 0632   GFRAA 17 (L) 12/29/2019 0612   GFRAA 17 (L) 12/28/2019 1539   GFRAA 15 (L) 12/27/2019 0632   CBC    Component Value Date/Time   WBC 17.7 (H) 12/29/2019 0612   RBC 3.45 (L) 12/29/2019 0612   HGB 10.0 (L) 12/29/2019 0612   HCT 31.9 (L) 12/29/2019 0612   PLT 124 (L) 12/29/2019 0612   MCV 92.5 12/29/2019 0612   MCH 29.0 12/29/2019 0612   MCHC 31.3 12/29/2019 0612   RDW 16.5 (H) 12/29/2019 0612   HEPATIC Function Panel Recent Labs    12/16/19 0707  PROT 6.3*   HEMOGLOBIN A1C No components found for: HGA1C,  MPG CARDIAC ENZYMES Lab Results  Component Value Date   CKTOTAL 25 (L) 12/28/2019   CKMB 4.2 12/28/2019   BNP No results for input(s): PROBNP in the last 8760 hours. TSH Recent Labs    12/16/19 0707 12/20/19 0504  TSH 16.546* 19.591*   CHOLESTEROL No results for input(s): CHOL in the last 8760 hours.  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 Prognosis poor as not a candidate for cardiac intervention for multiple medical conditions.   LOS: 0 days   Time spent including chart review, lab review, examination, discussion with patient, nurse and PA : 30 min   Dixie Dials  MD  12/29/2019, 10:40 AM

## 2019-12-30 DIAGNOSIS — I482 Chronic atrial fibrillation, unspecified: Secondary | ICD-10-CM | POA: Diagnosis not present

## 2019-12-30 DIAGNOSIS — J9621 Acute and chronic respiratory failure with hypoxia: Secondary | ICD-10-CM | POA: Diagnosis not present

## 2019-12-30 DIAGNOSIS — J90 Pleural effusion, not elsewhere classified: Secondary | ICD-10-CM | POA: Diagnosis not present

## 2019-12-30 DIAGNOSIS — I5022 Chronic systolic (congestive) heart failure: Secondary | ICD-10-CM | POA: Diagnosis not present

## 2019-12-30 LAB — CULTURE, BLOOD (ROUTINE X 2)
Culture: NO GROWTH
Culture: NO GROWTH
Special Requests: ADEQUATE
Special Requests: ADEQUATE

## 2019-12-30 LAB — RENAL FUNCTION PANEL
Albumin: 1.5 g/dL — ABNORMAL LOW (ref 3.5–5.0)
Anion gap: 13 (ref 5–15)
BUN: 66 mg/dL — ABNORMAL HIGH (ref 8–23)
CO2: 24 mmol/L (ref 22–32)
Calcium: 8.3 mg/dL — ABNORMAL LOW (ref 8.9–10.3)
Chloride: 93 mmol/L — ABNORMAL LOW (ref 98–111)
Creatinine, Ser: 4.3 mg/dL — ABNORMAL HIGH (ref 0.61–1.24)
GFR calc Af Amer: 15 mL/min — ABNORMAL LOW (ref >60)
GFR calc non Af Amer: 13 mL/min — ABNORMAL LOW (ref >60)
Glucose, Bld: 143 mg/dL — ABNORMAL HIGH (ref 70–99)
Phosphorus: 3.1 mg/dL (ref 2.5–4.6)
Potassium: 3.4 mmol/L — ABNORMAL LOW (ref 3.5–5.1)
Sodium: 130 mmol/L — ABNORMAL LOW (ref 135–145)

## 2019-12-30 NOTE — Consult Note (Signed)
Infectious Disease Consultation   Jose Tucker  CBS:[id number]  DOB: December 11, 1950  DOA: 01/02/2020   Requesting physician: Dr.Hizaji  Reason for consultation: Antibiotic recommendations   History of Present Illness: Jose Tucker is an 69 y.o. male with multiple medical problems including congestive heart failure, valvular heart disease, hypertension, diabetes mellitus, dyslipidemia, obstructive sleep apnea, chronic lower extremity venous stasis ulcers, obesity was admitted to the acute hospital after falling out of bed and lying for 48 hours.  Patient developed rhabdomyolysis and also had renal failure.  He also had atrial fibrillation with RVR.  There was some question of underlying infectious process.  He was found to have elevated troponin on the cardiac work-up.  Echo showed 50-55% EF.  He underwent left heart cath which showed nonobstructive coronary artery disease.  Patient also had end-stage renal disease and nephrology was consulted for hemodialysis.  Responded poorly and started to gain weight.  Repeat echo showed reduced ejection fraction at 35%.  He had some complications in the AV fistula clotted off and had to be removed.  Patient also had profuse bleeding from the site with severe anemia and received 7 units of PRBCs.  Due to the severity of the patient's illness, worsening peripheral vascular disease he developed necrotic toes.  Angiogram showed severe distal right SFA stenosis along with total occlusion of the distal right popliteal, mid left SFA and popliteal disease, diffuse bilateral tibial outflow disease.  Due to the patient's diabetes and other comorbidities plan for lower extremity intervention scheduled for outpatient once his acute issues resolved.  His ongoing congestive heart failure was complicated by severe aortic stenosis.  Consideration for TAVR was done.  He was intubated for procedure but had complicated post intubation.  He developed PEA and respiratory arrest.  He  also developed V. tach requiring shock.  He had to be placed on pressors and intubated.  He subsequently developed left thigh hematoma/cellulitis which was treated with IV antibiotics. Tracheostomy was placed.  PEG tube was placed as well.  Due to his complex medical problems he was transferred to [facility] on 12/27/2019.  He was noted to have worsening leukocytosis therefore had respiratory cultures from here that showed Proteus mirabilis.  On cefepime and Flagyl.  However, he also had diarrhea and stool for C. difficile was positive on 12/26/2019.  He had episode of rapid response/CODE BLUE was called yesterday due to PEA arrest and hypoxemia.  Patient apparently was noted to have unresponsiveness after initiating hemodialysis.  He is currently on 60% FiO2, PEEP of 5.  Review of Systems:  Unable to obtain review of systems   Past Medical History: Past Medical History:  Diagnosis Date  . Acute on chronic respiratory failure with hypoxia ([place])   . Bilateral pleural effusion   . Chronic atrial fibrillation ([place])   . Chronic systolic (congestive) heart failure ([place])   . End stage renal disease on dialysis ([place])   Diabetes mellitus, hypertension, hyperlipidemia  Past Surgical History: Procedure Laterality Date  . CHG ANGIO EXTERMITY BILAT Bilateral 11/05/2019  Procedure: ABDOMINAL AORTAGRAM WITH BILATERAL RUNOFF AND POSSIBLE REVASCULARIZATION; Surgeon: Irene Limbo, MD; Location: [facility]; Service: Cardiology  . PR CATH PLACE/CORON ANGIO, IMG SUPER/INTERP,R&L HRT CATH, L HRT VENTRIC N/A 10/12/2019  Procedure: Left/Right Heart Catheterization W Intervention; Surgeon: Jerrye Beavers, MD; Location: [facility]; Service: Cardiology  . PR PERC CLOS,CONG INTERATRIAL COMMUN W/IMPL N/A 11/05/2019  Procedure: BALLOON AORTIC VALVULOPLASTY; Surgeon: Baxter Flattery, MD; Location: [facility]; Service: Cardiology  .  PR TRACHEOSTOMY, PLANNED N/A 11/28/2019  Procedure: TRACHEOSTOMY;  Surgeon: Kennyth Lose, MD; Location: [facility]; Service: ENT   Allergies: No known drug allergies  Social History: Unable to obtain at this time  Family History: Unable to obtain at this time   Physical Exam: Vitals: Temperature 96.2, pulse 107, respiratory rate 33, blood pressure 122/60, pulse oximetry 98% on 60% FiO2, PEEP of 5 Constitutional: Ill-appearing male, opening eyes but not following commands Eyes: PERLA ENMT: external ears and nose appear normal, moist oral mucosa Neck: Has trach in place CVS: S1-S2, murmur Respiratory: Rhonchi, no wheezing Abdomen: soft nontender, nondistended, normal bowel sounds Musculoskeletal: Lower extremity edema Neuro: He is opening eyes, not really following commands at this time Psych: Unable to assess  skin: no rashes  Data reviewed:  I have personally reviewed following labs and imaging studies Labs:  CBC: Recent Labs  Lab 12/24/19 0421 12/25/19 1716 12/27/19 0632 12/28/19 1430 12/29/19 0612  WBC 21.8* 27.9* 23.0* 21.4* 17.7*  HGB 8.8* 9.5* 9.8* 10.0* 10.0*  HCT 28.5* 32.7* 31.9* 31.6* 31.9*  MCV 97.3 103.2* 95.2 94.3 92.5  PLT 224 227 177 122* 124*    Basic Metabolic Panel: Recent Labs  Lab 12/24/19 0421 12/24/19 0421 12/25/19 1716 12/25/19 1716 12/27/19 0632 12/27/19 0632 12/28/19 1539 12/28/19 1539 12/29/19 0612 12/30/19 0534  NA 133*   < > 139  --  132*  --  133*  --  131* 130*  K 3.7   < > 3.5   < > 3.7   < > 3.7   < > 3.6 3.4*  CL 95*   < > 99  --  95*  --  94*  --  94* 93*  CO2 26   < > 23  --  24  --  25  --  25 24  GLUCOSE 162*   < > 94  --  145*  --  181*  --  161* 143*  BUN 42*   < > 37*  --  58*  --  50*  --  57* 66*  CREATININE 3.97*   < > 3.80*  --  4.40*  --  3.97*  --  3.97* 4.30*  CALCIUM 8.5*   < > 8.9  --  8.8*  --  8.5*  --  8.5* 8.3*  MG  --   --  1.9  --   --   --  1.8  --   --   --   PHOS 2.2*  --  2.1*  --  1.8*  --   --   --  1.7* 3.1   < > = values in this interval not displayed.    GFR CrCl cannot be calculated (Unknown ideal weight.). Liver Function Tests: Recent Labs  Lab 12/24/19 0421 12/25/19 1716 12/27/19 6283 12/29/19 0612 12/30/19 0534  ALBUMIN 2.0* 2.1* 1.8* 1.6* 1.5*   No results for input(s): LIPASE, AMYLASE in the last 168 hours. No results for input(s): AMMONIA in the last 168 hours. Coagulation profile No results for input(s): INR, PROTIME in the last 168 hours.  Cardiac Enzymes: Recent Labs  Lab 12/25/19 1716 12/28/19 1539  CKTOTAL 86 25*  CKMB 13.3* 4.2   BNP: Invalid input(s): POCBNP CBG: No results for input(s): GLUCAP in the last 168 hours. D-Dimer No results for input(s): DDIMER in the last 72 hours. Hgb A1c No results for input(s): HGBA1C in the last 72 hours. Lipid Profile No results for input(s): CHOL, HDL, LDLCALC, TRIG, CHOLHDL,  LDLDIRECT in the last 72 hours. Thyroid function studies No results for input(s): TSH, T4TOTAL, T3FREE, THYROIDAB in the last 72 hours.  Invalid input(s): FREET3 Anemia work up No results for input(s): VITAMINB12, FOLATE, FERRITIN, TIBC, IRON, RETICCTPCT in the last 72 hours. Urinalysis No results found for: COLORURINE, APPEARANCEUR, LABSPEC, [place], GLUCOSEU, [place], [place], [place], PROTEINUR, UROBILINOGEN, NITRITE, [place]   Microbiology Recent Results (from the past 240 hour(s))  Culture, respiratory (non-expectorated)     Status: None   Collection Time: 12/24/19  4:00 PM   Specimen: Tracheal Aspirate; Respiratory  Result Value Ref Range Status   Specimen Description TRACHEAL ASPIRATE  Final   Special Requests NONE  Final   Gram Stain   Final    ABUNDANT WBC PRESENT, PREDOMINANTLY PMN ABUNDANT GRAM POSITIVE RODS FEW GRAM NEGATIVE RODS Performed at [facility], [place] [address] [place], [place] [address]    Culture MODERATE PROTEUS MIRABILIS  Final   Report Status 12/26/2019 FINAL  Final   Organism ID, Bacteria PROTEUS MIRABILIS  Final      Susceptibility    Proteus mirabilis - MIC*    AMPICILLIN <=2 SENSITIVE Sensitive     CEFAZOLIN 8 SENSITIVE Sensitive     CEFEPIME <=0.12 SENSITIVE Sensitive     CEFTAZIDIME <=1 SENSITIVE Sensitive     CEFTRIAXONE <=0.25 SENSITIVE Sensitive     CIPROFLOXACIN <=0.25 SENSITIVE Sensitive     GENTAMICIN <=1 SENSITIVE Sensitive     IMIPENEM 4 SENSITIVE Sensitive     TRIMETH/SULFA <=20 SENSITIVE Sensitive     AMPICILLIN/SULBACTAM <=2 SENSITIVE Sensitive     PIP/TAZO <=4 SENSITIVE Sensitive     * MODERATE PROTEUS MIRABILIS  Culture, blood (routine x 2)     Status: None   Collection Time: 12/25/19  5:16 PM   Specimen: BLOOD  Result Value Ref Range Status   Specimen Description BLOOD RIGHT ANTECUBITAL  Final   Special Requests   Final    BOTTLES DRAWN AEROBIC AND ANAEROBIC Blood Culture adequate volume   Culture   Final    NO GROWTH 5 DAYS Performed at [facility], [address] [place], [place] [address]    Report Status 12/30/2019 FINAL  Final  Culture, blood (routine x 2)     Status: None   Collection Time: 12/25/19  5:18 PM   Specimen: BLOOD  Result Value Ref Range Status   Specimen Description BLOOD RIGHT ANTECUBITAL  Final   Special Requests   Final    BOTTLES DRAWN AEROBIC AND ANAEROBIC Blood Culture adequate volume   Culture   Final    NO GROWTH 5 DAYS Performed at [facility], [place] [address], [place], [place] [address]    Report Status 12/30/2019 FINAL  Final  C difficile quick scan w PCR reflex     Status: Abnormal   Collection Time: 12/26/19 12:11 PM   Specimen: STOOL  Result Value Ref Range Status   C Diff antigen POSITIVE (A) NEGATIVE Final   C Diff toxin POSITIVE (A) NEGATIVE Final    Comment: CRITICAL RESULT CALLED TO, READ BACK BY AND VERIFIED WITH:  RN CHRISSY R. @1817  12/26/2019 AKT    C Diff interpretation Toxin producing C. difficile detected.  Final       Inpatient Medications:   Scheduled Meds: Continuous Infusions:   Radiological Exams on  Admission: CT HEAD WO CONTRAST  Result Date: 12/29/2019 CLINICAL DATA:  Unresponsive, encephalopathy EXAM: CT HEAD WITHOUT CONTRAST TECHNIQUE: Contiguous axial images were obtained  from the base of the skull through the vertex without intravenous contrast. COMPARISON:  None. FINDINGS: Brain: Evaluation is slightly limited due to patient motion. Hypodensity within the inferior aspect right cerebellar hemisphere consistent with age-indeterminate ischemic change. No evidence of acute hemorrhage. Lateral ventricles and remaining midline structures are unremarkable. No acute extra-axial fluid collections. No mass effect. Vascular: No hyperdense vessel or unexpected calcification. Skull: Normal. Negative for fracture or focal lesion. Sinuses/Orbits: Near complete opacification left sphenoid sinus. Remaining sinuses are clear. Other: None IMPRESSION: 1. Hypodensity right cerebellar hemisphere compatible with age indeterminate infarct. 2. No evidence of hemorrhage. 3. Limited study due to patient motion. Electronically Signed   By: Randa Ngo M.D.   On: 12/29/2019 15:09    Impression/Recommendations Active Problems:   Acute on chronic respiratory failure with hypoxia (HCC)   Chronic systolic (congestive) heart failure (HCC)   End stage renal disease on dialysis (HCC)   Chronic atrial fibrillation (HCC)   Bilateral pleural effusion Pneumonia with Proteus mirabilis C. difficile infection Leukocytosis Dysphagia Diabetes mellitus type 2 Status post PEA arrest Moderate to severe aortic stenosis Sacral pressure ulcer unspecified stage  Acute on chronic respiratory failure with hypoxemia: Patient currently ventilator dependent.  He recently had episode of pulseless electrical activity arrest/CODE BLUE while weaning on the ventilator.  His previous respiratory cultures showed Proteus mirabilis.  On treatment with cefepime.  We will plan to treat for duration of 1 week pending improvement.  However, he also  has clustering difficile infection and therefore on oral vancomycin which recommend to continue while on the antibiotic and also for 5 to 7 days after completion of the antibiotic.  Non-ST elevation MI/chronic congestive heart failure: He recently had PEA arrest and was resuscitated.  Likely secondary to aspiration?  Unfortunately placed back on the ventilator.  On pressors.  On cefepime.  Added Flagyl.  He also has moderate to severe aortic stenosis.  Cardiology following.  Pneumonia: As mentioned above respiratory culture showed Proteus mirabilis.  He also has dysphagia and high concern for aspiration therefore added Flagyl to the cefepime.  We will plan to treat for duration of 1 week pending improvement.  However, because of his dysphagia and high concern for aspiration he is likely to have worsening respiratory failure despite being on antibiotics secondary to aspiration pneumonia.  Clostridium difficile infection: On oral vancomycin.  Would recommend to continue while on treatment with the cefepime and also for 5 to 7 days after completion of the antibiotic.  Leukocytosis: Likely secondary to the pneumonia, C. difficile infection.  He also has dysphagia and high concern for ongoing aspiration which could be contributing to the WBC count.  Antibiotics as mentioned above.  Continue to monitor.  End-stage renal disease on dialysis: Nephrology following.  Dialysis per nephrology.  Antibiotics renally dosed.  Diabetes mellitus type 2: Continue to monitor Accu-Cheks, management of diabetes per the primary team.  Dysphagia: As mentioned above, due to his dysphagia he is high risk for worsening respiratory failure and worsening pneumonia secondary to aspiration.  Sacral pressure ulcer unspecified stage: Continue local wound care.  Unfortunately due to his debility is high risk for worsening of the pressure ulcer. Due to his complex medical problems he is high risk for worsening and  decompensation.  Thank you for this consultation.  Plan of care discussed with the primary team.  Yaakov Guthrie M.D. 12/30/2019, 5:34 PM

## 2019-12-30 NOTE — Consult Note (Signed)
Ref: Patient, No Pcp Per   Subjective:  Awake. Hemodialysis cancelled yesterday due to neurologic event. IV Levophed is down to 14 mcg/min. Patient has severe aortic stenosis but he is not surgical candidate for valve replacement.  Objective:  Vital Signs in the last 24 hours:  P:105, R: 30, BP: 120/60. O2 sat 98 % on FiO2 60 % and 5 of PEEP.  Physical Exam: BP Readings from Last 1 Encounters:  No data found for BP     Wt Readings from Last 1 Encounters:  No data found for Wt    Weight change:  There is no height or weight on file to calculate BMI. HEENT: [place]/AT, Eyes-Brown, PERL, EOMI, Conjunctiva-Pale, Sclera-Non-icteric Neck: No JVD, No bruit, Trachea midline. Lungs:  Rhonchi, Bilateral. Cardiac:  Irregular rhythm, normal S1 and S2, no S3. III/VI systolic murmur. Abdomen:  Soft, non-tender. BS present. Extremities:  1 + edema present. No cyanosis. No clubbing. Lower leg venous stasis dermatosis. CNS: AxOx1, Cranial nerves grossly intact, moves all 4 extremities.  Skin: Warm and dry.   Intake/Output from previous day: No intake/output data recorded.    Lab Results: BMET    Component Value Date/Time   NA 130 (L) 12/30/2019 0534   NA 131 (L) 12/29/2019 0612   NA 133 (L) 12/28/2019 1539   K 3.4 (L) 12/30/2019 0534   K 3.6 12/29/2019 0612   K 3.7 12/28/2019 1539   CL 93 (L) 12/30/2019 0534   CL 94 (L) 12/29/2019 0612   CL 94 (L) 12/28/2019 1539   CO2 24 12/30/2019 0534   CO2 25 12/29/2019 0612   CO2 25 12/28/2019 1539   GLUCOSE 143 (H) 12/30/2019 0534   GLUCOSE 161 (H) 12/29/2019 0612   GLUCOSE 181 (H) 12/28/2019 1539   BUN 66 (H) 12/30/2019 0534   BUN 57 (H) 12/29/2019 0612   BUN 50 (H) 12/28/2019 1539   CREATININE 4.30 (H) 12/30/2019 0534   CREATININE 3.97 (H) 12/29/2019 0612   CREATININE 3.97 (H) 12/28/2019 1539   CALCIUM 8.3 (L) 12/30/2019 0534   CALCIUM 8.5 (L) 12/29/2019 0612   CALCIUM 8.5 (L) 12/28/2019 1539   GFRNONAA 13 (L) 12/30/2019 0534   GFRNONAA 15 (L) 12/29/2019 0612   GFRNONAA 15 (L) 12/28/2019 1539   GFRAA 15 (L) 12/30/2019 0534   GFRAA 17 (L) 12/29/2019 0612   GFRAA 17 (L) 12/28/2019 1539   CBC    Component Value Date/Time   WBC 17.7 (H) 12/29/2019 0612   RBC 3.45 (L) 12/29/2019 0612   HGB 10.0 (L) 12/29/2019 0612   HCT 31.9 (L) 12/29/2019 0612   PLT 124 (L) 12/29/2019 0612   MCV 92.5 12/29/2019 0612   MCH 29.0 12/29/2019 0612   MCHC 31.3 12/29/2019 0612   RDW 16.5 (H) 12/29/2019 0612   HEPATIC Function Panel Recent Labs    12/16/19 0707  PROT 6.3*   HEMOGLOBIN A1C No components found for: HGA1C,  MPG CARDIAC ENZYMES Lab Results  Component Value Date   CKTOTAL 25 (L) 12/28/2019   CKMB 4.2 12/28/2019   BNP No results for input(s): PROBNP in the last 8760 hours. TSH Recent Labs    12/16/19 0707 12/20/19 0504  TSH 16.546* 19.591*   CHOLESTEROL No results for input(s): CHOL in the last 8760 hours.  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

## 2019-12-30 NOTE — Progress Notes (Addendum)
Pulmonary [place]   PULMONARY CRITICAL CARE SERVICE  PROGRESS NOTE  Date of Service: 12/30/2019  Ahmet Schank  ZOX:[id number]  DOB: 07/25/1951   DOA: 12/14/2019  Referring Physician: Merton Border, MD  HPI: Jose Tucker is a 69 y.o. male seen for follow up of Acute on Chronic Respiratory Failure.  Patient continues to wean medical support at this time, rate 25 and FiO2 50%.  Satting well no distress.  Medications: Reviewed on Rounds  Physical Exam:  Vitals: Pulse 107 respirations 29 BP 107/80 O2 sat 95% temp 96.5  Ventilator Settings ventilator mode AC VC rate of 25 tidal volume 500 PEEP of 5 and FiO2 of 50%  . General: Comfortable at this time . Eyes: Grossly normal lids, irises & conjunctiva . ENT: grossly tongue is normal . Neck: no obvious mass . Cardiovascular: S1 S2 normal no gallop . Respiratory: No rales or rhonchi noted . Abdomen: soft . Skin: no rash seen on limited exam . Musculoskeletal: not rigid . Psychiatric:unable to assess . Neurologic: no seizure no involuntary movements         Lab Data:   Basic Metabolic Panel: Recent Labs  Lab 12/24/19 0421 12/24/19 0421 12/25/19 1716 12/27/19 8119 12/28/19 1539 12/29/19 0612 12/30/19 0534  NA 133*   < > 139 132* 133* 131* 130*  K 3.7   < > 3.5 3.7 3.7 3.6 3.4*  CL 95*   < > 99 95* 94* 94* 93*  CO2 26   < > 23 24 25 25 24   GLUCOSE 162*   < > 94 145* 181* 161* 143*  BUN 42*   < > 37* 58* 50* 57* 66*  CREATININE 3.97*   < > 3.80* 4.40* 3.97* 3.97* 4.30*  CALCIUM 8.5*   < > 8.9 8.8* 8.5* 8.5* 8.3*  MG  --   --  1.9  --  1.8  --   --   PHOS 2.2*  --  2.1* 1.8*  --  1.7* 3.1   < > = values in this interval not displayed.    ABG: Recent Labs  Lab 12/25/19 1716 12/25/19 2015 12/28/19 1445 12/28/19 2000  PHART  --  7.375 7.297* 7.441  PCO2ART  --  43.2 51.8* 38.7  PO2ART  --  147* 109* 154*  HCO3 25.0 24.6 24.5 26.3  O2SAT 31.7 99.5 97.9 99.6    Liver  Function Tests: Recent Labs  Lab 12/24/19 0421 12/25/19 1716 12/27/19 0632 12/29/19 0612 12/30/19 0534  ALBUMIN 2.0* 2.1* 1.8* 1.6* 1.5*   No results for input(s): LIPASE, AMYLASE in the last 168 hours. No results for input(s): AMMONIA in the last 168 hours.  CBC: Recent Labs  Lab 12/24/19 0421 12/25/19 1716 12/27/19 0632 12/28/19 1430 12/29/19 0612  WBC 21.8* 27.9* 23.0* 21.4* 17.7*  HGB 8.8* 9.5* 9.8* 10.0* 10.0*  HCT 28.5* 32.7* 31.9* 31.6* 31.9*  MCV 97.3 103.2* 95.2 94.3 92.5  PLT 224 227 177 122* 124*    Cardiac Enzymes: Recent Labs  Lab 12/25/19 1716 12/28/19 1539  CKTOTAL 86 25*  CKMB 13.3* 4.2    BNP (last 3 results) No results for input(s): BNP in the last 8760 hours.  ProBNP (last 3 results) No results for input(s): PROBNP in the last 8760 hours.  Radiological Exams: CT HEAD WO CONTRAST  Result Date: 12/29/2019 CLINICAL DATA:  Unresponsive, encephalopathy EXAM: CT HEAD WITHOUT CONTRAST TECHNIQUE: Contiguous axial images were obtained from the base of the skull through the  vertex without intravenous contrast. COMPARISON:  None. FINDINGS: Brain: Evaluation is slightly limited due to patient motion. Hypodensity within the inferior aspect right cerebellar hemisphere consistent with age-indeterminate ischemic change. No evidence of acute hemorrhage. Lateral ventricles and remaining midline structures are unremarkable. No acute extra-axial fluid collections. No mass effect. Vascular: No hyperdense vessel or unexpected calcification. Skull: Normal. Negative for fracture or focal lesion. Sinuses/Orbits: Near complete opacification left sphenoid sinus. Remaining sinuses are clear. Other: None IMPRESSION: 1. Hypodensity right cerebellar hemisphere compatible with age indeterminate infarct. 2. No evidence of hemorrhage. 3. Limited study due to patient motion. Electronically Signed   By: Randa Ngo M.D.   On: 12/29/2019 15:09    Assessment/Plan Active Problems:    Acute on chronic respiratory failure with hypoxia (HCC)   Chronic systolic (congestive) heart failure (HCC)   End stage renal disease on dialysis [facility])   Chronic atrial fibrillation (HCC)   Bilateral pleural effusion   1. Acute on chronic respiratory failure with hypoxiacontinue support ventilator at this time we will continue to wean as tolerated.  Continue supportive measures aggressive pulmonary toilet. 2. Chronic systolic heart failure right now is compensated we will continue present management 3. End-stage renal disease on dialysis 4. Chronic atrial fibrillation rate controlled 5. Bilateral pleural effusionwith pulmonary edema post code.   I have personally seen and evaluated the patient, evaluated laboratory and imaging results, formulated the assessment and plan and placed orders. The Patient requires high complexity decision making with multiple systems involvement.  Rounds were done with the Respiratory Therapy Director and Staff therapists and discussed with nursing staff also.  Allyne Gee, MD [facility] Pulmonary Critical Care Medicine Sleep Medicine

## 2019-12-31 DIAGNOSIS — I5022 Chronic systolic (congestive) heart failure: Secondary | ICD-10-CM | POA: Diagnosis not present

## 2019-12-31 DIAGNOSIS — J90 Pleural effusion, not elsewhere classified: Secondary | ICD-10-CM | POA: Diagnosis not present

## 2019-12-31 DIAGNOSIS — I482 Chronic atrial fibrillation, unspecified: Secondary | ICD-10-CM | POA: Diagnosis not present

## 2019-12-31 DIAGNOSIS — J9621 Acute and chronic respiratory failure with hypoxia: Secondary | ICD-10-CM | POA: Diagnosis not present

## 2019-12-31 LAB — RENAL FUNCTION PANEL
Albumin: 1.4 g/dL — ABNORMAL LOW (ref 3.5–5.0)
Anion gap: 13 (ref 5–15)
BUN: 84 mg/dL — ABNORMAL HIGH (ref 8–23)
CO2: 22 mmol/L (ref 22–32)
Calcium: 8.1 mg/dL — ABNORMAL LOW (ref 8.9–10.3)
Chloride: 93 mmol/L — ABNORMAL LOW (ref 98–111)
Creatinine, Ser: 4.74 mg/dL — ABNORMAL HIGH (ref 0.61–1.24)
GFR calc Af Amer: 14 mL/min — ABNORMAL LOW (ref >60)
GFR calc non Af Amer: 12 mL/min — ABNORMAL LOW (ref >60)
Glucose, Bld: 158 mg/dL — ABNORMAL HIGH (ref 70–99)
Phosphorus: 2.8 mg/dL (ref 2.5–4.6)
Potassium: 3.6 mmol/L (ref 3.5–5.1)
Sodium: 128 mmol/L — ABNORMAL LOW (ref 135–145)

## 2019-12-31 LAB — CBC
HCT: 23.9 % — ABNORMAL LOW (ref 39.0–52.0)
HCT: 24 % — ABNORMAL LOW (ref 39.0–52.0)
Hemoglobin: 7.6 g/dL — ABNORMAL LOW (ref 13.0–17.0)
Hemoglobin: 7.7 g/dL — ABNORMAL LOW (ref 13.0–17.0)
MCH: 29.3 pg (ref 26.0–34.0)
MCH: 29.3 pg (ref 26.0–34.0)
MCHC: 31.8 g/dL (ref 30.0–36.0)
MCHC: 32.1 g/dL (ref 30.0–36.0)
MCV: 92.3 fL (ref 80.0–100.0)
MCV: 91.3 fL (ref 80.0–100.0)
Platelets: 95 10*3/uL — ABNORMAL LOW (ref 150–400)
Platelets: 78 10*3/uL — ABNORMAL LOW (ref 150–400)
RBC: 2.59 MIL/uL — ABNORMAL LOW (ref 4.22–5.81)
RBC: 2.63 MIL/uL — ABNORMAL LOW (ref 4.22–5.81)
RDW: 16.7 % — ABNORMAL HIGH (ref 11.5–15.5)
RDW: 16.5 % — ABNORMAL HIGH (ref 11.5–15.5)
WBC: 14.6 10*3/uL — ABNORMAL HIGH (ref 4.0–10.5)
WBC: 14.6 10*3/uL — ABNORMAL HIGH (ref 4.0–10.5)
nRBC: 0 % (ref 0.0–0.2)
nRBC: 0 % (ref 0.0–0.2)

## 2019-12-31 LAB — MAGNESIUM: Magnesium: 1.9 mg/dL (ref 1.7–2.4)

## 2019-12-31 NOTE — Progress Notes (Signed)
[facility]  ROUNDING NOTE   Subjective:  Critical illness persist at the moment. Still on the ventilator. Currently hypotensive with blood pressure 94/50 while on norepinephrine. Primary team discussed goals of care with the family however it appears that they desire ongoing aggressive care.  Objective:  Vital signs in last 24 hours:  Temperature 96.8 pulse 101 respirations 21 blood pressure 94/50  Physical Exam: General: Critically ill-appearing  Head: Normocephalic, atraumatic. Moist oral mucosal membranes  Eyes: Anicteric  Neck: Tracheostomy in place  Lungs:  Scattered rhonchi bilateral, vent assisted  Heart: S1S2 irregular  Abdomen:  Soft, nontender, bowel sounds present  Extremities: Trace peripheral edema.  Neurologic: Awake, alert, following commands  Skin: No lesions  Access: Right IJ PermCath    Basic Metabolic Panel: Recent Labs  Lab 12/25/19 1716 12/25/19 1716 12/27/19 3762 12/27/19 8315 12/28/19 1539 12/29/19 0612 12/30/19 0534  NA 139  --  132*  --  133* 131* 130*  K 3.5  --  3.7  --  3.7 3.6 3.4*  CL 99  --  95*  --  94* 94* 93*  CO2 23  --  24  --  25 25 24   GLUCOSE 94  --  145*  --  181* 161* 143*  BUN 37*  --  58*  --  50* 57* 66*  CREATININE 3.80*  --  4.40*  --  3.97* 3.97* 4.30*  CALCIUM 8.9   < > 8.8*   < > 8.5* 8.5* 8.3*  MG 1.9  --   --   --  1.8  --   --   PHOS 2.1*  --  1.8*  --   --  1.7* 3.1   < > = values in this interval not displayed.    Liver Function Tests: Recent Labs  Lab 12/25/19 1716 12/27/19 1761 12/29/19 0612 12/30/19 0534  ALBUMIN 2.1* 1.8* 1.6* 1.5*   No results for input(s): LIPASE, AMYLASE in the last 168 hours. No results for input(s): AMMONIA in the last 168 hours.  CBC: Recent Labs  Lab 12/25/19 1716 12/27/19 0632 12/28/19 1430 12/29/19 0612  WBC 27.9* 23.0* 21.4* 17.7*  HGB 9.5* 9.8* 10.0* 10.0*  HCT 32.7* 31.9* 31.6* 31.9*  MCV 103.2* 95.2 94.3 92.5  PLT 227 177 122* 124*     Cardiac Enzymes: Recent Labs  Lab 12/25/19 1716 12/28/19 1539  CKTOTAL 86 25*  CKMB 13.3* 4.2    BNP: Invalid input(s): POCBNP  CBG: No results for input(s): GLUCAP in the last 168 hours.  Microbiology: Results for orders placed or performed during the hospital encounter of 12/27/2019  C difficile quick scan w PCR reflex     Status: None   Collection Time: 12/18/19  4:49 AM   Specimen: STOOL  Result Value Ref Range Status   C Diff antigen NEGATIVE NEGATIVE Final   C Diff toxin NEGATIVE NEGATIVE Final   C Diff interpretation No C. difficile detected.  Corrected    Comment: Performed at [facility], [place] [address] [place], [place] [address] CORRECTED ON 03/13 AT 1510: PREVIOUSLY REPORTED AS VALID   Culture, respiratory (non-expectorated)     Status: None   Collection Time: 12/24/19  4:00 PM   Specimen: Tracheal Aspirate; Respiratory  Result Value Ref Range Status   Specimen Description TRACHEAL ASPIRATE  Final   Special Requests NONE  Final   Gram Stain   Final    ABUNDANT WBC PRESENT, PREDOMINANTLY PMN ABUNDANT GRAM POSITIVE RODS FEW  GRAM NEGATIVE RODS Performed at [facility], [place] [address], [place], [place] [address]    Culture MODERATE PROTEUS MIRABILIS  Final   Report Status 12/26/2019 FINAL  Final   Organism ID, Bacteria PROTEUS MIRABILIS  Final      Susceptibility   Proteus mirabilis - MIC*    AMPICILLIN <=2 SENSITIVE Sensitive     CEFAZOLIN 8 SENSITIVE Sensitive     CEFEPIME <=0.12 SENSITIVE Sensitive     CEFTAZIDIME <=1 SENSITIVE Sensitive     CEFTRIAXONE <=0.25 SENSITIVE Sensitive     CIPROFLOXACIN <=0.25 SENSITIVE Sensitive     GENTAMICIN <=1 SENSITIVE Sensitive     IMIPENEM 4 SENSITIVE Sensitive     TRIMETH/SULFA <=20 SENSITIVE Sensitive     AMPICILLIN/SULBACTAM <=2 SENSITIVE Sensitive     PIP/TAZO <=4 SENSITIVE Sensitive     * MODERATE PROTEUS MIRABILIS  Culture, blood (routine x 2)     Status: None   Collection Time: 12/25/19   5:16 PM   Specimen: BLOOD  Result Value Ref Range Status   Specimen Description BLOOD RIGHT ANTECUBITAL  Final   Special Requests   Final    BOTTLES DRAWN AEROBIC AND ANAEROBIC Blood Culture adequate volume   Culture   Final    NO GROWTH 5 DAYS Performed at [facility], [address], [place],  [address]    Report Status 12/30/2019 FINAL  Final  Culture, blood (routine x 2)     Status: None   Collection Time: 12/25/19  5:18 PM   Specimen: BLOOD  Result Value Ref Range Status   Specimen Description BLOOD RIGHT ANTECUBITAL  Final   Special Requests   Final    BOTTLES DRAWN AEROBIC AND ANAEROBIC Blood Culture adequate volume   Culture   Final    NO GROWTH 5 DAYS Performed at [facility], [place] [address] [place],  [address]    Report Status 12/30/2019 FINAL  Final  C difficile quick scan w PCR reflex     Status: Abnormal   Collection Time: 12/26/19 12:11 PM   Specimen: STOOL  Result Value Ref Range Status   C Diff antigen POSITIVE (A) NEGATIVE Final   C Diff toxin POSITIVE (A) NEGATIVE Final    Comment: CRITICAL RESULT CALLED TO, READ BACK BY AND VERIFIED WITH:  RN CHRISSY R. @1817  12/26/2019 AKT    C Diff interpretation Toxin producing C. difficile detected.  Final    Coagulation Studies: No results for input(s): LABPROT, INR in the last 72 hours.  Urinalysis: No results for input(s): COLORURINE, LABSPEC, PHURINE, GLUCOSEU, HGBUR, BILIRUBINUR, KETONESUR, PROTEINUR, UROBILINOGEN, NITRITE, LEUKOCYTESUR in the last 72 hours.  Invalid input(s): APPERANCEUR    Imaging: CT HEAD WO CONTRAST  Result Date: 12/29/2019 CLINICAL DATA:  Unresponsive, encephalopathy EXAM: CT HEAD WITHOUT CONTRAST TECHNIQUE: Contiguous axial images were obtained from the base of the skull through the vertex without intravenous contrast. COMPARISON:  None. FINDINGS: Brain: Evaluation is slightly limited due to patient motion. Hypodensity within the inferior aspect right  cerebellar hemisphere consistent with age-indeterminate ischemic change. No evidence of acute hemorrhage. Lateral ventricles and remaining midline structures are unremarkable. No acute extra-axial fluid collections. No mass effect. Vascular: No hyperdense vessel or unexpected calcification. Skull: Normal. Negative for fracture or focal lesion. Sinuses/Orbits: Near complete opacification left sphenoid sinus. Remaining sinuses are clear. Other: None IMPRESSION: 1. Hypodensity right cerebellar hemisphere compatible with age indeterminate infarct. 2. No evidence of hemorrhage. 3. Limited study due to patient motion.  Electronically Signed   By: Randa Ngo M.D.   On: 12/29/2019 15:09     Medications:     iohexol  Assessment/ Plan:  69 y.o. male with a PMHx of ESRD on HD, CHF, aortic stenosis, hypertension, diabetes mellitus type 2, hyperlipidemia, obstructive sleep apnea, chronic venous stasis with LE edema, and obesity who was admitted to [facility] on 12/08/2019 for ongoing treatment of respiratory failure, ESRD, malnutrition, and generalized debility.    1.  ESRD on HD MWF.    Patient did not tolerate dialysis treatment very well at all during the last treatment.  We will attempt dialysis treatment again however if blood pressure again drops we may need to terminate treatment as before.  Patient currently on norepinephrine.  Discussed with hospitalist and we may be able to titrate this to achieve a higher starting pressure.  2.  Acute respiratory failure.    Patient remains ventilator dependent.  Continue current ventilatory support.  3.  Secondary hyperparathyroidism.    Phosphorus up to 3.1 post administration of sodium phosphorus.  4.  Anemia of chronic kidney disease.  Hemoglobin 10.0 at last check.  Maintain the patient on Retacrit.   LOS: 0 Jose Tucker 3/26/20218:37 AM

## 2020-01-02 DIAGNOSIS — I5022 Chronic systolic (congestive) heart failure: Secondary | ICD-10-CM | POA: Diagnosis not present

## 2020-01-02 DIAGNOSIS — I482 Chronic atrial fibrillation, unspecified: Secondary | ICD-10-CM | POA: Diagnosis not present

## 2020-01-02 DIAGNOSIS — J90 Pleural effusion, not elsewhere classified: Secondary | ICD-10-CM | POA: Diagnosis not present

## 2020-01-02 DIAGNOSIS — J9621 Acute and chronic respiratory failure with hypoxia: Secondary | ICD-10-CM | POA: Diagnosis not present

## 2020-01-02 LAB — HEPARIN INDUCED PLATELET AB (HIT ANTIBODY): Heparin Induced Plt Ab: 0.294 OD (ref 0.000–0.400)

## 2020-01-02 NOTE — Progress Notes (Addendum)
Pulmonary Critical Care Medicine [place]   PULMONARY CRITICAL CARE SERVICE  PROGRESS NOTE  Date of Service: 01/02/2020  Jose Tucker  NIO:[id number]  DOB: May 10, 1951   DOA: 12/11/2019  Referring Physician: Merton Border, MD  HPI: Jose Tucker is a 69 y.o. male seen for follow up of Acute on Chronic Respiratory Failure.  Patient is unable to wean and remains on full support at this time with FiO2 55% satting well  Medications: Reviewed on Rounds  Physical Exam:  Vitals: Pulse 105 respirations 21 BP 131/82 O2 sat 99% temp 96  Ventilator Settings ventilator mode AC VC rate of 12 tidal volume 500 PEEP of 5 FiO2 55%  . General: Comfortable at this time . Eyes: Grossly normal lids, irises & conjunctiva . ENT: grossly tongue is normal . Neck: no obvious mass . Cardiovascular: S1 S2 normal no gallop . Respiratory: No rales or rhonchi noted . Abdomen: soft . Skin: no rash seen on limited exam . Musculoskeletal: not rigid . Psychiatric:unable to assess . Neurologic: no seizure no involuntary movements         Lab Data:   Basic Metabolic Panel: Recent Labs  Lab 12/27/19 0632 12/28/19 1539 12/29/19 0612 12/30/19 0534 12/31/19 1444  NA 132* 133* 131* 130* 128*  K 3.7 3.7 3.6 3.4* 3.6  CL 95* 94* 94* 93* 93*  CO2 24 25 25 24 22   GLUCOSE 145* 181* 161* 143* 158*  BUN 58* 50* 57* 66* 84*  CREATININE 4.40* 3.97* 3.97* 4.30* 4.74*  CALCIUM 8.8* 8.5* 8.5* 8.3* 8.1*  MG  --  1.8  --   --  1.9  PHOS 1.8*  --  1.7* 3.1 2.8    ABG: Recent Labs  Lab 12/28/19 1445 12/28/19 2000  PHART 7.297* 7.441  PCO2ART 51.8* 38.7  PO2ART 109* 154*  HCO3 24.5 26.3  O2SAT 97.9 99.6    Liver Function Tests: Recent Labs  Lab 12/27/19 0632 12/29/19 0612 12/30/19 0534 12/31/19 1444  ALBUMIN 1.8* 1.6* 1.5* 1.4*   No results for input(s): LIPASE, AMYLASE in the last 168 hours. No results for input(s): AMMONIA in the last 168 hours.  CBC: Recent Labs  Lab  12/27/19 0632 12/28/19 1430 12/29/19 0612 12/31/19 1444 12/31/19 1837  WBC 23.0* 21.4* 17.7* 14.6* 14.6*  HGB 9.8* 10.0* 10.0* 7.6* 7.7*  HCT 31.9* 31.6* 31.9* 23.9* 24.0*  MCV 95.2 94.3 92.5 92.3 91.3  PLT 177 122* 124* 95* 78*    Cardiac Enzymes: Recent Labs  Lab 12/28/19 1539  CKTOTAL 25*  CKMB 4.2    BNP (last 3 results) No results for input(s): BNP in the last 8760 hours.  ProBNP (last 3 results) No results for input(s): PROBNP in the last 8760 hours.  Radiological Exams: No results found.  Assessment/Plan Active Problems:   Acute on chronic respiratory failure with hypoxia (HCC)   Chronic systolic (congestive) heart failure (HCC)   End stage renal disease on dialysis [facility])   Chronic atrial fibrillation (HCC)   Bilateral pleural effusion   1. Acute on chronic respiratory failure with hypoxia 2. At this time remains on full support.  Continue supportive measures 3. Chronic systolic heart failure right now is compensated we will continue present management 4. End-stage renal disease on dialysis 5. Chronic atrial fibrillation rate controlled 6. Bilateral pleural effusionwith pulmonary edema post code.   I have personally seen and evaluated the patient, evaluated laboratory and imaging results, formulated the assessment and plan and placed orders. The Patient requires  high complexity decision making with multiple systems involvement.  Rounds were done with the Respiratory Therapy Director and Staff therapists and discussed with nursing staff also.  Allyne Gee, MD [facility] Pulmonary Critical Care Medicine Sleep Medicine

## 2020-01-02 NOTE — Progress Notes (Addendum)
Pulmonary Critical Care Medicine [place]   PULMONARY CRITICAL CARE SERVICE  PROGRESS NOTE  Date of Service: 01/02/2020  Jose Tucker  POE:[id number]  DOB: [birth year]   DOA: 12/18/2019  Referring Physician: Merton Border, MD  HPI: Jose Tucker is a 69 y.o. male seen for follow up of Acute on Chronic Respiratory Failure.  Patient remains on full support at this time on the ventilator currently rate 25 and FiO2 55%  Medications: Reviewed on Rounds  Physical Exam:  Vitals: Pulse 101 respirations 21 BP 94/50 O2 sat 100% temp 96.8  Ventilator Settings ventilator mode AC VC rate of 25 tidal volume 500 PEEP of 5 and FiO2 55%  . General: Comfortable at this time . Eyes: Grossly normal lids, irises & conjunctiva . ENT: grossly tongue is normal . Neck: no obvious mass . Cardiovascular: S1 S2 normal no gallop . Respiratory: Coarse breath sounds . Abdomen: soft . Skin: no rash seen on limited exam . Musculoskeletal: not rigid . Psychiatric:unable to assess . Neurologic: no seizure no involuntary movements         Lab Data:   Basic Metabolic Panel: Recent Labs  Lab 12/27/19 0632 12/28/19 1539 12/29/19 0612 12/30/19 0534 12/31/19 1444  NA 132* 133* 131* 130* 128*  K 3.7 3.7 3.6 3.4* 3.6  CL 95* 94* 94* 93* 93*  CO2 24 25 25 24 22   GLUCOSE 145* 181* 161* 143* 158*  BUN 58* 50* 57* 66* 84*  CREATININE 4.40* 3.97* 3.97* 4.30* 4.74*  CALCIUM 8.8* 8.5* 8.5* 8.3* 8.1*  MG  --  1.8  --   --  1.9  PHOS 1.8*  --  1.7* 3.1 2.8    ABG: Recent Labs  Lab 12/28/19 1445 12/28/19 2000  PHART 7.297* 7.441  PCO2ART 51.8* 38.7  PO2ART 109* 154*  HCO3 24.5 26.3  O2SAT 97.9 99.6    Liver Function Tests: Recent Labs  Lab 12/27/19 0632 12/29/19 0612 12/30/19 0534 12/31/19 1444  ALBUMIN 1.8* 1.6* 1.5* 1.4*   No results for input(s): LIPASE, AMYLASE in the last 168 hours. No results for input(s): AMMONIA in the last 168 hours.  CBC: Recent Labs  Lab  12/27/19 0632 12/28/19 1430 12/29/19 0612 12/31/19 1444 12/31/19 1837  WBC 23.0* 21.4* 17.7* 14.6* 14.6*  HGB 9.8* 10.0* 10.0* 7.6* 7.7*  HCT 31.9* 31.6* 31.9* 23.9* 24.0*  MCV 95.2 94.3 92.5 92.3 91.3  PLT 177 122* 124* 95* 78*    Cardiac Enzymes: Recent Labs  Lab 12/28/19 1539  CKTOTAL 25*  CKMB 4.2    BNP (last 3 results) No results for input(s): BNP in the last 8760 hours.  ProBNP (last 3 results) No results for input(s): PROBNP in the last 8760 hours.  Radiological Exams: No results found.  Assessment/Plan Active Problems:   Acute on chronic respiratory failure with hypoxia (HCC)   Chronic systolic (congestive) heart failure (HCC)   End stage renal disease on dialysis [facility])   Chronic atrial fibrillation (HCC)   Bilateral pleural effusion   1. Acute on chronic respiratory failure with hypoxiacontinue full support ventilator at this time settings above. Continue aggressive pulmonary toilet and supportive measures. 2. Chronic systolic heart failure right now is compensated we will continue present management 3. End-stage renal disease on dialysis 4. Chronic atrial fibrillation rate controlled 5. Bilateral pleural effusionwith pulmonary edema post code.   I have personally seen and evaluated the patient, evaluated laboratory and imaging results, formulated the assessment and plan and placed orders. The Patient  requires high complexity decision making with multiple systems involvement.  Rounds were done with the Respiratory Therapy Director and Staff therapists and discussed with nursing staff also.  Allyne Gee, MD [facility] Pulmonary Critical Care Medicine Sleep Medicine

## 2020-01-03 DIAGNOSIS — J9621 Acute and chronic respiratory failure with hypoxia: Secondary | ICD-10-CM | POA: Diagnosis not present

## 2020-01-03 DIAGNOSIS — J90 Pleural effusion, not elsewhere classified: Secondary | ICD-10-CM | POA: Diagnosis not present

## 2020-01-03 DIAGNOSIS — I5022 Chronic systolic (congestive) heart failure: Secondary | ICD-10-CM | POA: Diagnosis not present

## 2020-01-03 DIAGNOSIS — I482 Chronic atrial fibrillation, unspecified: Secondary | ICD-10-CM | POA: Diagnosis not present

## 2020-01-03 LAB — CBC
HCT: 23.7 % — ABNORMAL LOW (ref 39.0–52.0)
Hemoglobin: 7.5 g/dL — ABNORMAL LOW (ref 13.0–17.0)
MCH: 29.6 pg (ref 26.0–34.0)
MCHC: 31.6 g/dL (ref 30.0–36.0)
MCV: 93.7 fL (ref 80.0–100.0)
Platelets: 154 10*3/uL (ref 150–400)
RBC: 2.53 MIL/uL — ABNORMAL LOW (ref 4.22–5.81)
RDW: 17.4 % — ABNORMAL HIGH (ref 11.5–15.5)
WBC: 19.2 10*3/uL — ABNORMAL HIGH (ref 4.0–10.5)
nRBC: 0.1 % (ref 0.0–0.2)

## 2020-01-03 LAB — RENAL FUNCTION PANEL
Albumin: 1.6 g/dL — ABNORMAL LOW (ref 3.5–5.0)
Anion gap: 12 (ref 5–15)
BUN: 84 mg/dL — ABNORMAL HIGH (ref 8–23)
CO2: 22 mmol/L (ref 22–32)
Calcium: 8.5 mg/dL — ABNORMAL LOW (ref 8.9–10.3)
Chloride: 94 mmol/L — ABNORMAL LOW (ref 98–111)
Creatinine, Ser: 4.68 mg/dL — ABNORMAL HIGH (ref 0.61–1.24)
GFR calc Af Amer: 14 mL/min — ABNORMAL LOW (ref >60)
GFR calc non Af Amer: 12 mL/min — ABNORMAL LOW (ref >60)
Glucose, Bld: 190 mg/dL — ABNORMAL HIGH (ref 70–99)
Phosphorus: 1.8 mg/dL — ABNORMAL LOW (ref 2.5–4.6)
Potassium: 4 mmol/L (ref 3.5–5.1)
Sodium: 128 mmol/L — ABNORMAL LOW (ref 135–145)

## 2020-01-03 LAB — MAGNESIUM: Magnesium: 2.1 mg/dL (ref 1.7–2.4)

## 2020-01-03 NOTE — Progress Notes (Signed)
[facility]  ROUNDING NOTE   Subjective:  Patient seen and evaluated at bedside. Due for dialysis treatment today. Still on the ventilator.  Objective:  Vital signs in last 24 hours:  Temperature 96.7 pulse 74 respirations 31 blood pressure 134/58  Physical Exam: General: Critically ill-appearing  Head: Normocephalic, atraumatic. Moist oral mucosal membranes  Eyes: Anicteric  Neck: Tracheostomy in place  Lungs:  Scattered rhonchi bilateral, vent assisted  Heart: S1S2 irregular  Abdomen:  Soft, nontender, bowel sounds present  Extremities: Trace peripheral edema.  Neurologic: Awake, alert, following commands  Skin: No lesions  Access: Right IJ PermCath    Basic Metabolic Panel: Recent Labs  Lab 12/28/19 1539 12/28/19 1539 12/29/19 0612 12/29/19 0612 12/30/19 0534 12/31/19 1444 01/03/20 0654  NA 133*  --  131*  --  130* 128* 128*  K 3.7  --  3.6  --  3.4* 3.6 4.0  CL 94*  --  94*  --  93* 93* 94*  CO2 25  --  25  --  24 22 22   GLUCOSE 181*  --  161*  --  143* 158* 190*  BUN 50*  --  57*  --  66* 84* 84*  CREATININE 3.97*  --  3.97*  --  4.30* 4.74* 4.68*  CALCIUM 8.5*   < > 8.5*   < > 8.3* 8.1* 8.5*  MG 1.8  --   --   --   --  1.9 2.1  PHOS  --   --  1.7*  --  3.1 2.8 1.8*   < > = values in this interval not displayed.    Liver Function Tests: Recent Labs  Lab 12/29/19 0612 12/30/19 0534 12/31/19 1444 01/03/20 0654  ALBUMIN 1.6* 1.5* 1.4* 1.6*   No results for input(s): LIPASE, AMYLASE in the last 168 hours. No results for input(s): AMMONIA in the last 168 hours.  CBC: Recent Labs  Lab 12/28/19 1430 12/29/19 0612 12/31/19 1444 12/31/19 1837 01/03/20 0654  WBC 21.4* 17.7* 14.6* 14.6* 19.2*  HGB 10.0* 10.0* 7.6* 7.7* 7.5*  HCT 31.6* 31.9* 23.9* 24.0* 23.7*  MCV 94.3 92.5 92.3 91.3 93.7  PLT 122* 124* 95* 78* 154    Cardiac Enzymes: Recent Labs  Lab 12/28/19 1539  CKTOTAL 25*  CKMB 4.2    BNP: Invalid input(s):  POCBNP  CBG: No results for input(s): GLUCAP in the last 168 hours.  Microbiology: Results for orders placed or performed during the hospital encounter of 12/10/2019  C difficile quick scan w PCR reflex     Status: None   Collection Time: 12/18/19  4:49 AM   Specimen: STOOL  Result Value Ref Range Status   C Diff antigen NEGATIVE NEGATIVE Final   C Diff toxin NEGATIVE NEGATIVE Final   C Diff interpretation No C. difficile detected.  Corrected    Comment: Performed at [facility], [place] [address] [place], [place] [address] CORRECTED ON 03/13 AT 1510: PREVIOUSLY REPORTED AS VALID   Culture, respiratory (non-expectorated)     Status: None   Collection Time: 12/24/19  4:00 PM   Specimen: Tracheal Aspirate; Respiratory  Result Value Ref Range Status   Specimen Description TRACHEAL ASPIRATE  Final   Special Requests NONE  Final   Gram Stain   Final    ABUNDANT WBC PRESENT, PREDOMINANTLY PMN ABUNDANT GRAM POSITIVE RODS FEW GRAM NEGATIVE RODS Performed at [facility], [place] [address], [place], [place] [address]    Culture MODERATE  PROTEUS MIRABILIS  Final   Report Status 12/26/2019 FINAL  Final   Organism ID, Bacteria PROTEUS MIRABILIS  Final      Susceptibility   Proteus mirabilis - MIC*    AMPICILLIN <=2 SENSITIVE Sensitive     CEFAZOLIN 8 SENSITIVE Sensitive     CEFEPIME <=0.12 SENSITIVE Sensitive     CEFTAZIDIME <=1 SENSITIVE Sensitive     CEFTRIAXONE <=0.25 SENSITIVE Sensitive     CIPROFLOXACIN <=0.25 SENSITIVE Sensitive     GENTAMICIN <=1 SENSITIVE Sensitive     IMIPENEM 4 SENSITIVE Sensitive     TRIMETH/SULFA <=20 SENSITIVE Sensitive     AMPICILLIN/SULBACTAM <=2 SENSITIVE Sensitive     PIP/TAZO <=4 SENSITIVE Sensitive     * MODERATE PROTEUS MIRABILIS  Culture, blood (routine x 2)     Status: None   Collection Time: 12/25/19  5:16 PM   Specimen: BLOOD  Result Value Ref Range Status   Specimen Description BLOOD RIGHT ANTECUBITAL  Final   Special Requests    Final    BOTTLES DRAWN AEROBIC AND ANAEROBIC Blood Culture adequate volume   Culture   Final    NO GROWTH 5 DAYS Performed at [facility], [address] [place], [place] [address]    Report Status 12/30/2019 FINAL  Final  Culture, blood (routine x 2)     Status: None   Collection Time: 12/25/19  5:18 PM   Specimen: BLOOD  Result Value Ref Range Status   Specimen Description BLOOD RIGHT ANTECUBITAL  Final   Special Requests   Final    BOTTLES DRAWN AEROBIC AND ANAEROBIC Blood Culture adequate volume   Culture   Final    NO GROWTH 5 DAYS Performed at [facility], [place] [address] [place], [place] [address]    Report Status 12/30/2019 FINAL  Final  C difficile quick scan w PCR reflex     Status: Abnormal   Collection Time: 12/26/19 12:11 PM   Specimen: STOOL  Result Value Ref Range Status   C Diff antigen POSITIVE (A) NEGATIVE Final   C Diff toxin POSITIVE (A) NEGATIVE Final    Comment: CRITICAL RESULT CALLED TO, READ BACK BY AND VERIFIED WITH:  RN CHRISSY R. @1817  12/26/2019 AKT    C Diff interpretation Toxin producing C. difficile detected.  Final    Coagulation Studies: No results for input(s): LABPROT, INR in the last 72 hours.  Urinalysis: No results for input(s): COLORURINE, LABSPEC, PHURINE, GLUCOSEU, HGBUR, BILIRUBINUR, KETONESUR, PROTEINUR, UROBILINOGEN, NITRITE, LEUKOCYTESUR in the last 72 hours.  Invalid input(s): APPERANCEUR    Imaging: No results found.   Medications:     iohexol  Assessment/ Plan:  69 y.o. male with a PMHx of ESRD on HD, CHF, aortic stenosis, hypertension, diabetes mellitus type 2, hyperlipidemia, obstructive sleep apnea, chronic venous stasis with LE edema, and obesity who was admitted to [facility] on 12/29/2019 for ongoing treatment of respiratory failure, ESRD, malnutrition, and generalized debility.    1.  ESRD on HD MWF.    Patient due for hemodialysis treatment today.  Orders have been prepared.  Continue to monitor  renal parameters closely.  2.  Acute respiratory failure.    Patient remains on ventilatory support.  Vent weaning as per pulmonary/critical care.  3.  Secondary hyperparathyroidism.    Phosphorus low again at 1.8.  Administer sodium phosphorus 30 mmol IV x1 today.  4.  Anemia of chronic kidney disease.  Hemoglobin dropped significantly over the weekend and currently 7.5.  Further work-up as per primary team.   LOS: 0 Xaine Sansom 3/29/20214:48 PM

## 2020-01-03 NOTE — Progress Notes (Signed)
Pulmonary Critical Care Medicine [place]   PULMONARY CRITICAL CARE SERVICE  PROGRESS NOTE  Date of Service: 01/03/2020  Arianna Delsanto  ATF:[id number]  DOB: 1951-06-20   DOA: 12/17/2019  Referring Physician: Merton Border, MD  HPI: Trong Gosling is a 69 y.o. male seen for follow up of Acute on Chronic Respiratory Failure.  Patient at this time is on full support on assist control mode was started on Levophed because of increased issues with dropping blood pressure right now is on 30% FiO2  Medications: Reviewed on Rounds  Physical Exam:  Vitals: Temperature is 96.7 pulse 74 respiratory rate 31 blood pressure is 134/58 saturations 97%  Ventilator Settings on assist control FiO2 30% tidal volume 488 PEEP 5  . General: Comfortable at this time . Eyes: Grossly normal lids, irises & conjunctiva . ENT: grossly tongue is normal . Neck: no obvious mass . Cardiovascular: S1 S2 normal no gallop . Respiratory: No rhonchi no rales are noted at this time . Abdomen: soft . Skin: no rash seen on limited exam . Musculoskeletal: not rigid . Psychiatric:unable to assess . Neurologic: no seizure no involuntary movements         Lab Data:   Basic Metabolic Panel: Recent Labs  Lab 12/28/19 1539 12/29/19 0612 12/30/19 0534 12/31/19 1444 01/03/20 0654  NA 133* 131* 130* 128* 128*  K 3.7 3.6 3.4* 3.6 4.0  CL 94* 94* 93* 93* 94*  CO2 25 25 24 22 22   GLUCOSE 181* 161* 143* 158* 190*  BUN 50* 57* 66* 84* 84*  CREATININE 3.97* 3.97* 4.30* 4.74* 4.68*  CALCIUM 8.5* 8.5* 8.3* 8.1* 8.5*  MG 1.8  --   --  1.9 2.1  PHOS  --  1.7* 3.1 2.8 1.8*    ABG: Recent Labs  Lab 12/28/19 1445 12/28/19 2000  PHART 7.297* 7.441  PCO2ART 51.8* 38.7  PO2ART 109* 154*  HCO3 24.5 26.3  O2SAT 97.9 99.6    Liver Function Tests: Recent Labs  Lab 12/29/19 0612 12/30/19 0534 12/31/19 1444 01/03/20 0654  ALBUMIN 1.6* 1.5* 1.4* 1.6*   No results for input(s): LIPASE, AMYLASE in  the last 168 hours. No results for input(s): AMMONIA in the last 168 hours.  CBC: Recent Labs  Lab 12/28/19 1430 12/29/19 0612 12/31/19 1444 12/31/19 1837 01/03/20 0654  WBC 21.4* 17.7* 14.6* 14.6* 19.2*  HGB 10.0* 10.0* 7.6* 7.7* 7.5*  HCT 31.6* 31.9* 23.9* 24.0* 23.7*  MCV 94.3 92.5 92.3 91.3 93.7  PLT 122* 124* 95* 78* 154    Cardiac Enzymes: Recent Labs  Lab 12/28/19 1539  CKTOTAL 25*  CKMB 4.2    BNP (last 3 results) No results for input(s): BNP in the last 8760 hours.  ProBNP (last 3 results) No results for input(s): PROBNP in the last 8760 hours.  Radiological Exams: No results found.  Assessment/Plan Active Problems:   Acute on chronic respiratory failure with hypoxia (HCC)   Chronic systolic (congestive) heart failure (HCC)   End stage renal disease on dialysis [facility])   Chronic atrial fibrillation (HCC)   Bilateral pleural effusion   1. Acute on chronic respiratory failure with hypoxia plan is to continue with full support on assist control at this time since patient is on pressors will need to move slowly as far as weaning 2. Chronic systolic heart failure compensated we will continue present management 3. End-stage renal disease on hemodialysis 4. Chronic atrial fibrillation rate controlled 5. Bilateral effusions we are following radiologically   I  have personally seen and evaluated the patient, evaluated laboratory and imaging results, formulated the assessment and plan and placed orders. The Patient requires high complexity decision making with multiple systems involvement.  Rounds were done with the Respiratory Therapy Director and Staff therapists and discussed with nursing staff also.  Allyne Gee, MD [facility] Pulmonary Critical Care Medicine Sleep Medicine

## 2020-01-04 DIAGNOSIS — I482 Chronic atrial fibrillation, unspecified: Secondary | ICD-10-CM | POA: Diagnosis not present

## 2020-01-04 DIAGNOSIS — I5022 Chronic systolic (congestive) heart failure: Secondary | ICD-10-CM | POA: Diagnosis not present

## 2020-01-04 DIAGNOSIS — J90 Pleural effusion, not elsewhere classified: Secondary | ICD-10-CM | POA: Diagnosis not present

## 2020-01-04 DIAGNOSIS — J9621 Acute and chronic respiratory failure with hypoxia: Secondary | ICD-10-CM | POA: Diagnosis not present

## 2020-01-04 LAB — OCCULT BLOOD X 1 CARD TO LAB, STOOL: Fecal Occult Bld: POSITIVE — AB

## 2020-01-04 NOTE — Progress Notes (Signed)
Pulmonary Critical Care Medicine [place]   PULMONARY CRITICAL CARE SERVICE  PROGRESS NOTE  Date of Service: 01/04/2020  Jose Tucker  CHE:[id number]  DOB: [birth year]   DOA: 12/13/2019  Referring Physician: Merton Border, MD  HPI: Jose Tucker is a 69 y.o. male seen for follow up of Acute on Chronic Respiratory Failure. Patient is on assist control right now on full support on 45% FiO2 has not been tolerating weaning.  Patient is also on Levophed  Medications: Reviewed on Rounds  Physical Exam:  Vitals: Temperature is 97.6 pulse 106 respiratory rate 24 blood pressure is 126/59 saturations 98%  Ventilator Settings on assist control FiO2 45% PEEP 5 tidal line 560  . General: Comfortable at this time . Eyes: Grossly normal lids, irises & conjunctiva . ENT: grossly tongue is normal . Neck: no obvious mass . Cardiovascular: S1 S2 normal no gallop . Respiratory: No rhonchi no rales . Abdomen: soft . Skin: no rash seen on limited exam . Musculoskeletal: not rigid . Psychiatric:unable to assess . Neurologic: no seizure no involuntary movements         Lab Data:   Basic Metabolic Panel: Recent Labs  Lab 12/28/19 1539 12/29/19 0612 12/30/19 0534 12/31/19 1444 01/03/20 0654  NA 133* 131* 130* 128* 128*  K 3.7 3.6 3.4* 3.6 4.0  CL 94* 94* 93* 93* 94*  CO2 25 25 24 22 22   GLUCOSE 181* 161* 143* 158* 190*  BUN 50* 57* 66* 84* 84*  CREATININE 3.97* 3.97* 4.30* 4.74* 4.68*  CALCIUM 8.5* 8.5* 8.3* 8.1* 8.5*  MG 1.8  --   --  1.9 2.1  PHOS  --  1.7* 3.1 2.8 1.8*    ABG: Recent Labs  Lab 12/28/19 1445 12/28/19 2000  PHART 7.297* 7.441  PCO2ART 51.8* 38.7  PO2ART 109* 154*  HCO3 24.5 26.3  O2SAT 97.9 99.6    Liver Function Tests: Recent Labs  Lab 12/29/19 0612 12/30/19 0534 12/31/19 1444 01/03/20 0654  ALBUMIN 1.6* 1.5* 1.4* 1.6*   No results for input(s): LIPASE, AMYLASE in the last 168 hours. No results for input(s): AMMONIA in the last  168 hours.  CBC: Recent Labs  Lab 12/29/19 0612 12/31/19 1444 12/31/19 1837 01/03/20 0654  WBC 17.7* 14.6* 14.6* 19.2*  HGB 10.0* 7.6* 7.7* 7.5*  HCT 31.9* 23.9* 24.0* 23.7*  MCV 92.5 92.3 91.3 93.7  PLT 124* 95* 78* 154    Cardiac Enzymes: Recent Labs  Lab 12/28/19 1539  CKTOTAL 25*  CKMB 4.2    BNP (last 3 results) No results for input(s): BNP in the last 8760 hours.  ProBNP (last 3 results) No results for input(s): PROBNP in the last 8760 hours.  Radiological Exams: No results found.  Assessment/Plan Active Problems:   Acute on chronic respiratory failure with hypoxia (HCC)   Chronic systolic (congestive) heart failure (HCC)   End stage renal disease on dialysis [facility])   Chronic atrial fibrillation (HCC)   Bilateral pleural effusion   1. Acute on chronic respiratory failure with hypoxia we will continue with full support on assist control mode patient has not been tolerating weaning attempts patient is also on Levophed so we will hold off on weaning 2. Chronic systolic heart failure patient has been on dialysis which will be continued as tolerated monitor fluid status 3. End-stage renal disease being followed by nephrology 4. Chronic atrial fibrillation rate controlled 5. Bilateral effusions again fluid management   I have personally seen and evaluated the patient, evaluated  laboratory and imaging results, formulated the assessment and plan and placed orders. The Patient requires high complexity decision making with multiple systems involvement.  Rounds were done with the Respiratory Therapy Director and Staff therapists and discussed with nursing staff also.  Allyne Gee, MD [facility] Pulmonary Critical Care Medicine Sleep Medicine

## 2020-01-05 DIAGNOSIS — J9621 Acute and chronic respiratory failure with hypoxia: Secondary | ICD-10-CM | POA: Diagnosis not present

## 2020-01-05 DIAGNOSIS — I5022 Chronic systolic (congestive) heart failure: Secondary | ICD-10-CM | POA: Diagnosis not present

## 2020-01-05 DIAGNOSIS — J90 Pleural effusion, not elsewhere classified: Secondary | ICD-10-CM | POA: Diagnosis not present

## 2020-01-05 DIAGNOSIS — I482 Chronic atrial fibrillation, unspecified: Secondary | ICD-10-CM | POA: Diagnosis not present

## 2020-01-05 LAB — CBC
HCT: 19.6 % — ABNORMAL LOW (ref 39.0–52.0)
HCT: 21.5 % — ABNORMAL LOW (ref 39.0–52.0)
HCT: 24.9 % — ABNORMAL LOW (ref 39.0–52.0)
Hemoglobin: 6.2 g/dL — CL (ref 13.0–17.0)
Hemoglobin: 6.9 g/dL — CL (ref 13.0–17.0)
Hemoglobin: 7.9 g/dL — ABNORMAL LOW (ref 13.0–17.0)
MCH: 30 pg (ref 26.0–34.0)
MCH: 29.7 pg (ref 26.0–34.0)
MCH: 28.9 pg (ref 26.0–34.0)
MCHC: 31.6 g/dL (ref 30.0–36.0)
MCHC: 32.1 g/dL (ref 30.0–36.0)
MCHC: 31.7 g/dL (ref 30.0–36.0)
MCV: 94.7 fL (ref 80.0–100.0)
MCV: 92.7 fL (ref 80.0–100.0)
MCV: 91.2 fL (ref 80.0–100.0)
Platelets: 302 10*3/uL (ref 150–400)
Platelets: 328 10*3/uL (ref 150–400)
Platelets: 289 10*3/uL (ref 150–400)
RBC: 2.07 MIL/uL — ABNORMAL LOW (ref 4.22–5.81)
RBC: 2.32 MIL/uL — ABNORMAL LOW (ref 4.22–5.81)
RBC: 2.73 MIL/uL — ABNORMAL LOW (ref 4.22–5.81)
RDW: 18.6 % — ABNORMAL HIGH (ref 11.5–15.5)
RDW: 18.8 % — ABNORMAL HIGH (ref 11.5–15.5)
RDW: 18.5 % — ABNORMAL HIGH (ref 11.5–15.5)
WBC: 25.2 10*3/uL — ABNORMAL HIGH (ref 4.0–10.5)
WBC: 28.4 10*3/uL — ABNORMAL HIGH (ref 4.0–10.5)
WBC: 29.8 10*3/uL — ABNORMAL HIGH (ref 4.0–10.5)
nRBC: 0.5 % — ABNORMAL HIGH (ref 0.0–0.2)
nRBC: 1.3 % — ABNORMAL HIGH (ref 0.0–0.2)
nRBC: 1.9 % — ABNORMAL HIGH (ref 0.0–0.2)

## 2020-01-05 LAB — ABO/RH: ABO/RH(D): A POS

## 2020-01-05 LAB — RENAL FUNCTION PANEL
Albumin: 2 g/dL — ABNORMAL LOW (ref 3.5–5.0)
Anion gap: 13 (ref 5–15)
BUN: 86 mg/dL — ABNORMAL HIGH (ref 8–23)
CO2: 24 mmol/L (ref 22–32)
Calcium: 8.8 mg/dL — ABNORMAL LOW (ref 8.9–10.3)
Chloride: 92 mmol/L — ABNORMAL LOW (ref 98–111)
Creatinine, Ser: 4.27 mg/dL — ABNORMAL HIGH (ref 0.61–1.24)
GFR calc Af Amer: 15 mL/min — ABNORMAL LOW (ref >60)
GFR calc non Af Amer: 13 mL/min — ABNORMAL LOW (ref >60)
Glucose, Bld: 268 mg/dL — ABNORMAL HIGH (ref 70–99)
Phosphorus: 2.4 mg/dL — ABNORMAL LOW (ref 2.5–4.6)
Potassium: 4.5 mmol/L (ref 3.5–5.1)
Sodium: 129 mmol/L — ABNORMAL LOW (ref 135–145)

## 2020-01-05 LAB — PREPARE RBC (CROSSMATCH)

## 2020-01-05 LAB — MAGNESIUM: Magnesium: 2.2 mg/dL (ref 1.7–2.4)

## 2020-01-05 NOTE — Progress Notes (Signed)
[facility]  ROUNDING NOTE   Subjective:  Patient seen and evaluated during dialysis treatment. Currently on norepinephrine to maintain blood pressure. Still on the ventilator. Hemoglobin also down to 6.4 today. Case discussed with hospitalist.  Objective:  Vital signs in last 24 hours:  Temperature 96.9 pulse 91 respirations 18 blood pressure 127/65  Physical Exam: General: Critically ill-appearing  Head: Normocephalic, atraumatic. Moist oral mucosal membranes  Eyes: Anicteric  Neck: Tracheostomy in place  Lungs:  Scattered rhonchi bilateral, vent assisted  Heart: S1S2 irregular  Abdomen:  Soft, nontender, bowel sounds present  Extremities:  1+ peripheral edema.  Neurologic: Awake, alert, following commands  Skin: Bilateral upper extremity ecchymoses  Access: Right IJ PermCath    Basic Metabolic Panel: Recent Labs  Lab 12/30/19 0534 12/30/19 0534 12/31/19 1444 01/03/20 0654 01/05/20 0438  NA 130*  --  128* 128* 129*  K 3.4*  --  3.6 4.0 4.5  CL 93*  --  93* 94* 92*  CO2 24  --  22 22 24   GLUCOSE 143*  --  158* 190* 268*  BUN 66*  --  84* 84* 86*  CREATININE 4.30*  --  4.74* 4.68* 4.27*  CALCIUM 8.3*   < > 8.1* 8.5* 8.8*  MG  --   --  1.9 2.1 2.2  PHOS 3.1  --  2.8 1.8* 2.4*   < > = values in this interval not displayed.    Liver Function Tests: Recent Labs  Lab 12/30/19 0534 12/31/19 1444 01/03/20 0654 01/05/20 0438  ALBUMIN 1.5* 1.4* 1.6* 2.0*   No results for input(s): LIPASE, AMYLASE in the last 168 hours. No results for input(s): AMMONIA in the last 168 hours.  CBC: Recent Labs  Lab 12/31/19 1444 12/31/19 1837 01/03/20 0654 01/05/20 0438  WBC 14.6* 14.6* 19.2* 25.2*  HGB 7.6* 7.7* 7.5* 6.2*  HCT 23.9* 24.0* 23.7* 19.6*  MCV 92.3 91.3 93.7 94.7  PLT 95* 78* 154 302    Cardiac Enzymes: No results for input(s): CKTOTAL, CKMB, CKMBINDEX, TROPONINI in the last 168 hours.  BNP: Invalid input(s): POCBNP  CBG: No results for  input(s): GLUCAP in the last 168 hours.  Microbiology: Results for orders placed or performed during the hospital encounter of 12/19/2019  C difficile quick scan w PCR reflex     Status: None   Collection Time: 12/18/19  4:49 AM   Specimen: STOOL  Result Value Ref Range Status   C Diff antigen NEGATIVE NEGATIVE Final   C Diff toxin NEGATIVE NEGATIVE Final   C Diff interpretation No C. difficile detected.  Corrected    Comment: Performed at [facility], [place] [address], [place], [place] [address] CORRECTED ON 03/13 AT 1510: PREVIOUSLY REPORTED AS VALID   Culture, respiratory (non-expectorated)     Status: None   Collection Time: 12/24/19  4:00 PM   Specimen: Tracheal Aspirate; Respiratory  Result Value Ref Range Status   Specimen Description TRACHEAL ASPIRATE  Final   Special Requests NONE  Final   Gram Stain   Final    ABUNDANT WBC PRESENT, PREDOMINANTLY PMN ABUNDANT GRAM POSITIVE RODS FEW GRAM NEGATIVE RODS Performed at [facility], [place] [address] [place], [place] [address]    Culture MODERATE PROTEUS MIRABILIS  Final   Report Status 12/26/2019 FINAL  Final   Organism ID, Bacteria PROTEUS MIRABILIS  Final      Susceptibility   Proteus mirabilis - MIC*    AMPICILLIN <=2 SENSITIVE Sensitive  CEFAZOLIN 8 SENSITIVE Sensitive     CEFEPIME <=0.12 SENSITIVE Sensitive     CEFTAZIDIME <=1 SENSITIVE Sensitive     CEFTRIAXONE <=0.25 SENSITIVE Sensitive     CIPROFLOXACIN <=0.25 SENSITIVE Sensitive     GENTAMICIN <=1 SENSITIVE Sensitive     IMIPENEM 4 SENSITIVE Sensitive     TRIMETH/SULFA <=20 SENSITIVE Sensitive     AMPICILLIN/SULBACTAM <=2 SENSITIVE Sensitive     PIP/TAZO <=4 SENSITIVE Sensitive     * MODERATE PROTEUS MIRABILIS  Culture, blood (routine x 2)     Status: None   Collection Time: 12/25/19  5:16 PM   Specimen: BLOOD  Result Value Ref Range Status   Specimen Description BLOOD RIGHT ANTECUBITAL  Final   Special Requests   Final    BOTTLES DRAWN AEROBIC  AND ANAEROBIC Blood Culture adequate volume   Culture   Final    NO GROWTH 5 DAYS Performed at [facility], [place] [address], [place], [place] [address]    Report Status 12/30/2019 FINAL  Final  Culture, blood (routine x 2)     Status: None   Collection Time: 12/25/19  5:18 PM   Specimen: BLOOD  Result Value Ref Range Status   Specimen Description BLOOD RIGHT ANTECUBITAL  Final   Special Requests   Final    BOTTLES DRAWN AEROBIC AND ANAEROBIC Blood Culture adequate volume   Culture   Final    NO GROWTH 5 DAYS Performed at [facility], [place] [address], [place], [place] [address]    Report Status 12/30/2019 FINAL  Final  C difficile quick scan w PCR reflex     Status: Abnormal   Collection Time: 12/26/19 12:11 PM   Specimen: STOOL  Result Value Ref Range Status   C Diff antigen POSITIVE (A) NEGATIVE Final   C Diff toxin POSITIVE (A) NEGATIVE Final    Comment: CRITICAL RESULT CALLED TO, READ BACK BY AND VERIFIED WITH:  RN CHRISSY R. @1817  12/26/2019 AKT    C Diff interpretation Toxin producing C. difficile detected.  Final    Coagulation Studies: No results for input(s): LABPROT, INR in the last 72 hours.  Urinalysis: No results for input(s): COLORURINE, LABSPEC, PHURINE, GLUCOSEU, HGBUR, BILIRUBINUR, KETONESUR, PROTEINUR, UROBILINOGEN, NITRITE, LEUKOCYTESUR in the last 72 hours.  Invalid input(s): APPERANCEUR    Imaging: No results found.   Medications:     iohexol  Assessment/ Plan:  69 y.o. male with a PMHx of ESRD on HD, CHF, aortic stenosis, hypertension, diabetes mellitus type 2, hyperlipidemia, obstructive sleep apnea, chronic venous stasis with LE edema, and obesity who was admitted to [facility] on 12/23/2019 for ongoing treatment of respiratory failure, ESRD, malnutrition, and generalized debility.    1.  ESRD on HD MWF.    Patient seen and evaluated during hemodialysis.  Hypotension persist.  Patient received albumin.  Continue norepinephrine during  dialysis treatment today as well.  Ultrafiltration target 2 kg.  2.  Acute respiratory failure.    Maintain the patient on vent support at this time.  Has been difficult to wean.  3.  Secondary hyperparathyroidism.    Phosphorus up to 2.4 today.  Continue to monitor.  4.  Anemia of chronic kidney disease.  Hemoglobin down to 6.2 today.  Discussed with hospitalist.  GI to be consulted.  Agree with blood transfusion during dialysis treatment.  5.  Patient continues to have very guarded prognosis long-term.   LOS: 0 Nahima Ales 3/31/20217:57 AM

## 2020-01-05 NOTE — Progress Notes (Signed)
Pulmonary [place]   PULMONARY CRITICAL CARE SERVICE  PROGRESS NOTE  Date of Service: 01/05/2020  Jose Tucker  VPX:[id number]  DOB: Oct 10, 1950   DOA: 12/25/2019  Referring Physician: Merton Border, MD  HPI: Jose Tucker is a 69 y.o. male seen for follow up of Acute on Chronic Respiratory Failure.  Patient is on the ventilator on full support still requiring Levophed because of blood pressure issues.  Patient has been receiving hemodialysis and followed by nephrology  Medications: Reviewed on Rounds  Physical Exam:  Vitals: Temperature is 96.9 pulse 99 respiratory 18 blood pressure is 127/65 saturations 97%  Ventilator Settings mode of ventilation assist control FiO2 35% tidal volume 500 PEEP of 5  . General: Comfortable at this time . Eyes: Grossly normal lids, irises & conjunctiva . ENT: grossly tongue is normal . Neck: no obvious mass . Cardiovascular: S1 S2 normal no gallop . Respiratory: Coarse breath sounds are noted bilaterally . Abdomen: soft . Skin: no rash seen on limited exam . Musculoskeletal: not rigid . Psychiatric:unable to assess . Neurologic: no seizure no involuntary movements         Lab Data:   Basic Metabolic Panel: Recent Labs  Lab 12/30/19 0534 12/31/19 1444 01/03/20 0654 01/05/20 0438  NA 130* 128* 128* 129*  K 3.4* 3.6 4.0 4.5  CL 93* 93* 94* 92*  CO2 24 22 22 24   GLUCOSE 143* 158* 190* 268*  BUN 66* 84* 84* 86*  CREATININE 4.30* 4.74* 4.68* 4.27*  CALCIUM 8.3* 8.1* 8.5* 8.8*  MG  --  1.9 2.1 2.2  PHOS 3.1 2.8 1.8* 2.4*    ABG: No results for input(s): PHART, PCO2ART, PO2ART, HCO3, O2SAT in the last 168 hours.  Liver Function Tests: Recent Labs  Lab 12/30/19 0534 12/31/19 1444 01/03/20 0654 01/05/20 0438  ALBUMIN 1.5* 1.4* 1.6* 2.0*   No results for input(s): LIPASE, AMYLASE in the last 168 hours. No results for input(s): AMMONIA in the last 168 hours.  CBC: Recent Labs  Lab  12/31/19 1444 12/31/19 1837 01/03/20 0654 01/05/20 0438 01/05/20 1503  WBC 14.6* 14.6* 19.2* 25.2* 28.4*  HGB 7.6* 7.7* 7.5* 6.2* 6.9*  HCT 23.9* 24.0* 23.7* 19.6* 21.5*  MCV 92.3 91.3 93.7 94.7 92.7  PLT 95* 78* 154 302 328    Cardiac Enzymes: No results for input(s): CKTOTAL, CKMB, CKMBINDEX, TROPONINI in the last 168 hours.  BNP (last 3 results) No results for input(s): BNP in the last 8760 hours.  ProBNP (last 3 results) No results for input(s): PROBNP in the last 8760 hours.  Radiological Exams: No results found.  Assessment/Plan Active Problems:   Acute on chronic respiratory failure with hypoxia (HCC)   Chronic systolic (congestive) heart failure (HCC)   End stage renal disease on dialysis [facility])   Chronic atrial fibrillation (HCC)   Bilateral pleural effusion   1. Acute on chronic respiratory failure with hypoxia we will continue with full support on the ventilator patient has been on the Levophed and therefore not able to do any weaning at this stage 2. Chronic systolic heart failure watching fluid status closely patient is being dialyzed for fluid removal however also has been noted to have low hemoglobin 3. End-stage renal failure on hemodialysis we will continue to follow 4. Atrial fibrillation rate right now is controlled 5. Bilateral effusions we will follow   I have personally seen and evaluated the patient, evaluated laboratory and imaging results, formulated the assessment and plan and placed  orders. The Patient requires high complexity decision making with multiple systems involvement.  Rounds were done with the Respiratory Therapy Director and Staff therapists and discussed with nursing staff also.  Allyne Gee, MD [facility] Pulmonary Critical Care Medicine Sleep Medicine

## 2020-01-06 ENCOUNTER — Encounter: Payer: Self-pay | Admitting: Internal Medicine

## 2020-01-06 DIAGNOSIS — J9621 Acute and chronic respiratory failure with hypoxia: Secondary | ICD-10-CM | POA: Diagnosis not present

## 2020-01-06 DIAGNOSIS — R195 Other fecal abnormalities: Secondary | ICD-10-CM | POA: Insufficient documentation

## 2020-01-06 DIAGNOSIS — D649 Anemia, unspecified: Secondary | ICD-10-CM

## 2020-01-06 DIAGNOSIS — R710 Precipitous drop in hematocrit: Secondary | ICD-10-CM | POA: Insufficient documentation

## 2020-01-06 DIAGNOSIS — I482 Chronic atrial fibrillation, unspecified: Secondary | ICD-10-CM | POA: Diagnosis not present

## 2020-01-06 DIAGNOSIS — I5022 Chronic systolic (congestive) heart failure: Secondary | ICD-10-CM | POA: Diagnosis not present

## 2020-01-06 DIAGNOSIS — J90 Pleural effusion, not elsewhere classified: Secondary | ICD-10-CM | POA: Diagnosis not present

## 2020-01-06 LAB — CBC
HCT: 24 % — ABNORMAL LOW (ref 39.0–52.0)
HCT: 22.6 % — ABNORMAL LOW (ref 39.0–52.0)
Hemoglobin: 7.5 g/dL — ABNORMAL LOW (ref 13.0–17.0)
Hemoglobin: 7.1 g/dL — ABNORMAL LOW (ref 13.0–17.0)
MCH: 29.3 pg (ref 26.0–34.0)
MCH: 29.8 pg (ref 26.0–34.0)
MCHC: 31.3 g/dL (ref 30.0–36.0)
MCHC: 31.4 g/dL (ref 30.0–36.0)
MCV: 93.8 fL (ref 80.0–100.0)
MCV: 95 fL (ref 80.0–100.0)
Platelets: 306 10*3/uL (ref 150–400)
Platelets: 319 10*3/uL (ref 150–400)
RBC: 2.56 MIL/uL — ABNORMAL LOW (ref 4.22–5.81)
RBC: 2.38 MIL/uL — ABNORMAL LOW (ref 4.22–5.81)
RDW: 19 % — ABNORMAL HIGH (ref 11.5–15.5)
RDW: 19.6 % — ABNORMAL HIGH (ref 11.5–15.5)
WBC: 25.2 10*3/uL — ABNORMAL HIGH (ref 4.0–10.5)
WBC: 24.6 10*3/uL — ABNORMAL HIGH (ref 4.0–10.5)
nRBC: 3.7 % — ABNORMAL HIGH (ref 0.0–0.2)
nRBC: 3.9 % — ABNORMAL HIGH (ref 0.0–0.2)

## 2020-01-06 LAB — PROTIME-INR
INR: 1.4 — ABNORMAL HIGH (ref 0.8–1.2)
Prothrombin Time: 16.9 seconds — ABNORMAL HIGH (ref 11.4–15.2)

## 2020-01-06 LAB — PATHOLOGIST SMEAR REVIEW

## 2020-01-06 LAB — PREPARE RBC (CROSSMATCH)

## 2020-01-06 LAB — PROCALCITONIN: Procalcitonin: 8.2 ng/mL

## 2020-01-06 NOTE — Progress Notes (Signed)
Pulmonary Critical Care Medicine [place]   PULMONARY CRITICAL CARE SERVICE  PROGRESS NOTE  Date of Service: 01/06/2020  Jose Tucker  IEP:[id number]  DOB: 1950-10-24   DOA: 12/14/2019  Referring Physician: Merton Border, MD  HPI: Jose Tucker is a 69 y.o. male seen for follow up of Acute on Chronic Respiratory Failure.  Patient currently is on assist control mode started on Levophed again remains on the Levophed because of low blood pressure  Medications: Reviewed on Rounds  Physical Exam:  Vitals: Temperature is 97.6 pulse 113 respiratory 29 blood pressure is 135/75 saturations 94%  Ventilator Settings mode ventilation assist control FiO2 is 50% tidal volume 413 PEEP 5  . General: Comfortable at this time . Eyes: Grossly normal lids, irises & conjunctiva . ENT: grossly tongue is normal . Neck: no obvious mass . Cardiovascular: S1 S2 normal no gallop . Respiratory: Coarse breath sounds with a few scattered rhonchi . Abdomen: soft . Skin: no rash seen on limited exam . Musculoskeletal: not rigid . Psychiatric:unable to assess . Neurologic: no seizure no involuntary movements         Lab Data:   Basic Metabolic Panel: Recent Labs  Lab 12/31/19 1444 01/03/20 0654 01/05/20 0438  NA 128* 128* 129*  K 3.6 4.0 4.5  CL 93* 94* 92*  CO2 22 22 24   GLUCOSE 158* 190* 268*  BUN 84* 84* 86*  CREATININE 4.74* 4.68* 4.27*  CALCIUM 8.1* 8.5* 8.8*  MG 1.9 2.1 2.2  PHOS 2.8 1.8* 2.4*    ABG: No results for input(s): PHART, PCO2ART, PO2ART, HCO3, O2SAT in the last 168 hours.  Liver Function Tests: Recent Labs  Lab 12/31/19 1444 01/03/20 0654 01/05/20 0438  ALBUMIN 1.4* 1.6* 2.0*   No results for input(s): LIPASE, AMYLASE in the last 168 hours. No results for input(s): AMMONIA in the last 168 hours.  CBC: Recent Labs  Lab 01/03/20 0654 01/05/20 0438 01/05/20 1503 01/05/20 2045 01/06/20 0537  WBC 19.2* 25.2* 28.4* 29.8* 25.2*  HGB 7.5* 6.2*  6.9* 7.9* 7.5*  HCT 23.7* 19.6* 21.5* 24.9* 24.0*  MCV 93.7 94.7 92.7 91.2 93.8  PLT 154 302 328 289 306    Cardiac Enzymes: No results for input(s): CKTOTAL, CKMB, CKMBINDEX, TROPONINI in the last 168 hours.  BNP (last 3 results) No results for input(s): BNP in the last 8760 hours.  ProBNP (last 3 results) No results for input(s): PROBNP in the last 8760 hours.  Radiological Exams: No results found.  Assessment/Plan Active Problems:   Acute on chronic respiratory failure with hypoxia (HCC)   Chronic systolic (congestive) heart failure (HCC)   End stage renal disease on dialysis [facility])   Chronic atrial fibrillation (HCC)   Bilateral pleural effusion   1. Acute on chronic respiratory failure hypoxia plan is to continue with full support patient still requiring pressors.  The patient was seen by GI for possibility of GI bleeding recommended supportive care 2. Chronic systolic heart failure right now appears to be compensated we will continue to monitor along. 3. Chronic atrial fibrillation rate is controlled 4. End-stage renal disease on dialysis followed by nephrology 5. Bilateral pleural effusion following up radiologically.   I have personally seen and evaluated the patient, evaluated laboratory and imaging results, formulated the assessment and plan and placed orders. The Patient requires high complexity decision making with multiple systems involvement.  Rounds were done with the Respiratory Therapy Director and Staff therapists and discussed with nursing staff also.  Jose Tucker A  Humphrey Rolls, MD [facility] Pulmonary Critical Care Medicine Sleep Medicine

## 2020-01-06 NOTE — Progress Notes (Addendum)
[facility] Consult: 2:50 PM 01/06/2020  LOS: 0 days    Referring Provider: Dr Laren Everts   Primary Gastroenterologist: Althia Forts [facility] per Dr Royanne Foots at [facility].      Reason for Consultation:  FOBT + anemia.      HPI: Jose Tucker is a 69 y.o. male.  Resident of [facility].  PMH morbid obesity.  Atrial fibrillation, on Eliquis until recent acute admission..  ESRD on HD, requires midodrine on days of dialysis..  Diabetes.  Respiratory failure.  OSA.  Diabetes.  Sacral decubitus ulcer, chronic lower extremity venous stasis.  CVA.  Peripheral vascular disease, ischemic/necrotic toe ulcers angiography 11/05/2019 w significant disease at high risk for bilateral foot amputations.  Acute admission to [facility] rex12/31 - 12/23/2019 (this does not count days spent at the hospital in [facility] prior to [facility]) with rhabdomyolysis after laying on floor for 48 hours post falling out of bed.  Cardiac arrest echocardiogram with LVEF 35%, severe aortic stenosis.  Underwent cardiac cath and aortic balloon valvuloplasty. "Significant anemia" requiring multiple PRBCs.  12/05/2019 EGD w a few, nonbleeding, linear erosions in the lower esophagus.  Stomach duodenal bulb and D2 normal.  Eliquis was discontinued.  Prolonged vent support, failed vent wean requiring tracheostomy.  Grew Serratia from left thigh hematoma/cellulitis, required I&D of the thigh.  Treated for C. Difficile.  PEG placed 12/06/2019.  Family discussion with palliative care at which point patient was made DNR.  [facility] for [facility] weaning, management.  Having declining Hgb here - reported hematochezia yesterday.  He is on active treatment for C. difficile colitis.  He is requiring  Levophed to maintain his blood pressures.  Was on CRRT and is on a ventilator. Per [facility] staff he is not DNR now     Past Medical History:  Diagnosis Date  . Acute on chronic respiratory failure with hypoxia ([place])   . Aortic stenosis   . Bilateral pleural effusion   . C. difficile colitis   . Chronic atrial fibrillation ([place])   . Chronic systolic (congestive) heart failure ([place])   . End stage renal disease on dialysis ([place])   . Type 2 diabetes mellitus ([place])    Past Surgical History:  Procedure Laterality Date  . BALLOON AORTIC VALVE VALVULOPLASTY    . ESOPHAGOGASTRODUODENOSCOPY  11/2019  . PEG PLACEMENT          Allergies as of 12/16/2019  . (Not on File)   Medications include albumin, heparin with dialysis, Levophed, Solu-Medrol, metronidazole IV, amiodarone, 81 mg aspirin, atorvastatin, docusate, vancomycin, fish oil, Lantus, levothyroxine, melatonin, metoprolol, modafinil, Protonix IV, sertraline, thiamine and vitamin B12.  Clonazepam as needed.  Ondansetron and morphine as needed as well.     REVIEW OF SYSTEMS: As above  PHYSICAL EXAM: Critically ill morbidly obese on the ventilator with tube feeds going Abdomen is obese soft and nontender there is a gastrostomy tube in the midline Rectal shows brown stool  LAB RESULTS: Recent Labs    01/05/20 1503  01/05/20 2045 01/06/20 0537  WBC 28.4* 29.8* 25.2*  HGB 6.9* 7.9* 7.5*  HCT 21.5* 24.9* 24.0*  PLT 328 289 306   BMET Lab Results  Component Value Date   NA 129 (L) 01/05/2020   NA 128 (L) 01/03/2020   NA 128 (L) 12/31/2019   K 4.5 01/05/2020   K 4.0 01/03/2020   K 3.6 12/31/2019   CL 92 (L) 01/05/2020   CL 94 (L) 01/03/2020   CL 93 (L) 12/31/2019   CO2 24 01/05/2020   CO2 22 01/03/2020   CO2 22 12/31/2019   GLUCOSE 268 (H) 01/05/2020   GLUCOSE 190 (H) 01/03/2020   GLUCOSE 158 (H) 12/31/2019   BUN 86 (H) 01/05/2020   BUN 84 (H) 01/03/2020   BUN 84 (H) 12/31/2019   CREATININE 4.27 (H)  01/05/2020   CREATININE 4.68 (H) 01/03/2020   CREATININE 4.74 (H) 12/31/2019   CALCIUM 8.8 (L) 01/05/2020   CALCIUM 8.5 (L) 01/03/2020   CALCIUM 8.1 (L) 12/31/2019   LFT Recent Labs    01/05/20 0438  ALBUMIN 2.0*   PT/INR Lab Results  Component Value Date   INR 1.1 12/16/2019      IMPRESSION:   *    FOBT + anemia.   The anemia is multifactorial.  Reportedly he had hematochezia yesterday he has brown stool today.  EGD about a month ago did not show significant findings other than an erosion in the esophagus.  I think it is quite unlikely he is having a major GI bleed from the upper GI tract though it is possible.  I think it is more likely ischemia in his gut somewhere.  He has multiple other reasons to be anemic as well.  *   Acute on chronic respiratory failure, remains on vent.  *     Hypotension, requiring Levophed.  *     ESRD.  Requires hemodialysis.    PLAN:     I think supportive care is the best we can do at this point.  I am not inclined to do an endoscopy.  Should he need endoscopic evaluation he would need to be transferred to the ICU at [facility].  His prognosis as you know is poor but his family does not want him to be a DNR per my conversation with the [facility] staff. Check INR   I would eliminate heparin from dialysis, I would put him on a Protonix infusion, monitor his hemoglobin.  Should you show clear signs of hemorrhage from the GI tract I would reconsider endoscopy but do not recommend it at this time.   Silvano Rusk  01/06/2020, 2:50 PM Phone [phone]

## 2020-01-06 DEATH — deceased

## 2020-01-07 DIAGNOSIS — R710 Precipitous drop in hematocrit: Secondary | ICD-10-CM

## 2020-01-07 DIAGNOSIS — J9621 Acute and chronic respiratory failure with hypoxia: Secondary | ICD-10-CM | POA: Diagnosis not present

## 2020-01-07 DIAGNOSIS — I5022 Chronic systolic (congestive) heart failure: Secondary | ICD-10-CM | POA: Diagnosis not present

## 2020-01-07 DIAGNOSIS — I482 Chronic atrial fibrillation, unspecified: Secondary | ICD-10-CM | POA: Diagnosis not present

## 2020-01-07 DIAGNOSIS — J90 Pleural effusion, not elsewhere classified: Secondary | ICD-10-CM | POA: Diagnosis not present

## 2020-01-07 LAB — CBC
HCT: 22.1 % — ABNORMAL LOW (ref 39.0–52.0)
Hemoglobin: 6.8 g/dL — CL (ref 13.0–17.0)
MCH: 30 pg (ref 26.0–34.0)
MCHC: 30.8 g/dL (ref 30.0–36.0)
MCV: 97.4 fL (ref 80.0–100.0)
Platelets: 313 10*3/uL (ref 150–400)
RBC: 2.27 MIL/uL — ABNORMAL LOW (ref 4.22–5.81)
RDW: 20.9 % — ABNORMAL HIGH (ref 11.5–15.5)
WBC: 23.1 10*3/uL — ABNORMAL HIGH (ref 4.0–10.5)
nRBC: 3.1 % — ABNORMAL HIGH (ref 0.0–0.2)

## 2020-01-07 LAB — PREPARE RBC (CROSSMATCH)

## 2020-01-07 LAB — RENAL FUNCTION PANEL
Albumin: 2 g/dL — ABNORMAL LOW (ref 3.5–5.0)
Anion gap: 14 (ref 5–15)
BUN: 88 mg/dL — ABNORMAL HIGH (ref 8–23)
CO2: 23 mmol/L (ref 22–32)
Calcium: 8.4 mg/dL — ABNORMAL LOW (ref 8.9–10.3)
Chloride: 94 mmol/L — ABNORMAL LOW (ref 98–111)
Creatinine, Ser: 3.9 mg/dL — ABNORMAL HIGH (ref 0.61–1.24)
GFR calc Af Amer: 17 mL/min — ABNORMAL LOW (ref >60)
GFR calc non Af Amer: 15 mL/min — ABNORMAL LOW (ref >60)
Glucose, Bld: 234 mg/dL — ABNORMAL HIGH (ref 70–99)
Phosphorus: 2.7 mg/dL (ref 2.5–4.6)
Potassium: 4.5 mmol/L (ref 3.5–5.1)
Sodium: 131 mmol/L — ABNORMAL LOW (ref 135–145)

## 2020-01-07 LAB — MAGNESIUM: Magnesium: 1.8 mg/dL (ref 1.7–2.4)

## 2020-01-07 NOTE — Progress Notes (Signed)
[facility]  ROUNDING NOTE   Subjective:  Patient due for hemodialysis treatment today. Resting comfortably in bed at the moment.  Objective:  Vital signs in last 24 hours:  Temperature 90.6 pulse 117 respirations 25 blood pressure 102/47  Physical Exam: General: Critically ill-appearing  Head: Normocephalic, atraumatic. Moist oral mucosal membranes  Eyes: Anicteric  Neck: Tracheostomy in place  Lungs:  Scattered rhonchi bilateral, vent assisted  Heart: S1S2 irregular  Abdomen:  Soft, nontender, bowel sounds present  Extremities: 1+ peripheral edema.  Neurologic: Awake, alert, following commands  Skin: Bilateral upper extremity ecchymoses  Access: Right IJ PermCath    Basic Metabolic Panel: Recent Labs  Lab 12/31/19 1444 12/31/19 1444 01/03/20 0654 01/05/20 0438 01/07/20 0631  NA 128*  --  128* 129* 131*  K 3.6  --  4.0 4.5 4.5  CL 93*  --  94* 92* 94*  CO2 22  --  22 24 23   GLUCOSE 158*  --  190* 268* 234*  BUN 84*  --  84* 86* 88*  CREATININE 4.74*  --  4.68* 4.27* 3.90*  CALCIUM 8.1*   < > 8.5* 8.8* 8.4*  MG 1.9  --  2.1 2.2 1.8  PHOS 2.8  --  1.8* 2.4* 2.7   < > = values in this interval not displayed.    Liver Function Tests: Recent Labs  Lab 12/31/19 1444 01/03/20 0654 01/05/20 0438 01/07/20 0631  ALBUMIN 1.4* 1.6* 2.0* 2.0*   No results for input(s): LIPASE, AMYLASE in the last 168 hours. No results for input(s): AMMONIA in the last 168 hours.  CBC: Recent Labs  Lab 01/05/20 1503 01/05/20 2045 01/06/20 0537 01/06/20 1535 01/07/20 0631  WBC 28.4* 29.8* 25.2* 24.6* 23.1*  HGB 6.9* 7.9* 7.5* 7.1* 6.8*  HCT 21.5* 24.9* 24.0* 22.6* 22.1*  MCV 92.7 91.2 93.8 95.0 97.4  PLT 328 289 306 319 313    Cardiac Enzymes: No results for input(s): CKTOTAL, CKMB, CKMBINDEX, TROPONINI in the last 168 hours.  BNP: Invalid input(s): POCBNP  CBG: No results for input(s): GLUCAP in the last 168 hours.  Microbiology: Results for orders  placed or performed during the hospital encounter of 12/14/2019  C difficile quick scan w PCR reflex     Status: None   Collection Time: 12/18/19  4:49 AM   Specimen: STOOL  Result Value Ref Range Status   C Diff antigen NEGATIVE NEGATIVE Final   C Diff toxin NEGATIVE NEGATIVE Final   C Diff interpretation No C. difficile detected.  Corrected    Comment: Performed at [facility], [place] [address] [place], [place] [address] CORRECTED ON 03/13 AT 1510: PREVIOUSLY REPORTED AS VALID   Culture, respiratory (non-expectorated)     Status: None   Collection Time: 12/24/19  4:00 PM   Specimen: Tracheal Aspirate; Respiratory  Result Value Ref Range Status   Specimen Description TRACHEAL ASPIRATE  Final   Special Requests NONE  Final   Gram Stain   Final    ABUNDANT WBC PRESENT, PREDOMINANTLY PMN ABUNDANT GRAM POSITIVE RODS FEW GRAM NEGATIVE RODS Performed at [facility], [place] [address] [place], [place] [address]    Culture MODERATE PROTEUS MIRABILIS  Final   Report Status 12/26/2019 FINAL  Final   Organism ID, Bacteria PROTEUS MIRABILIS  Final      Susceptibility   Proteus mirabilis - MIC*    AMPICILLIN <=2 SENSITIVE Sensitive     CEFAZOLIN 8 SENSITIVE Sensitive  CEFEPIME <=0.12 SENSITIVE Sensitive     CEFTAZIDIME <=1 SENSITIVE Sensitive     CEFTRIAXONE <=0.25 SENSITIVE Sensitive     CIPROFLOXACIN <=0.25 SENSITIVE Sensitive     GENTAMICIN <=1 SENSITIVE Sensitive     IMIPENEM 4 SENSITIVE Sensitive     TRIMETH/SULFA <=20 SENSITIVE Sensitive     AMPICILLIN/SULBACTAM <=2 SENSITIVE Sensitive     PIP/TAZO <=4 SENSITIVE Sensitive     * MODERATE PROTEUS MIRABILIS  Culture, blood (routine x 2)     Status: None   Collection Time: 12/25/19  5:16 PM   Specimen: BLOOD  Result Value Ref Range Status   Specimen Description BLOOD RIGHT ANTECUBITAL  Final   Special Requests   Final    BOTTLES DRAWN AEROBIC AND ANAEROBIC Blood Culture adequate volume   Culture   Final    NO GROWTH  5 DAYS Performed at [facility], [place] [address], [place], [place] [address]    Report Status 12/30/2019 FINAL  Final  Culture, blood (routine x 2)     Status: None   Collection Time: 12/25/19  5:18 PM   Specimen: BLOOD  Result Value Ref Range Status   Specimen Description BLOOD RIGHT ANTECUBITAL  Final   Special Requests   Final    BOTTLES DRAWN AEROBIC AND ANAEROBIC Blood Culture adequate volume   Culture   Final    NO GROWTH 5 DAYS Performed at [facility], [place] [address], [place], [place] [address]    Report Status 12/30/2019 FINAL  Final  C difficile quick scan w PCR reflex     Status: Abnormal   Collection Time: 12/26/19 12:11 PM   Specimen: STOOL  Result Value Ref Range Status   C Diff antigen POSITIVE (A) NEGATIVE Final   C Diff toxin POSITIVE (A) NEGATIVE Final    Comment: CRITICAL RESULT CALLED TO, READ BACK BY AND VERIFIED WITH:  RN CHRISSY R. @1817  12/26/2019 AKT    C Diff interpretation Toxin producing C. difficile detected.  Final  Culture, blood (routine x 2)     Status: None (Preliminary result)   Collection Time: 01/05/20 12:43 PM   Specimen: BLOOD  Result Value Ref Range Status   Specimen Description BLOOD RIGHT ANTECUBITAL  Final   Special Requests   Final    BOTTLES DRAWN AEROBIC AND ANAEROBIC Blood Culture adequate volume   Culture   Final    NO GROWTH < 24 HOURS Performed at [facility], [place] [address], [place], [place] [address]    Report Status PENDING  Incomplete  Culture, respiratory (non-expectorated)     Status: None (Preliminary result)   Collection Time: 01/05/20  2:55 PM   Specimen: Tracheal Aspirate; Respiratory  Result Value Ref Range Status   Specimen Description TRACHEAL ASPIRATE  Final   Special Requests NONE  Final   Gram Stain   Final    ABUNDANT WBC PRESENT,BOTH PMN AND MONONUCLEAR ABUNDANT GRAM NEGATIVE RODS    Culture   Final    ABUNDANT KLEBSIELLA PNEUMONIAE CULTURE REINCUBATED FOR BETTER  GROWTH SUSCEPTIBILITIES TO FOLLOW Performed at [facility], [place] [address] [place], [place] [address]    Report Status PENDING  Incomplete  Culture, blood (routine x 2)     Status: None (Preliminary result)   Collection Time: 01/05/20  3:04 PM   Specimen: BLOOD  Result Value Ref Range Status   Specimen Description BLOOD RIGHT ANTECUBITAL  Final   Special Requests   Final  BOTTLES DRAWN AEROBIC ONLY Blood Culture adequate volume   Culture   Final    NO GROWTH < 24 HOURS Performed at [facility], [place] [address], [place], [place] [address]    Report Status PENDING  Incomplete    Coagulation Studies: Recent Labs    01/06/20 1535  LABPROT 16.9*  INR 1.4*    Urinalysis: No results for input(s): COLORURINE, LABSPEC, PHURINE, GLUCOSEU, HGBUR, BILIRUBINUR, KETONESUR, PROTEINUR, UROBILINOGEN, NITRITE, LEUKOCYTESUR in the last 72 hours.  Invalid input(s): APPERANCEUR    Imaging: No results found.   Medications:     iohexol  Assessment/ Plan:  69 y.o. male with a PMHx of ESRD on HD, CHF, aortic stenosis, hypertension, diabetes mellitus type 2, hyperlipidemia, obstructive sleep apnea, chronic venous stasis with LE edema, and obesity who was admitted [facility] 12/29/2019 for ongoing treatment of respiratory failure, ESRD, malnutrition, and generalized debility.    1.  ESRD on HD MWF.    Patient due for dialysis treatment today.  Orders have been prepared.  2.  Acute respiratory failure.    Weaning and vent support as per pulmonary/critical care.  3.  Secondary hyperparathyroidism.    Phosphorus 2.7 and acceptable.  4.  Anemia of chronic kidney disease.  Hemoglobin down to 6.8.  Consider blood transfusion today.  Discussed with hospitalist.    LOS: 0 Darica Goren 4/2/202110:31 AM

## 2020-01-07 NOTE — Progress Notes (Addendum)
          Daily Rounding Note  01/07/2020, 12:19 PM  LOS: 0 days   SUBJECTIVE:   Chief complaint:  Anemia.  FOBT + stool   RN saw black, loose stool this AM    Lab Results: Recent Labs    01/06/20 0537 01/06/20 1535 01/07/20 0631  WBC 25.2* 24.6* 23.1*  HGB 7.5* 7.1* 6.8*  HCT 24.0* 22.6* 22.1*  PLT 306 319 313   BMET Recent Labs    01/05/20 0438 01/07/20 0631  NA 129* 131*  K 4.5 4.5  CL 92* 94*  CO2 24 23  GLUCOSE 268* 234*  BUN 86* 88*  CREATININE 4.27* 3.90*  CALCIUM 8.8* 8.4*   LFT Recent Labs    01/05/20 0438 01/07/20 0631  ALBUMIN 2.0* 2.0*   PT/INR Recent Labs    01/06/20 1535  LABPROT 16.9*  INR 1.4*     ASSESMENT:   *   FOBT +, transfusion requiring anemia.  No major hemorrhage.  EGD 12/05/19: linear, non-bleeding lower esophageal erosions.  IV Protonix in place.   Hgb back to 6.8 this AM, was 7.9 on 3/31 after 1 PRBC.    *   S/p feeding G tube.  Tolerating tube feeds  *   ESRD.  On HD  *   Acute on chronic resp failure.  VDRF, trach in place.    *    Hypotension, remains on Levophed.    PLAN   *   Per Dr Carlean Purl.  No plans per endoscopic wup at present.      Azucena Freed  01/07/2020, 12:19 PM Phone [phone]  Agree with Ms. Sandi Carne assessment and plan.  Declining Hgb in setting of multi-system organ failure persists.  I am afraid there is little I can offer while he is in [facility] and even if he was transferred to ICU at [facility] not convinced I would scope him. I believe he has little chance of survival despite the heroic efforts you are making.  Gatha Mayer, MD, [facility] 01/07/2020 4:36 PM

## 2020-01-07 NOTE — Progress Notes (Signed)
Pulmonary Critical Care Medicine [place]   PULMONARY CRITICAL CARE SERVICE  PROGRESS NOTE  Date of Service: 01/07/2020  Terell Kincy  OFB:[id number]  DOB: Apr 29, 1951   DOA: 12/16/2019  Referring Physician: Merton Border, MD  HPI: Yaden Seith is a 69 y.o. male seen for follow up of Acute on Chronic Respiratory Failure.  Patient currently is on hemodialysis on assist control mode full support.  Not tolerating weaning still remains on Levophed  Medications: Reviewed on Rounds  Physical Exam:  Vitals: Temperature is 98.6 pulse 119 respiratory rate 25 blood pressure is 102/47 saturations 100%  Ventilator Settings on the ventilator assist control FiO2 45% tidal volume 500 PEEP 5  . General: Comfortable at this time . Eyes: Grossly normal lids, irises & conjunctiva . ENT: grossly tongue is normal . Neck: no obvious mass . Cardiovascular: S1 S2 normal no gallop . Respiratory: Scattered rhonchi expansion is equal . Abdomen: soft . Skin: no rash seen on limited exam . Musculoskeletal: not rigid . Psychiatric:unable to assess . Neurologic: no seizure no involuntary movements         Lab Data:   Basic Metabolic Panel: Recent Labs  Lab 12/31/19 1444 01/03/20 0654 01/05/20 0438 01/07/20 0631  NA 128* 128* 129* 131*  K 3.6 4.0 4.5 4.5  CL 93* 94* 92* 94*  CO2 22 22 24 23   GLUCOSE 158* 190* 268* 234*  BUN 84* 84* 86* 88*  CREATININE 4.74* 4.68* 4.27* 3.90*  CALCIUM 8.1* 8.5* 8.8* 8.4*  MG 1.9 2.1 2.2 1.8  PHOS 2.8 1.8* 2.4* 2.7    ABG: No results for input(s): PHART, PCO2ART, PO2ART, HCO3, O2SAT in the last 168 hours.  Liver Function Tests: Recent Labs  Lab 12/31/19 1444 01/03/20 0654 01/05/20 0438 01/07/20 0631  ALBUMIN 1.4* 1.6* 2.0* 2.0*   No results for input(s): LIPASE, AMYLASE in the last 168 hours. No results for input(s): AMMONIA in the last 168 hours.  CBC: Recent Labs  Lab 01/05/20 1503 01/05/20 2045 01/06/20 0537  01/06/20 1535 01/07/20 0631  WBC 28.4* 29.8* 25.2* 24.6* 23.1*  HGB 6.9* 7.9* 7.5* 7.1* 6.8*  HCT 21.5* 24.9* 24.0* 22.6* 22.1*  MCV 92.7 91.2 93.8 95.0 97.4  PLT 328 289 306 319 313    Cardiac Enzymes: No results for input(s): CKTOTAL, CKMB, CKMBINDEX, TROPONINI in the last 168 hours.  BNP (last 3 results) No results for input(s): BNP in the last 8760 hours.  ProBNP (last 3 results) No results for input(s): PROBNP in the last 8760 hours.  Radiological Exams: No results found.  Assessment/Plan Active Problems:   Acute on chronic respiratory failure with hypoxia (HCC)   Chronic systolic (congestive) heart failure (HCC)   End stage renal disease on dialysis [facility])   Chronic atrial fibrillation (HCC)   Bilateral pleural effusion   1. Acute on chronic respiratory failure hypoxia plan is to continue with full support on the ventilator while the patient is requiring pressors and also issues with bleeding 2. Chronic systolic heart failure right now is compensated monitor fluid status 3. End-stage renal disease on hemodialysis 4. Chronic atrial fibrillation rate controlled 5. Bilateral effusions following radiologically   I have personally seen and evaluated the patient, evaluated laboratory and imaging results, formulated the assessment and plan and placed orders. The Patient requires high complexity decision making with multiple systems involvement.  Rounds were done with the Respiratory Therapy Director and Staff therapists and discussed with nursing staff also.  Allyne Gee, MD [facility] Pulmonary  Critical Care Medicine Sleep Medicine

## 2020-01-08 DIAGNOSIS — I482 Chronic atrial fibrillation, unspecified: Secondary | ICD-10-CM | POA: Diagnosis not present

## 2020-01-08 DIAGNOSIS — J90 Pleural effusion, not elsewhere classified: Secondary | ICD-10-CM | POA: Diagnosis not present

## 2020-01-08 DIAGNOSIS — I5022 Chronic systolic (congestive) heart failure: Secondary | ICD-10-CM | POA: Diagnosis not present

## 2020-01-08 DIAGNOSIS — J9621 Acute and chronic respiratory failure with hypoxia: Secondary | ICD-10-CM | POA: Diagnosis not present

## 2020-01-08 LAB — CBC
HCT: 29.6 % — ABNORMAL LOW (ref 39.0–52.0)
Hemoglobin: 9.2 g/dL — ABNORMAL LOW (ref 13.0–17.0)
MCH: 30 pg (ref 26.0–34.0)
MCHC: 31.1 g/dL (ref 30.0–36.0)
MCV: 96.4 fL (ref 80.0–100.0)
Platelets: 284 10*3/uL (ref 150–400)
RBC: 3.07 MIL/uL — ABNORMAL LOW (ref 4.22–5.81)
RDW: 21.7 % — ABNORMAL HIGH (ref 11.5–15.5)
WBC: 21.2 10*3/uL — ABNORMAL HIGH (ref 4.0–10.5)
nRBC: 0.7 % — ABNORMAL HIGH (ref 0.0–0.2)

## 2020-01-08 LAB — TYPE AND SCREEN
ABO/RH(D): A POS
Antibody Screen: NEGATIVE
Unit division: 0
Unit division: 0
Unit division: 0
Unit division: 0

## 2020-01-08 LAB — BPAM RBC
Blood Product Expiration Date: 202104232359
Blood Product Expiration Date: 202104252359
Blood Product Expiration Date: 202104302359
Blood Product Expiration Date: 202105012359
ISSUE DATE / TIME: 202103310956
ISSUE DATE / TIME: 202103311605
ISSUE DATE / TIME: 202104021333
ISSUE DATE / TIME: 202104021348
Unit Type and Rh: 6200
Unit Type and Rh: 6200
Unit Type and Rh: 6200
Unit Type and Rh: 6200

## 2020-01-08 NOTE — Progress Notes (Signed)
Pulmonary Critical Care Medicine [place]   PULMONARY CRITICAL CARE SERVICE  PROGRESS NOTE  Date of Service: 01/08/2020  Jose Tucker  [id number]:[id number]  DOB: September 21, 1951   DOA: 12/08/2019  Referring Physician: Merton Border, MD  HPI: Jose Tucker is a 69 y.o. male seen for follow up of Acute on Chronic Respiratory Failure.  Patient still not doing well from a pulmonary perspective not able to do any weaning remains on the ventilator right now on assist control mode currently on 45% FiO2  Medications: Reviewed on Rounds  Physical Exam:  Vitals: Temperature is 98.1 pulse 105 respiratory 18 blood pressure is 131/57 saturations 96%  Ventilator Settings on assist control FiO2 45% tidal volume 577 PEEP 5  . General: Comfortable at this time . Eyes: Grossly normal lids, irises & conjunctiva . ENT: grossly tongue is normal . Neck: no obvious mass . Cardiovascular: S1 S2 normal no gallop . Respiratory: No rhonchi no rales are noted at this time . Abdomen: soft . Skin: no rash seen on limited exam . Musculoskeletal: not rigid . Psychiatric:unable to assess . Neurologic: no seizure no involuntary movements         Lab Data:   Basic Metabolic Panel: Recent Labs  Lab 01/03/20 0654 01/05/20 0438 01/07/20 0631  NA 128* 129* 131*  K 4.0 4.5 4.5  CL 94* 92* 94*  CO2 22 24 23   GLUCOSE 190* 268* 234*  BUN 84* 86* 88*  CREATININE 4.68* 4.27* 3.90*  CALCIUM 8.5* 8.8* 8.4*  MG 2.1 2.2 1.8  PHOS 1.8* 2.4* 2.7    ABG: No results for input(s): PHART, PCO2ART, PO2ART, HCO3, O2SAT in the last 168 hours.  Liver Function Tests: Recent Labs  Lab 01/03/20 0654 01/05/20 0438 01/07/20 0631  ALBUMIN 1.6* 2.0* 2.0*   No results for input(s): LIPASE, AMYLASE in the last 168 hours. No results for input(s): AMMONIA in the last 168 hours.  CBC: Recent Labs  Lab 01/05/20 2045 01/06/20 0537 01/06/20 1535 01/07/20 0631 01/08/20 0659  WBC 29.8* 25.2* 24.6* 23.1*  21.2*  HGB 7.9* 7.5* 7.1* 6.8* 9.2*  HCT 24.9* 24.0* 22.6* 22.1* 29.6*  MCV 91.2 93.8 95.0 97.4 96.4  PLT 289 306 319 313 284    Cardiac Enzymes: No results for input(s): CKTOTAL, CKMB, CKMBINDEX, TROPONINI in the last 168 hours.  BNP (last 3 results) No results for input(s): BNP in the last 8760 hours.  ProBNP (last 3 results) No results for input(s): PROBNP in the last 8760 hours.  Radiological Exams: No results found.  Assessment/Plan Active Problems:   Acute on chronic respiratory failure with hypoxia (HCC)   Chronic systolic (congestive) heart failure (HCC)   End stage renal disease on dialysis [facility])   Chronic atrial fibrillation (HCC)   Bilateral pleural effusion   1. Acute on chronic respiratory failure with hypoxia we will continue with assist control mode patient's titrating oxygen as tolerated 2. Chronic systolic heart failure compensated at this time patient's hemoglobin is improved also 3. End-stage renal disease on hemodialysis 4. Chronic atrial fibrillation rate controlled 5. Bilateral pleural effusions we will continue to monitor closely.   I have personally seen and evaluated the patient, evaluated laboratory and imaging results, formulated the assessment and plan and placed orders. The Patient requires high complexity decision making with multiple systems involvement.  Rounds were done with the Respiratory Therapy Director and Staff therapists and discussed with nursing staff also.  Allyne Gee, MD [facility] Pulmonary Critical Care Medicine Sleep Medicine

## 2020-01-09 DIAGNOSIS — I5022 Chronic systolic (congestive) heart failure: Secondary | ICD-10-CM | POA: Diagnosis not present

## 2020-01-09 DIAGNOSIS — I482 Chronic atrial fibrillation, unspecified: Secondary | ICD-10-CM | POA: Diagnosis not present

## 2020-01-09 DIAGNOSIS — J90 Pleural effusion, not elsewhere classified: Secondary | ICD-10-CM | POA: Diagnosis not present

## 2020-01-09 DIAGNOSIS — J9621 Acute and chronic respiratory failure with hypoxia: Secondary | ICD-10-CM | POA: Diagnosis not present

## 2020-01-09 LAB — CBC
HCT: 29 % — ABNORMAL LOW (ref 39.0–52.0)
Hemoglobin: 9 g/dL — ABNORMAL LOW (ref 13.0–17.0)
MCH: 30.9 pg (ref 26.0–34.0)
MCHC: 31 g/dL (ref 30.0–36.0)
MCV: 99.7 fL (ref 80.0–100.0)
Platelets: 186 10*3/uL (ref 150–400)
RBC: 2.91 MIL/uL — ABNORMAL LOW (ref 4.22–5.81)
RDW: 23.9 % — ABNORMAL HIGH (ref 11.5–15.5)
WBC: 19.6 10*3/uL — ABNORMAL HIGH (ref 4.0–10.5)
nRBC: 0.6 % — ABNORMAL HIGH (ref 0.0–0.2)

## 2020-01-09 NOTE — Progress Notes (Signed)
Pulmonary Critical Care Medicine [place]   PULMONARY CRITICAL CARE SERVICE  PROGRESS NOTE  Date of Service: 01/09/2020  Jose Tucker  [id number]:[id number]  DOB: 12/22/1950   DOA: 12/11/2019  Referring Physician: Merton Border, MD  HPI: Jose Tucker is a 69 y.o. male seen for follow up of Acute on Chronic Respiratory Failure.  Currently is on pressure support has been on 50% FiO2 12/5 for a goal of 4 hours  Medications: Reviewed on Rounds  Physical Exam:  Vitals: Temperature is 98.3 pulse 105 respiratory rate 25 blood pressure is 134/68 saturations 98%  Ventilator Settings on pressure support FiO2 50% pressure support 12 PEEP 5  . General: Comfortable at this time . Eyes: Grossly normal lids, irises & conjunctiva . ENT: grossly tongue is normal . Neck: no obvious mass . Cardiovascular: S1 S2 normal no gallop . Respiratory: No rhonchi coarse breath sounds . Abdomen: soft . Skin: no rash seen on limited exam . Musculoskeletal: not rigid . Psychiatric:unable to assess . Neurologic: no seizure no involuntary movements         Lab Data:   Basic Metabolic Panel: Recent Labs  Lab 01/03/20 0654 01/05/20 0438 01/07/20 0631  NA 128* 129* 131*  K 4.0 4.5 4.5  CL 94* 92* 94*  CO2 22 24 23   GLUCOSE 190* 268* 234*  BUN 84* 86* 88*  CREATININE 4.68* 4.27* 3.90*  CALCIUM 8.5* 8.8* 8.4*  MG 2.1 2.2 1.8  PHOS 1.8* 2.4* 2.7    ABG: No results for input(s): PHART, PCO2ART, PO2ART, HCO3, O2SAT in the last 168 hours.  Liver Function Tests: Recent Labs  Lab 01/03/20 0654 01/05/20 0438 01/07/20 0631  ALBUMIN 1.6* 2.0* 2.0*   No results for input(s): LIPASE, AMYLASE in the last 168 hours. No results for input(s): AMMONIA in the last 168 hours.  CBC: Recent Labs  Lab 01/06/20 0537 01/06/20 1535 01/07/20 0631 01/08/20 0659 01/09/20 0536  WBC 25.2* 24.6* 23.1* 21.2* 19.6*  HGB 7.5* 7.1* 6.8* 9.2* 9.0*  HCT 24.0* 22.6* 22.1* 29.6* 29.0*  MCV 93.8 95.0  97.4 96.4 99.7  PLT 306 319 313 284 186    Cardiac Enzymes: No results for input(s): CKTOTAL, CKMB, CKMBINDEX, TROPONINI in the last 168 hours.  BNP (last 3 results) No results for input(s): BNP in the last 8760 hours.  ProBNP (last 3 results) No results for input(s): PROBNP in the last 8760 hours.  Radiological Exams: No results found.  Assessment/Plan Active Problems:   Acute on chronic respiratory failure with hypoxia (HCC)   Chronic systolic (congestive) heart failure (HCC)   End stage renal disease on dialysis [facility])   Chronic atrial fibrillation (HCC)   Bilateral pleural effusion   1. Acute on chronic respiratory failure hypoxia plan is to continue with the wean on protocol goal of 4 hours 2. Chronic systolic heart failure compensated we will continue with present management 3. End-stage renal disease on hemodialysis 4. Chronic atrial fibrillation rate is controlled 5. Bilateral effusions following x-ray isTo be ordered   I have personally seen and evaluated the patient, evaluated laboratory and imaging results, formulated the assessment and plan and placed orders. The Patient requires high complexity decision making with multiple systems involvement.  Rounds were done with the Respiratory Therapy Director and Staff therapists and discussed with nursing staff also.  Allyne Gee, MD [facility] Pulmonary Critical Care Medicine Sleep Medicine

## 2020-01-10 DIAGNOSIS — I482 Chronic atrial fibrillation, unspecified: Secondary | ICD-10-CM | POA: Diagnosis not present

## 2020-01-10 DIAGNOSIS — I5022 Chronic systolic (congestive) heart failure: Secondary | ICD-10-CM | POA: Diagnosis not present

## 2020-01-10 DIAGNOSIS — J9621 Acute and chronic respiratory failure with hypoxia: Secondary | ICD-10-CM | POA: Diagnosis not present

## 2020-01-10 DIAGNOSIS — J90 Pleural effusion, not elsewhere classified: Secondary | ICD-10-CM | POA: Diagnosis not present

## 2020-01-10 LAB — CULTURE, BLOOD (ROUTINE X 2)
Culture: NO GROWTH
Culture: NO GROWTH
Special Requests: ADEQUATE
Special Requests: ADEQUATE

## 2020-01-10 LAB — RENAL FUNCTION PANEL
Albumin: 2.2 g/dL — ABNORMAL LOW (ref 3.5–5.0)
Anion gap: 18 — ABNORMAL HIGH (ref 5–15)
BUN: 141 mg/dL — ABNORMAL HIGH (ref 8–23)
CO2: 18 mmol/L — ABNORMAL LOW (ref 22–32)
Calcium: 9 mg/dL (ref 8.9–10.3)
Chloride: 97 mmol/L — ABNORMAL LOW (ref 98–111)
Creatinine, Ser: 4.56 mg/dL — ABNORMAL HIGH (ref 0.61–1.24)
GFR calc Af Amer: 14 mL/min — ABNORMAL LOW (ref >60)
GFR calc non Af Amer: 12 mL/min — ABNORMAL LOW (ref >60)
Glucose, Bld: 213 mg/dL — ABNORMAL HIGH (ref 70–99)
Phosphorus: 4.5 mg/dL (ref 2.5–4.6)
Potassium: 6.2 mmol/L — ABNORMAL HIGH (ref 3.5–5.1)
Sodium: 133 mmol/L — ABNORMAL LOW (ref 135–145)

## 2020-01-10 LAB — CBC
HCT: 27.5 % — ABNORMAL LOW (ref 39.0–52.0)
Hemoglobin: 8.6 g/dL — ABNORMAL LOW (ref 13.0–17.0)
MCH: 30.4 pg (ref 26.0–34.0)
MCHC: 31.3 g/dL (ref 30.0–36.0)
MCV: 97.2 fL (ref 80.0–100.0)
Platelets: 129 10*3/uL — ABNORMAL LOW (ref 150–400)
RBC: 2.83 MIL/uL — ABNORMAL LOW (ref 4.22–5.81)
RDW: 24.3 % — ABNORMAL HIGH (ref 11.5–15.5)
WBC: 17.9 10*3/uL — ABNORMAL HIGH (ref 4.0–10.5)
nRBC: 0.1 % (ref 0.0–0.2)

## 2020-01-10 NOTE — Progress Notes (Signed)
[facility]  ROUNDING NOTE   Subjective:  Overall patient remains critically ill. Still on the ventilator and on pressors. Case discussed with dialysis nurse. Heavy inpatient volumes at the moment therefore patient may be dialyzed tomorrow.  Objective:  Vital signs in last 24 hours:  Temperature 97.4 pulse 85 respirations 18 blood pressure 126/65  Physical Exam: General: Critically ill-appearing  Head: Normocephalic, atraumatic. Moist oral mucosal membranes  Eyes: Anicteric  Neck: Tracheostomy in place  Lungs:  Scattered rhonchi bilateral, vent assisted  Heart: S1S2 irregular  Abdomen:  Soft, nontender, bowel sounds present  Extremities: 1+ peripheral edema.  Neurologic: Awake, alert, following commands  Skin: Bilateral upper extremity ecchymoses  Access: Right IJ PermCath    Basic Metabolic Panel: Recent Labs  Lab 01/05/20 0438 01/07/20 0631  NA 129* 131*  K 4.5 4.5  CL 92* 94*  CO2 24 23  GLUCOSE 268* 234*  BUN 86* 88*  CREATININE 4.27* 3.90*  CALCIUM 8.8* 8.4*  MG 2.2 1.8  PHOS 2.4* 2.7    Liver Function Tests: Recent Labs  Lab 01/05/20 0438 01/07/20 0631  ALBUMIN 2.0* 2.0*   No results for input(s): LIPASE, AMYLASE in the last 168 hours. No results for input(s): AMMONIA in the last 168 hours.  CBC: Recent Labs  Lab 01/06/20 0537 01/06/20 1535 01/07/20 0631 01/08/20 0659 01/09/20 0536  WBC 25.2* 24.6* 23.1* 21.2* 19.6*  HGB 7.5* 7.1* 6.8* 9.2* 9.0*  HCT 24.0* 22.6* 22.1* 29.6* 29.0*  MCV 93.8 95.0 97.4 96.4 99.7  PLT 306 319 313 284 186    Cardiac Enzymes: No results for input(s): CKTOTAL, CKMB, CKMBINDEX, TROPONINI in the last 168 hours.  BNP: Invalid input(s): POCBNP  CBG: No results for input(s): GLUCAP in the last 168 hours.  Microbiology: Results for orders placed or performed during the hospital encounter of 12/17/2019  C difficile quick scan w PCR reflex     Status: None   Collection Time: 12/18/19  4:49 AM   Specimen: STOOL  Result Value Ref Range Status   C Diff antigen NEGATIVE NEGATIVE Final   C Diff toxin NEGATIVE NEGATIVE Final   C Diff interpretation No C. difficile detected.  Corrected    Comment: Performed at [facility], [place] [address] [place], [place] [address] CORRECTED ON 03/13 AT 1510: PREVIOUSLY REPORTED AS VALID   Culture, respiratory (non-expectorated)     Status: None   Collection Time: 12/24/19  4:00 PM   Specimen: Tracheal Aspirate; Respiratory  Result Value Ref Range Status   Specimen Description TRACHEAL ASPIRATE  Final   Special Requests NONE  Final   Gram Stain   Final    ABUNDANT WBC PRESENT, PREDOMINANTLY PMN ABUNDANT GRAM POSITIVE RODS FEW GRAM NEGATIVE RODS Performed at [facility], [place] [address], [place], [place] [address]    Culture MODERATE PROTEUS MIRABILIS  Final   Report Status 12/26/2019 FINAL  Final   Organism ID, Bacteria PROTEUS MIRABILIS  Final      Susceptibility   Proteus mirabilis - MIC*    AMPICILLIN <=2 SENSITIVE Sensitive     CEFAZOLIN 8 SENSITIVE Sensitive     CEFEPIME <=0.12 SENSITIVE Sensitive     CEFTAZIDIME <=1 SENSITIVE Sensitive     CEFTRIAXONE <=0.25 SENSITIVE Sensitive     CIPROFLOXACIN <=0.25 SENSITIVE Sensitive     GENTAMICIN <=1 SENSITIVE Sensitive     IMIPENEM 4 SENSITIVE Sensitive     TRIMETH/SULFA <=20 SENSITIVE Sensitive     AMPICILLIN/SULBACTAM <=2 SENSITIVE  Sensitive     PIP/TAZO <=4 SENSITIVE Sensitive     * MODERATE PROTEUS MIRABILIS  Culture, blood (routine x 2)     Status: None   Collection Time: 12/25/19  5:16 PM   Specimen: BLOOD  Result Value Ref Range Status   Specimen Description BLOOD RIGHT ANTECUBITAL  Final   Special Requests   Final    BOTTLES DRAWN AEROBIC AND ANAEROBIC Blood Culture adequate volume   Culture   Final    NO GROWTH 5 DAYS Performed at [facility], [address], [place], [place] [address]    Report Status 12/30/2019 FINAL  Final  Culture, blood (routine x 2)      Status: None   Collection Time: 12/25/19  5:18 PM   Specimen: BLOOD  Result Value Ref Range Status   Specimen Description BLOOD RIGHT ANTECUBITAL  Final   Special Requests   Final    BOTTLES DRAWN AEROBIC AND ANAEROBIC Blood Culture adequate volume   Culture   Final    NO GROWTH 5 DAYS Performed at [facility], [place] [address], [place], [place] [address]    Report Status 12/30/2019 FINAL  Final  C difficile quick scan w PCR reflex     Status: Abnormal   Collection Time: 12/26/19 12:11 PM   Specimen: STOOL  Result Value Ref Range Status   C Diff antigen POSITIVE (A) NEGATIVE Final   C Diff toxin POSITIVE (A) NEGATIVE Final    Comment: CRITICAL RESULT CALLED TO, READ BACK BY AND VERIFIED WITH:  RN CHRISSY R. @1817  12/26/2019 AKT    C Diff interpretation Toxin producing C. difficile detected.  Final  Culture, blood (routine x 2)     Status: None (Preliminary result)   Collection Time: 01/05/20 12:43 PM   Specimen: BLOOD  Result Value Ref Range Status   Specimen Description BLOOD RIGHT ANTECUBITAL  Final   Special Requests   Final    BOTTLES DRAWN AEROBIC AND ANAEROBIC Blood Culture adequate volume   Culture   Final    NO GROWTH 4 DAYS Performed at [facility], [address], [place], [place] [address]    Report Status PENDING  Incomplete  Culture, respiratory (non-expectorated)     Status: None (Preliminary result)   Collection Time: 01/05/20  2:55 PM   Specimen: Tracheal Aspirate; Respiratory  Result Value Ref Range Status   Specimen Description TRACHEAL ASPIRATE  Final   Special Requests NONE  Final   Gram Stain   Final    ABUNDANT WBC PRESENT,BOTH PMN AND MONONUCLEAR ABUNDANT GRAM NEGATIVE RODS    Culture   Final    ABUNDANT KLEBSIELLA PNEUMONIAE MODERATE PROTEUS MIRABILIS SUSCEPTIBILITIES TO FOLLOW Performed at [facility], [place] [address], [place],  [address]    Report Status PENDING  Incomplete   Organism ID, Bacteria PROTEUS  MIRABILIS  Final      Susceptibility   Proteus mirabilis - MIC*    AMPICILLIN <=2 SENSITIVE Sensitive     CEFAZOLIN <=4 SENSITIVE Sensitive     CEFEPIME <=0.12 SENSITIVE Sensitive     CEFTAZIDIME <=1 SENSITIVE Sensitive     CEFTRIAXONE <=0.25 SENSITIVE Sensitive     CIPROFLOXACIN <=0.25 SENSITIVE Sensitive     GENTAMICIN <=1 SENSITIVE Sensitive     IMIPENEM 8 INTERMEDIATE Intermediate     TRIMETH/SULFA <=20 SENSITIVE Sensitive     AMPICILLIN/SULBACTAM <=2 SENSITIVE Sensitive     PIP/TAZO <=4 SENSITIVE Sensitive     *  MODERATE PROTEUS MIRABILIS  Culture, blood (routine x 2)     Status: None (Preliminary result)   Collection Time: 01/05/20  3:04 PM   Specimen: BLOOD  Result Value Ref Range Status   Specimen Description BLOOD RIGHT ANTECUBITAL  Final   Special Requests   Final    BOTTLES DRAWN AEROBIC ONLY Blood Culture adequate volume   Culture   Final    NO GROWTH 4 DAYS Performed at [facility], [address], [place], [place] [address]    Report Status PENDING  Incomplete    Coagulation Studies: No results for input(s): LABPROT, INR in the last 72 hours.  Urinalysis: No results for input(s): COLORURINE, LABSPEC, PHURINE, GLUCOSEU, HGBUR, BILIRUBINUR, KETONESUR, PROTEINUR, UROBILINOGEN, NITRITE, LEUKOCYTESUR in the last 72 hours.  Invalid input(s): APPERANCEUR    Imaging: No results found.   Medications:     iohexol  Assessment/ Plan:  69 y.o. male with a PMHx of ESRD on HD, CHF, aortic stenosis, hypertension, diabetes mellitus type 2, hyperlipidemia, obstructive sleep apnea, chronic venous stasis with LE edema, and obesity who was admitted to [facility] on 12/26/2019 for ongoing treatment of respiratory failure, ESRD, malnutrition, and generalized debility.    1.  ESRD on HD MWF.   Heavy inpatient dialysis volume at the moment for dialysis RN. Therefore patient will be dialyzed tomorrow. Continue to monitor progress.  2.  Acute respiratory failure.   Patient  remains on the ventilator. Weaning as per pulmonary/critical care.  3.  Secondary hyperparathyroidism.   Continue to monitor bone metabolism parameters.  4.  Anemia of chronic kidney disease. Hemoglobin up to 9.0 post transfusion. Also on Protonix for suspected GI bleed.    LOS: 0 Adda Stokes 4/5/20218:27 AM

## 2020-01-10 NOTE — Progress Notes (Signed)
Pulmonary [place]   PULMONARY CRITICAL CARE SERVICE  PROGRESS NOTE  Date of Service: 01/10/2020  Jose Tucker  [id number]  DOB: August 08, 1951   DOA: 12/10/2019  Referring Physician: Merton Border, MD  HPI: Jose Tucker is a 69 y.o. male seen for follow up of Acute on Chronic Respiratory Failure.  Patient at this time is on full support on assist control mode has been on 40% FiO2 with a PEEP of 5  Medications: Reviewed on Rounds  Physical Exam:  Vitals: Temperature is 97.4 pulse 85 respiratory rate 18 blood pressure is 126/65 saturations 100%  Ventilator Settings on assist control FiO2 40% tidal volume 500 PEEP 5  . General: Comfortable at this time . Eyes: Grossly normal lids, irises & conjunctiva . ENT: grossly tongue is normal . Neck: no obvious mass . Cardiovascular: S1 S2 normal no gallop . Respiratory: No rhonchi coarse breath sounds . Abdomen: soft . Skin: no rash seen on limited exam . Musculoskeletal: not rigid . Psychiatric:unable to assess . Neurologic: no seizure no involuntary movements         Lab Data:   Basic Metabolic Panel: Recent Labs  Lab 01/05/20 0438 01/07/20 0631  NA 129* 131*  K 4.5 4.5  CL 92* 94*  CO2 24 23  GLUCOSE 268* 234*  BUN 86* 88*  CREATININE 4.27* 3.90*  CALCIUM 8.8* 8.4*  MG 2.2 1.8  PHOS 2.4* 2.7    ABG: No results for input(s): PHART, PCO2ART, PO2ART, HCO3, O2SAT in the last 168 hours.  Liver Function Tests: Recent Labs  Lab 01/05/20 0438 01/07/20 0631  ALBUMIN 2.0* 2.0*   No results for input(s): LIPASE, AMYLASE in the last 168 hours. No results for input(s): AMMONIA in the last 168 hours.  CBC: Recent Labs  Lab 01/06/20 0537 01/06/20 1535 01/07/20 0631 01/08/20 0659 01/09/20 0536  WBC 25.2* 24.6* 23.1* 21.2* 19.6*  HGB 7.5* 7.1* 6.8* 9.2* 9.0*  HCT 24.0* 22.6* 22.1* 29.6* 29.0*  MCV 93.8 95.0 97.4 96.4 99.7  PLT 306 319 313 284 186    Cardiac  Enzymes: No results for input(s): CKTOTAL, CKMB, CKMBINDEX, TROPONINI in the last 168 hours.  BNP (last 3 results) No results for input(s): BNP in the last 8760 hours.  ProBNP (last 3 results) No results for input(s): PROBNP in the last 8760 hours.  Radiological Exams: No results found.  Assessment/Plan Active Problems:   Acute on chronic respiratory failure with hypoxia (HCC)   Chronic systolic (congestive) heart failure (HCC)   End stage renal disease on dialysis [facility])   Chronic atrial fibrillation (HCC)   Bilateral pleural effusion   1. Acute on chronic respiratory failure hypoxia we will continue to wean on assist control titrate oxygen continue pulmonary toilet 2. Chronic systolic heart failure compensated we will continue to monitor 3. End-stage renal disease on hemodialysis 4. Chronic atrial fibrillation rate controlled 5. Bilateral pleural effusion at baseline we will continue with supportive care   I have personally seen and evaluated the patient, evaluated laboratory and imaging results, formulated the assessment and plan and placed orders. The Patient requires high complexity decision making with multiple systems involvement.  Rounds were done with the Respiratory Therapy Director and Staff therapists and discussed with nursing staff also.  Allyne Gee, MD [facility] Pulmonary Critical Care Medicine Sleep Medicine

## 2020-01-11 DIAGNOSIS — I5022 Chronic systolic (congestive) heart failure: Secondary | ICD-10-CM | POA: Diagnosis not present

## 2020-01-11 DIAGNOSIS — J90 Pleural effusion, not elsewhere classified: Secondary | ICD-10-CM | POA: Diagnosis not present

## 2020-01-11 DIAGNOSIS — I482 Chronic atrial fibrillation, unspecified: Secondary | ICD-10-CM | POA: Diagnosis not present

## 2020-01-11 DIAGNOSIS — J9621 Acute and chronic respiratory failure with hypoxia: Secondary | ICD-10-CM | POA: Diagnosis not present

## 2020-01-11 LAB — RENAL FUNCTION PANEL
Albumin: 2.1 g/dL — ABNORMAL LOW (ref 3.5–5.0)
Anion gap: 16 — ABNORMAL HIGH (ref 5–15)
BUN: 140 mg/dL — ABNORMAL HIGH (ref 8–23)
CO2: 19 mmol/L — ABNORMAL LOW (ref 22–32)
Calcium: 8.6 mg/dL — ABNORMAL LOW (ref 8.9–10.3)
Chloride: 95 mmol/L — ABNORMAL LOW (ref 98–111)
Creatinine, Ser: 4.71 mg/dL — ABNORMAL HIGH (ref 0.61–1.24)
GFR calc Af Amer: 14 mL/min — ABNORMAL LOW (ref >60)
GFR calc non Af Amer: 12 mL/min — ABNORMAL LOW (ref >60)
Glucose, Bld: 184 mg/dL — ABNORMAL HIGH (ref 70–99)
Phosphorus: 4.5 mg/dL (ref 2.5–4.6)
Potassium: 4.7 mmol/L (ref 3.5–5.1)
Sodium: 130 mmol/L — ABNORMAL LOW (ref 135–145)

## 2020-01-11 LAB — CBC
HCT: 26.3 % — ABNORMAL LOW (ref 39.0–52.0)
Hemoglobin: 7.9 g/dL — ABNORMAL LOW (ref 13.0–17.0)
MCH: 31 pg (ref 26.0–34.0)
MCHC: 30 g/dL (ref 30.0–36.0)
MCV: 103.1 fL — ABNORMAL HIGH (ref 80.0–100.0)
Platelets: 97 10*3/uL — ABNORMAL LOW (ref 150–400)
RBC: 2.55 MIL/uL — ABNORMAL LOW (ref 4.22–5.81)
RDW: 24.8 % — ABNORMAL HIGH (ref 11.5–15.5)
WBC: 15.8 10*3/uL — ABNORMAL HIGH (ref 4.0–10.5)
nRBC: 0.2 % (ref 0.0–0.2)

## 2020-01-11 LAB — CULTURE, RESPIRATORY W GRAM STAIN

## 2020-01-11 LAB — CARBAPENEM RESISTANCE PANEL
Carba Resistance IMP Gene: NOT DETECTED
Carba Resistance KPC Gene: DETECTED — AB
Carba Resistance NDM Gene: NOT DETECTED
Carba Resistance OXA48 Gene: NOT DETECTED
Carba Resistance VIM Gene: NOT DETECTED

## 2020-01-11 NOTE — Progress Notes (Signed)
Pulmonary Critical Care Medicine [place]   PULMONARY CRITICAL CARE SERVICE  PROGRESS NOTE  Date of Service: 01/11/2020  Todrick Siedschlag  KKX:[id number]  DOB: [birth year]   DOA: 12/14/2019  Referring Physician: Merton Border, MD  HPI: Umar Patmon is a 69 y.o. male seen for follow up of Acute on Chronic Respiratory Failure.  Patient currently is on pressure support has been on 40% FiO2 12/5 with a goal of up to 12 hours today  Medications: Reviewed on Rounds  Physical Exam:  Vitals: Temperature 96.0 pulse 99 respiratory rate 16 blood pressure is 130/67 saturations 98%  Ventilator Settings on pressure support FiO2 is 40% pressure poor 12/5  . General: Comfortable at this time . Eyes: Grossly normal lids, irises & conjunctiva . ENT: grossly tongue is normal . Neck: no obvious mass . Cardiovascular: S1 S2 normal no gallop . Respiratory: No rhonchi coarse breath sounds . Abdomen: soft . Skin: no rash seen on limited exam . Musculoskeletal: not rigid . Psychiatric:unable to assess . Neurologic: no seizure no involuntary movements         Lab Data:   Basic Metabolic Panel: Recent Labs  Lab 01/05/20 0438 01/07/20 0631 01/10/20 1538 01/11/20 0452  NA 129* 131* 133* 130*  K 4.5 4.5 6.2* 4.7  CL 92* 94* 97* 95*  CO2 24 23 18* 19*  GLUCOSE 268* 234* 213* 184*  BUN 86* 88* 141* 140*  CREATININE 4.27* 3.90* 4.56* 4.71*  CALCIUM 8.8* 8.4* 9.0 8.6*  MG 2.2 1.8  --   --   PHOS 2.4* 2.7 4.5 4.5    ABG: No results for input(s): PHART, PCO2ART, PO2ART, HCO3, O2SAT in the last 168 hours.  Liver Function Tests: Recent Labs  Lab 01/05/20 0438 01/07/20 0631 01/10/20 1538 01/11/20 0452  ALBUMIN 2.0* 2.0* 2.2* 2.1*   No results for input(s): LIPASE, AMYLASE in the last 168 hours. No results for input(s): AMMONIA in the last 168 hours.  CBC: Recent Labs  Lab 01/07/20 0631 01/08/20 0659 01/09/20 0536 01/10/20 1538 01/11/20 0452  WBC 23.1* 21.2* 19.6*  17.9* 15.8*  HGB 6.8* 9.2* 9.0* 8.6* 7.9*  HCT 22.1* 29.6* 29.0* 27.5* 26.3*  MCV 97.4 96.4 99.7 97.2 103.1*  PLT 313 284 186 129* 97*    Cardiac Enzymes: No results for input(s): CKTOTAL, CKMB, CKMBINDEX, TROPONINI in the last 168 hours.  BNP (last 3 results) No results for input(s): BNP in the last 8760 hours.  ProBNP (last 3 results) No results for input(s): PROBNP in the last 8760 hours.  Radiological Exams: No results found.  Assessment/Plan Active Problems:   Acute on chronic respiratory failure with hypoxia (HCC)   Chronic systolic (congestive) heart failure (HCC)   End stage renal disease on dialysis [facility])   Chronic atrial fibrillation (HCC)   Bilateral pleural effusion   1. Acute on chronic respiratory failure with hypoxia we will continue with pressure support wean as tolerated titrate oxygen continue pulmonary toilet 2. Chronic systolic heart failure patient is at baseline 3. End-stage renal disease on hemodialysis 4. Chronic atrial fibrillation rate is controlled 5. Bilateral pleural effusions clinically stable   I have personally seen and evaluated the patient, evaluated laboratory and imaging results, formulated the assessment and plan and placed orders. The Patient requires high complexity decision making with multiple systems involvement.  Rounds were done with the Respiratory Therapy Director and Staff therapists and discussed with nursing staff also.  Allyne Gee, MD [facility] Pulmonary Critical Care Medicine Sleep Medicine

## 2020-01-12 ENCOUNTER — Other Ambulatory Visit (HOSPITAL_COMMUNITY): Payer: Medicare Other

## 2020-01-12 DIAGNOSIS — I5022 Chronic systolic (congestive) heart failure: Secondary | ICD-10-CM | POA: Diagnosis not present

## 2020-01-12 DIAGNOSIS — J9621 Acute and chronic respiratory failure with hypoxia: Secondary | ICD-10-CM | POA: Diagnosis not present

## 2020-01-12 DIAGNOSIS — J90 Pleural effusion, not elsewhere classified: Secondary | ICD-10-CM | POA: Diagnosis not present

## 2020-01-12 DIAGNOSIS — I482 Chronic atrial fibrillation, unspecified: Secondary | ICD-10-CM | POA: Diagnosis not present

## 2020-01-12 NOTE — Progress Notes (Addendum)
Pulmonary Critical Care Medicine [place]   PULMONARY CRITICAL CARE SERVICE  PROGRESS NOTE  Date of Service: 01/12/2020  Guenther Dunshee  WLN:[id number]  DOB: Sep 20, 1951   DOA: 01/01/2020  Referring Physician: Merton Border, MD  HPI: Tramell Piechota is a 69 y.o. male seen for follow up of Acute on Chronic Respiratory Failure.  Patient has an 8-hour goal today on pressure support 12/5 with an FiO2 of 45% currently satting well no fever or distress.  Medications: Reviewed on Rounds  Physical Exam:  Vitals: Pulse 88 respirations 17 BP 109/55 O2 sat 99% temp 96.8  Ventilator Settings pressure support 12/5 FiO2 45%  . General: Comfortable at this time . Eyes: Grossly normal lids, irises & conjunctiva . ENT: grossly tongue is normal . Neck: no obvious mass . Cardiovascular: S1 S2 normal no gallop . Respiratory: Coarse breath sounds . Abdomen: soft . Skin: no rash seen on limited exam . Musculoskeletal: not rigid . Psychiatric:unable to assess . Neurologic: no seizure no involuntary movements         Lab Data:   Basic Metabolic Panel: Recent Labs  Lab 01/07/20 0631 01/10/20 1538 01/11/20 0452  NA 131* 133* 130*  K 4.5 6.2* 4.7  CL 94* 97* 95*  CO2 23 18* 19*  GLUCOSE 234* 213* 184*  BUN 88* 141* 140*  CREATININE 3.90* 4.56* 4.71*  CALCIUM 8.4* 9.0 8.6*  MG 1.8  --   --   PHOS 2.7 4.5 4.5    ABG: No results for input(s): PHART, PCO2ART, PO2ART, HCO3, O2SAT in the last 168 hours.  Liver Function Tests: Recent Labs  Lab 01/07/20 0631 01/10/20 1538 01/11/20 0452  ALBUMIN 2.0* 2.2* 2.1*   No results for input(s): LIPASE, AMYLASE in the last 168 hours. No results for input(s): AMMONIA in the last 168 hours.  CBC: Recent Labs  Lab 01/07/20 0631 01/08/20 0659 01/09/20 0536 01/10/20 1538 01/11/20 0452  WBC 23.1* 21.2* 19.6* 17.9* 15.8*  HGB 6.8* 9.2* 9.0* 8.6* 7.9*  HCT 22.1* 29.6* 29.0* 27.5* 26.3*  MCV 97.4 96.4 99.7 97.2 103.1*  PLT 313  284 186 129* 97*    Cardiac Enzymes: No results for input(s): CKTOTAL, CKMB, CKMBINDEX, TROPONINI in the last 168 hours.  BNP (last 3 results) No results for input(s): BNP in the last 8760 hours.  ProBNP (last 3 results) No results for input(s): PROBNP in the last 8760 hours.  Radiological Exams: DG CHEST PORT 1 VIEW  Result Date: 01/12/2020 CLINICAL DATA:  Pneumonia. EXAM: PORTABLE CHEST 1 VIEW COMPARISON:  12/28/2019 FINDINGS: The tracheostomy tube is stable. Right IJ central venous catheter is stable. Left IJ central venous catheter is stable. Stable cardiac enlargement, diffuse interstitial and airspace process and bilateral pleural effusions. IMPRESSION: Stable support apparatus. Stable cardiac enlargement, diffuse interstitial and airspace process and bilateral pleural effusions. Electronically Signed   By: Marijo Sanes M.D.   On: 01/12/2020 07:21    Assessment/Plan Active Problems:   Acute on chronic respiratory failure with hypoxia (HCC)   Chronic systolic (congestive) heart failure (HCC)   End stage renal disease on dialysis [facility])   Chronic atrial fibrillation (HCC)   Bilateral pleural effusion   1. Acute on chronic respiratory failure with hypoxia we will continue with pressure support wean as tolerated titrate oxygen continue pulmonary toilet 2. Chronic systolic heart failure patient is at baseline 3. End-stage renal disease on hemodialysis 4. Chronic atrial fibrillation rate is controlled 5. Bilateral pleural effusions clinically stable   I have  personally seen and evaluated the patient, evaluated laboratory and imaging results, formulated the assessment and plan and placed orders. The Patient requires high complexity decision making with multiple systems involvement.  Rounds were done with the Respiratory Therapy Director and Staff therapists and discussed with nursing staff also.  Allyne Gee, MD [facility] Pulmonary Critical Care Medicine Sleep Medicine

## 2020-01-12 NOTE — Progress Notes (Signed)
[facility]  ROUNDING NOTE   Subjective:  Patient remains critically ill. Still on pressors. Dialysis nursing shortage still ongoing therefore dialysis will be performed tomorrow.  Objective:  Vital signs in last 24 hours:  Temperature 96.8 pulse 88 respiration 17 blood pressure 109/55  Physical Exam: General: Critically ill-appearing  Head: Normocephalic, atraumatic. Moist oral mucosal membranes  Eyes: Anicteric  Neck: Tracheostomy in place  Lungs:  Scattered rhonchi bilateral, vent assisted  Heart: S1S2 irregular  Abdomen:  Soft, nontender, bowel sounds present  Extremities: 1+ peripheral edema.  Neurologic: Awake, alert, following commands  Skin: Bilateral upper extremity ecchymoses  Access: Right IJ PermCath    Basic Metabolic Panel: Recent Labs  Lab 01/07/20 0631 01/10/20 1538 01/11/20 0452  NA 131* 133* 130*  K 4.5 6.2* 4.7  CL 94* 97* 95*  CO2 23 18* 19*  GLUCOSE 234* 213* 184*  BUN 88* 141* 140*  CREATININE 3.90* 4.56* 4.71*  CALCIUM 8.4* 9.0 8.6*  MG 1.8  --   --   PHOS 2.7 4.5 4.5    Liver Function Tests: Recent Labs  Lab 01/07/20 0631 01/10/20 1538 01/11/20 0452  ALBUMIN 2.0* 2.2* 2.1*   No results for input(s): LIPASE, AMYLASE in the last 168 hours. No results for input(s): AMMONIA in the last 168 hours.  CBC: Recent Labs  Lab 01/07/20 0631 01/08/20 0659 01/09/20 0536 01/10/20 1538 01/11/20 0452  WBC 23.1* 21.2* 19.6* 17.9* 15.8*  HGB 6.8* 9.2* 9.0* 8.6* 7.9*  HCT 22.1* 29.6* 29.0* 27.5* 26.3*  MCV 97.4 96.4 99.7 97.2 103.1*  PLT 313 284 186 129* 97*    Cardiac Enzymes: No results for input(s): CKTOTAL, CKMB, CKMBINDEX, TROPONINI in the last 168 hours.  BNP: Invalid input(s): POCBNP  CBG: No results for input(s): GLUCAP in the last 168 hours.  Microbiology: Results for orders placed or performed during the hospital encounter of 12/18/2019  C difficile quick scan w PCR reflex     Status: None   Collection Time:  12/18/19  4:49 AM   Specimen: STOOL  Result Value Ref Range Status   C Diff antigen NEGATIVE NEGATIVE Final   C Diff toxin NEGATIVE NEGATIVE Final   C Diff interpretation No C. difficile detected.  Corrected    Comment: Performed at [facility], [place] [address] [place], [place] [address] CORRECTED ON 03/13 AT 1510: PREVIOUSLY REPORTED AS VALID   Culture, respiratory (non-expectorated)     Status: None   Collection Time: 12/24/19  4:00 PM   Specimen: Tracheal Aspirate; Respiratory  Result Value Ref Range Status   Specimen Description TRACHEAL ASPIRATE  Final   Special Requests NONE  Final   Gram Stain   Final    ABUNDANT WBC PRESENT, PREDOMINANTLY PMN ABUNDANT GRAM POSITIVE RODS FEW GRAM NEGATIVE RODS Performed at [facility], [place] [address] [place], [place] [address]    Culture MODERATE PROTEUS MIRABILIS  Final   Report Status 12/26/2019 FINAL  Final   Organism ID, Bacteria PROTEUS MIRABILIS  Final      Susceptibility   Proteus mirabilis - MIC*    AMPICILLIN <=2 SENSITIVE Sensitive     CEFAZOLIN 8 SENSITIVE Sensitive     CEFEPIME <=0.12 SENSITIVE Sensitive     CEFTAZIDIME <=1 SENSITIVE Sensitive     CEFTRIAXONE <=0.25 SENSITIVE Sensitive     CIPROFLOXACIN <=0.25 SENSITIVE Sensitive     GENTAMICIN <=1 SENSITIVE Sensitive     IMIPENEM 4 SENSITIVE Sensitive     TRIMETH/SULFA <=20  SENSITIVE Sensitive     AMPICILLIN/SULBACTAM <=2 SENSITIVE Sensitive     PIP/TAZO <=4 SENSITIVE Sensitive     * MODERATE PROTEUS MIRABILIS  Culture, blood (routine x 2)     Status: None   Collection Time: 12/25/19  5:16 PM   Specimen: BLOOD  Result Value Ref Range Status   Specimen Description BLOOD RIGHT ANTECUBITAL  Final   Special Requests   Final    BOTTLES DRAWN AEROBIC AND ANAEROBIC Blood Culture adequate volume   Culture   Final    NO GROWTH 5 DAYS Performed at [facility], [address], [place], [place] [address]    Report Status 12/30/2019 FINAL  Final  Culture,  blood (routine x 2)     Status: None   Collection Time: 12/25/19  5:18 PM   Specimen: BLOOD  Result Value Ref Range Status   Specimen Description BLOOD RIGHT ANTECUBITAL  Final   Special Requests   Final    BOTTLES DRAWN AEROBIC AND ANAEROBIC Blood Culture adequate volume   Culture   Final    NO GROWTH 5 DAYS Performed at [facility], [place] [address], [place], [place] [address]    Report Status 12/30/2019 FINAL  Final  C difficile quick scan w PCR reflex     Status: Abnormal   Collection Time: 12/26/19 12:11 PM   Specimen: STOOL  Result Value Ref Range Status   C Diff antigen POSITIVE (A) NEGATIVE Final   C Diff toxin POSITIVE (A) NEGATIVE Final    Comment: CRITICAL RESULT CALLED TO, READ BACK BY AND VERIFIED WITH:  RN CHRISSY R. @1817  12/26/2019 AKT    C Diff interpretation Toxin producing C. difficile detected.  Final  Culture, blood (routine x 2)     Status: None   Collection Time: 01/05/20 12:43 PM   Specimen: BLOOD  Result Value Ref Range Status   Specimen Description BLOOD RIGHT ANTECUBITAL  Final   Special Requests   Final    BOTTLES DRAWN AEROBIC AND ANAEROBIC Blood Culture adequate volume   Culture   Final    NO GROWTH 5 DAYS Performed at [facility], [address] [place], [place] [address]    Report Status 01/10/2020 FINAL  Final  Culture, respiratory (non-expectorated)     Status: None   Collection Time: 01/05/20  2:55 PM   Specimen: Tracheal Aspirate; Respiratory  Result Value Ref Range Status   Specimen Description TRACHEAL ASPIRATE  Final   Special Requests NONE  Final   Gram Stain   Final    ABUNDANT WBC PRESENT,BOTH PMN AND MONONUCLEAR ABUNDANT GRAM NEGATIVE RODS Performed at [facility], [place] [address], [place], [place] [address]    Culture   Final    ABUNDANT KLEBSIELLA PNEUMONIAE MODERATE PROTEUS MIRABILIS KLEBSIELLA PNEUMONIAE CONFIRMED CARBAPENEMASE RESISTANT ENTEROBACTERIACAE KLEBSIELLA PNEUMONIAE Confirmed Extended Spectrum  Beta-Lactamase Producer (ESBL).  In bloodstream infections from ESBL organisms, carbapenems are preferred over piperacillin/tazobactam. They are shown to have a lower risk of mortality. KLEBSIELLA PNEUMONIAE MULTI-DRUG RESISTANT ORGANISM    Report Status 01/11/2020 FINAL  Final   Organism ID, Bacteria PROTEUS MIRABILIS  Final   Organism ID, Bacteria KLEBSIELLA PNEUMONIAE  Final      Susceptibility   Klebsiella pneumoniae - MIC*    AMPICILLIN >=32 RESISTANT Resistant     CEFAZOLIN >=64 RESISTANT Resistant     CEFEPIME >=32 RESISTANT Resistant     CEFTAZIDIME >=64 RESISTANT Resistant     CEFTRIAXONE >=64  RESISTANT Resistant     CIPROFLOXACIN <=0.25 SENSITIVE Sensitive     GENTAMICIN >=16 RESISTANT Resistant     IMIPENEM >=16 RESISTANT Resistant     TRIMETH/SULFA >=320 RESISTANT Resistant     AMPICILLIN/SULBACTAM >=32 RESISTANT Resistant     PIP/TAZO >=128 RESISTANT Resistant     * ABUNDANT KLEBSIELLA PNEUMONIAE   Proteus mirabilis - MIC*    AMPICILLIN <=2 SENSITIVE Sensitive     CEFAZOLIN <=4 SENSITIVE Sensitive     CEFEPIME <=0.12 SENSITIVE Sensitive     CEFTAZIDIME <=1 SENSITIVE Sensitive     CEFTRIAXONE <=0.25 SENSITIVE Sensitive     CIPROFLOXACIN <=0.25 SENSITIVE Sensitive     GENTAMICIN <=1 SENSITIVE Sensitive     IMIPENEM 8 INTERMEDIATE Intermediate     TRIMETH/SULFA <=20 SENSITIVE Sensitive     AMPICILLIN/SULBACTAM <=2 SENSITIVE Sensitive     PIP/TAZO <=4 SENSITIVE Sensitive     * MODERATE PROTEUS MIRABILIS  Culture, blood (routine x 2)     Status: None   Collection Time: 01/05/20  3:04 PM   Specimen: BLOOD  Result Value Ref Range Status   Specimen Description BLOOD RIGHT ANTECUBITAL  Final   Special Requests   Final    BOTTLES DRAWN AEROBIC ONLY Blood Culture adequate volume   Culture   Final    NO GROWTH 5 DAYS Performed at [facility], [address], [place], [place] [address]    Report Status 01/10/2020 FINAL  Final  Carbapenem Resistance Panel      Status: Abnormal   Collection Time: 01/05/20  3:08 PM  Result Value Ref Range Status   Carba Resistance IMP Gene NOT DETECTED NOT DETECTED Final   Carba Resistance VIM Gene NOT DETECTED NOT DETECTED Final   Carba Resistance NDM Gene NOT DETECTED NOT DETECTED Final   Carba Resistance KPC Gene DETECTED (A) NOT DETECTED Final    Comment: CRITICAL RESULT CALLED TO, READ BACK BY AND VERIFIED WITH: RBV RN D. Fara Olden [phone] [id number] FCP    Carba Resistance OXA48 Gene NOT DETECTED NOT DETECTED Final    Comment: (NOTE) Cepheid Carba-R is an FDA-cleared nucleic acid amplification test  (NAAT)for the detection and differentiation of genes encoding the  most prevalent carbapenemases in bacterial isolate samples. Carbapenemase gene identification and implementation of comprehensive  infection control measures are recommended by the CDC to prevent the  spread of the resistant organisms. Performed at [facility], [place] [address] [place], [place] [address]     Coagulation Studies: No results for input(s): LABPROT, INR in the last 72 hours.  Urinalysis: No results for input(s): COLORURINE, LABSPEC, PHURINE, GLUCOSEU, HGBUR, BILIRUBINUR, KETONESUR, PROTEINUR, UROBILINOGEN, NITRITE, LEUKOCYTESUR in the last 72 hours.  Invalid input(s): APPERANCEUR    Imaging: DG CHEST PORT 1 VIEW  Result Date: 01/12/2020 CLINICAL DATA:  Pneumonia. EXAM: PORTABLE CHEST 1 VIEW COMPARISON:  12/28/2019 FINDINGS: The tracheostomy tube is stable. Right IJ central venous catheter is stable. Left IJ central venous catheter is stable. Stable cardiac enlargement, diffuse interstitial and airspace process and bilateral pleural effusions. IMPRESSION: Stable support apparatus. Stable cardiac enlargement, diffuse interstitial and airspace process and bilateral pleural effusions. Electronically Signed   By: Marijo Sanes M.D.   On: 01/12/2020 07:21     Medications:     iohexol  Assessment/ Plan:  69 y.o. male with a  PMHx of ESRD on HD, CHF, aortic stenosis, hypertension, diabetes mellitus type 2, hyperlipidemia, obstructive sleep apnea, chronic venous stasis with LE edema, and obesity who was  admitted to [facility] on 12/08/2019 for ongoing treatment of respiratory failure, ESRD, malnutrition, and generalized debility.    1.  ESRD on HD MWF.    Acute inpatient dialysis schedule remains quite busy.  Therefore acute dialysis nurse shortage noted at the moment.  Therefore dialysis to be performed tomorrow and hopefully patient will be back on schedule on Friday.  2.  Acute respiratory failure.    Patient continues on the ventilator.  He has been difficult to wean.  Weaning protocol per pulmonary/critical care.  3.  Secondary hyperparathyroidism.    Phosphorus currently 4.5.  Within acceptable range.  4.  Anemia of chronic kidney disease.  Hemoglobin continues to drift down.  On Protonix drip.  Hemoglobin now 7.9.  Consider transfusion again..    LOS: 0 Aneyah Lortz 4/7/202111:04 AM

## 2020-01-13 DIAGNOSIS — I482 Chronic atrial fibrillation, unspecified: Secondary | ICD-10-CM | POA: Diagnosis not present

## 2020-01-13 DIAGNOSIS — J9621 Acute and chronic respiratory failure with hypoxia: Secondary | ICD-10-CM | POA: Diagnosis not present

## 2020-01-13 DIAGNOSIS — J90 Pleural effusion, not elsewhere classified: Secondary | ICD-10-CM | POA: Diagnosis not present

## 2020-01-13 DIAGNOSIS — I5022 Chronic systolic (congestive) heart failure: Secondary | ICD-10-CM | POA: Diagnosis not present

## 2020-01-13 LAB — CBC
HCT: 24.2 % — ABNORMAL LOW (ref 39.0–52.0)
Hemoglobin: 7.4 g/dL — ABNORMAL LOW (ref 13.0–17.0)
MCH: 31.4 pg (ref 26.0–34.0)
MCHC: 30.6 g/dL (ref 30.0–36.0)
MCV: 102.5 fL — ABNORMAL HIGH (ref 80.0–100.0)
Platelets: 96 10*3/uL — ABNORMAL LOW (ref 150–400)
RBC: 2.36 MIL/uL — ABNORMAL LOW (ref 4.22–5.81)
RDW: 24.5 % — ABNORMAL HIGH (ref 11.5–15.5)
WBC: 17.5 10*3/uL — ABNORMAL HIGH (ref 4.0–10.5)
nRBC: 0 % (ref 0.0–0.2)

## 2020-01-13 LAB — RENAL FUNCTION PANEL
Albumin: 2.1 g/dL — ABNORMAL LOW (ref 3.5–5.0)
Anion gap: 14 (ref 5–15)
BUN: 107 mg/dL — ABNORMAL HIGH (ref 8–23)
CO2: 23 mmol/L (ref 22–32)
Calcium: 8.6 mg/dL — ABNORMAL LOW (ref 8.9–10.3)
Chloride: 95 mmol/L — ABNORMAL LOW (ref 98–111)
Creatinine, Ser: 4.03 mg/dL — ABNORMAL HIGH (ref 0.61–1.24)
GFR calc Af Amer: 17 mL/min — ABNORMAL LOW (ref >60)
GFR calc non Af Amer: 14 mL/min — ABNORMAL LOW (ref >60)
Glucose, Bld: 145 mg/dL — ABNORMAL HIGH (ref 70–99)
Phosphorus: 4.3 mg/dL (ref 2.5–4.6)
Potassium: 4 mmol/L (ref 3.5–5.1)
Sodium: 132 mmol/L — ABNORMAL LOW (ref 135–145)

## 2020-01-13 NOTE — Progress Notes (Signed)
Pulmonary [place]   PULMONARY CRITICAL CARE SERVICE  PROGRESS NOTE  Date of Service: 01/13/2020  Dakhari Zuver  JEH:[id number]  DOB: 1951/04/04   DOA: 01/03/2020  Referring Physician: Merton Border, MD  HPI: Jose Tucker is a 69 y.o. male seen for follow up of Acute on Chronic Respiratory Failure.  Patient is on pressure support 12/5 did 8 hours yesterday of pressure support weaning  Medications: Reviewed on Rounds  Physical Exam:  Vitals: Temperature 96.2 pulse 92 respiratory rate 17 blood pressure is 112/56 saturations 100%  Ventilator Settings mode ventilation pressure support FiO2 45% tidal volume is 514 pressure support 12 PEEP 5  . General: Comfortable at this time . Eyes: Grossly normal lids, irises & conjunctiva . ENT: grossly tongue is normal . Neck: no obvious mass . Cardiovascular: S1 S2 normal no gallop . Respiratory: No rhonchi no rales are noted at this time . Abdomen: soft . Skin: no rash seen on limited exam . Musculoskeletal: not rigid . Psychiatric:unable to assess . Neurologic: no seizure no involuntary movements         Lab Data:   Basic Metabolic Panel: Recent Labs  Lab 01/07/20 0631 01/10/20 1538 01/11/20 0452 01/13/20 0552  NA 131* 133* 130* 132*  K 4.5 6.2* 4.7 4.0  CL 94* 97* 95* 95*  CO2 23 18* 19* 23  GLUCOSE 234* 213* 184* 145*  BUN 88* 141* 140* 107*  CREATININE 3.90* 4.56* 4.71* 4.03*  CALCIUM 8.4* 9.0 8.6* 8.6*  MG 1.8  --   --   --   PHOS 2.7 4.5 4.5 4.3    ABG: No results for input(s): PHART, PCO2ART, PO2ART, HCO3, O2SAT in the last 168 hours.  Liver Function Tests: Recent Labs  Lab 01/07/20 0631 01/10/20 1538 01/11/20 0452 01/13/20 0552  ALBUMIN 2.0* 2.2* 2.1* 2.1*   No results for input(s): LIPASE, AMYLASE in the last 168 hours. No results for input(s): AMMONIA in the last 168 hours.  CBC: Recent Labs  Lab 01/08/20 0659 01/09/20 0536 01/10/20 1538 01/11/20 0452  01/13/20 0552  WBC 21.2* 19.6* 17.9* 15.8* 17.5*  HGB 9.2* 9.0* 8.6* 7.9* 7.4*  HCT 29.6* 29.0* 27.5* 26.3* 24.2*  MCV 96.4 99.7 97.2 103.1* 102.5*  PLT 284 186 129* 97* 96*    Cardiac Enzymes: No results for input(s): CKTOTAL, CKMB, CKMBINDEX, TROPONINI in the last 168 hours.  BNP (last 3 results) No results for input(s): BNP in the last 8760 hours.  ProBNP (last 3 results) No results for input(s): PROBNP in the last 8760 hours.  Radiological Exams: DG CHEST PORT 1 VIEW  Result Date: 01/12/2020 CLINICAL DATA:  Pneumonia. EXAM: PORTABLE CHEST 1 VIEW COMPARISON:  12/28/2019 FINDINGS: The tracheostomy tube is stable. Right IJ central venous catheter is stable. Left IJ central venous catheter is stable. Stable cardiac enlargement, diffuse interstitial and airspace process and bilateral pleural effusions. IMPRESSION: Stable support apparatus. Stable cardiac enlargement, diffuse interstitial and airspace process and bilateral pleural effusions. Electronically Signed   By: Marijo Sanes M.D.   On: 01/12/2020 07:21    Assessment/Plan Active Problems:   Acute on chronic respiratory failure with hypoxia (HCC)   Chronic systolic (congestive) heart failure (HCC)   End stage renal disease on dialysis [facility])   Chronic atrial fibrillation (HCC)   Bilateral pleural effusion   1. Acute on chronic respiratory failure with hypoxia plan is to continue with the weaning on pressure support patient did 8 hours as indicated above yesterday.  Plan is going to be to continue to build on that. 2. Chronic systolic heart failure chest x-ray showing interstitial airspace disease with effusions likely fluid overload patient is being dialyzed 3. End-stage renal disease on hemodialysis continue with fluid removal as tolerated 4. Chronic atrial fibrillation rate controlled 5. Bilateral effusions likely underlying congestive heart failure we will continue with supportive care prognosis remains guarded   I have  personally seen and evaluated the patient, evaluated laboratory and imaging results, formulated the assessment and plan and placed orders. The Patient requires high complexity decision making with multiple systems involvement.  Rounds were done with the Respiratory Therapy Director and Staff therapists and discussed with nursing staff also.  Allyne Gee, MD [facility] Pulmonary Critical Care Medicine Sleep Medicine

## 2020-01-14 ENCOUNTER — Other Ambulatory Visit (HOSPITAL_COMMUNITY): Payer: Medicare Other

## 2020-01-14 DIAGNOSIS — J90 Pleural effusion, not elsewhere classified: Secondary | ICD-10-CM | POA: Diagnosis not present

## 2020-01-14 DIAGNOSIS — J9621 Acute and chronic respiratory failure with hypoxia: Secondary | ICD-10-CM | POA: Diagnosis not present

## 2020-01-14 DIAGNOSIS — I482 Chronic atrial fibrillation, unspecified: Secondary | ICD-10-CM | POA: Diagnosis not present

## 2020-01-14 DIAGNOSIS — I5022 Chronic systolic (congestive) heart failure: Secondary | ICD-10-CM | POA: Diagnosis not present

## 2020-01-14 LAB — RENAL FUNCTION PANEL
Albumin: 2.2 g/dL — ABNORMAL LOW (ref 3.5–5.0)
Anion gap: 13 (ref 5–15)
BUN: 73 mg/dL — ABNORMAL HIGH (ref 8–23)
CO2: 25 mmol/L (ref 22–32)
Calcium: 8.4 mg/dL — ABNORMAL LOW (ref 8.9–10.3)
Chloride: 94 mmol/L — ABNORMAL LOW (ref 98–111)
Creatinine, Ser: 3.12 mg/dL — ABNORMAL HIGH (ref 0.61–1.24)
GFR calc Af Amer: 23 mL/min — ABNORMAL LOW (ref >60)
GFR calc non Af Amer: 19 mL/min — ABNORMAL LOW (ref >60)
Glucose, Bld: 172 mg/dL — ABNORMAL HIGH (ref 70–99)
Phosphorus: 3.5 mg/dL (ref 2.5–4.6)
Potassium: 3.4 mmol/L — ABNORMAL LOW (ref 3.5–5.1)
Sodium: 132 mmol/L — ABNORMAL LOW (ref 135–145)

## 2020-01-14 LAB — CBC
HCT: 23.4 % — ABNORMAL LOW (ref 39.0–52.0)
Hemoglobin: 7.1 g/dL — ABNORMAL LOW (ref 13.0–17.0)
MCH: 30.7 pg (ref 26.0–34.0)
MCHC: 30.3 g/dL (ref 30.0–36.0)
MCV: 101.3 fL — ABNORMAL HIGH (ref 80.0–100.0)
Platelets: 103 10*3/uL — ABNORMAL LOW (ref 150–400)
RBC: 2.31 MIL/uL — ABNORMAL LOW (ref 4.22–5.81)
RDW: 23.9 % — ABNORMAL HIGH (ref 11.5–15.5)
WBC: 13.9 10*3/uL — ABNORMAL HIGH (ref 4.0–10.5)
nRBC: 0 % (ref 0.0–0.2)

## 2020-01-14 LAB — PREPARE RBC (CROSSMATCH)

## 2020-01-14 NOTE — Progress Notes (Signed)
Pulmonary [place]   PULMONARY CRITICAL CARE SERVICE  PROGRESS NOTE  Date of Service: 01/14/2020  Taeshawn Helfman  LEX:[id number]  DOB: 1951/05/24   DOA: 12/29/2019  Referring Physician: Merton Border, MD  HPI: Eion Timbrook is a 69 y.o. male seen for follow up of Acute on Chronic Respiratory Failure. Patient is on pressure support 12/5 with a goal of 12 hours today.  Medications: Reviewed on Rounds  Physical Exam:  Vitals: Temperature is 99.0 pulse 91 respiratory 19 blood pressure is 138/60  Ventilator Settings on pressure support FiO2 45% right now per support 12/5  . General: Comfortable at this time . Eyes: Grossly normal lids, irises & conjunctiva . ENT: grossly tongue is normal . Neck: no obvious mass . Cardiovascular: S1 S2 normal no gallop . Respiratory: Scattered rhonchi expansion is equal . Abdomen: soft . Skin: no rash seen on limited exam . Musculoskeletal: not rigid . Psychiatric:unable to assess . Neurologic: no seizure no involuntary movements         Lab Data:   Basic Metabolic Panel: Recent Labs  Lab 01/10/20 1538 01/11/20 0452 01/13/20 0552 01/14/20 0503  NA 133* 130* 132* 132*  K 6.2* 4.7 4.0 3.4*  CL 97* 95* 95* 94*  CO2 18* 19* 23 25  GLUCOSE 213* 184* 145* 172*  BUN 141* 140* 107* 73*  CREATININE 4.56* 4.71* 4.03* 3.12*  CALCIUM 9.0 8.6* 8.6* 8.4*  PHOS 4.5 4.5 4.3 3.5    ABG: No results for input(s): PHART, PCO2ART, PO2ART, HCO3, O2SAT in the last 168 hours.  Liver Function Tests: Recent Labs  Lab 01/10/20 1538 01/11/20 0452 01/13/20 0552 01/14/20 0503  ALBUMIN 2.2* 2.1* 2.1* 2.2*   No results for input(s): LIPASE, AMYLASE in the last 168 hours. No results for input(s): AMMONIA in the last 168 hours.  CBC: Recent Labs  Lab 01/09/20 0536 01/10/20 1538 01/11/20 0452 01/13/20 0552 01/14/20 0458  WBC 19.6* 17.9* 15.8* 17.5* 13.9*  HGB 9.0* 8.6* 7.9* 7.4* 7.1*  HCT 29.0* 27.5* 26.3*  24.2* 23.4*  MCV 99.7 97.2 103.1* 102.5* 101.3*  PLT 186 129* 97* 96* 103*    Cardiac Enzymes: No results for input(s): CKTOTAL, CKMB, CKMBINDEX, TROPONINI in the last 168 hours.  BNP (last 3 results) No results for input(s): BNP in the last 8760 hours.  ProBNP (last 3 results) No results for input(s): PROBNP in the last 8760 hours.  Radiological Exams: No results found.  Assessment/Plan Active Problems:   Acute on chronic respiratory failure with hypoxia (HCC)   Chronic systolic (congestive) heart failure (HCC)   End stage renal disease on dialysis [facility])   Chronic atrial fibrillation (HCC)   Bilateral pleural effusion   1. Acute on chronic respiratory failure with hypoxia plan is to continue with weaning on pressure support goal today is 12 hours which will be met 2. Chronic systolic heart failure compensated at this time we will continue monitoring fluid status 3. End-stage renal disease on hemodialysis 4. Chronic atrial fibrillation rate controlled 5. Bilateral pleural effusion follow radiologically   I have personally seen and evaluated the patient, evaluated laboratory and imaging results, formulated the assessment and plan and placed orders. The Patient requires high complexity decision making with multiple systems involvement.  Rounds were done with the Respiratory Therapy Director and Staff therapists and discussed with nursing staff also.  Allyne Gee, MD [facility] Pulmonary Critical Care Medicine Sleep Medicine

## 2020-01-14 NOTE — Progress Notes (Signed)
[facility]  ROUNDING NOTE   Subjective:  Patient seen and evaluated at bedside. Still on pressors to maintain blood pressure. Completed dialysis yesterday.  Objective:  Vital signs in last 24 hours:  Temperature 95.4 pulse 93 respirations 19 blood pressure 138/61  Physical Exam: General: Critically ill-appearing  Head: Normocephalic, atraumatic. Moist oral mucosal membranes  Eyes: Anicteric  Neck: Tracheostomy in place  Lungs:  Scattered rhonchi bilateral, vent assisted  Heart: S1S2 irregular  Abdomen:  Soft, nontender, bowel sounds present  Extremities: trace peripheral edema.  Neurologic: Awake, alert, following commands  Skin: Bilateral upper extremity ecchymoses  Access: Right IJ PermCath    Basic Metabolic Panel: Recent Labs  Lab 01/10/20 1538 01/10/20 1538 01/11/20 0452 01/13/20 0552 01/14/20 0503  NA 133*  --  130* 132* 132*  K 6.2*  --  4.7 4.0 3.4*  CL 97*  --  95* 95* 94*  CO2 18*  --  19* 23 25  GLUCOSE 213*  --  184* 145* 172*  BUN 141*  --  140* 107* 73*  CREATININE 4.56*  --  4.71* 4.03* 3.12*  CALCIUM 9.0   < > 8.6* 8.6* 8.4*  PHOS 4.5  --  4.5 4.3 3.5   < > = values in this interval not displayed.    Liver Function Tests: Recent Labs  Lab 01/10/20 1538 01/11/20 0452 01/13/20 0552 01/14/20 0503  ALBUMIN 2.2* 2.1* 2.1* 2.2*   No results for input(s): LIPASE, AMYLASE in the last 168 hours. No results for input(s): AMMONIA in the last 168 hours.  CBC: Recent Labs  Lab 01/09/20 0536 01/10/20 1538 01/11/20 0452 01/13/20 0552 01/14/20 0458  WBC 19.6* 17.9* 15.8* 17.5* 13.9*  HGB 9.0* 8.6* 7.9* 7.4* 7.1*  HCT 29.0* 27.5* 26.3* 24.2* 23.4*  MCV 99.7 97.2 103.1* 102.5* 101.3*  PLT 186 129* 97* 96* 103*    Cardiac Enzymes: No results for input(s): CKTOTAL, CKMB, CKMBINDEX, TROPONINI in the last 168 hours.  BNP: Invalid input(s): POCBNP  CBG: No results for input(s): GLUCAP in the last 168  hours.  Microbiology: Results for orders placed or performed during the hospital encounter of 12/22/2019  C difficile quick scan w PCR reflex     Status: None   Collection Time: 12/18/19  4:49 AM   Specimen: STOOL  Result Value Ref Range Status   C Diff antigen NEGATIVE NEGATIVE Final   C Diff toxin NEGATIVE NEGATIVE Final   C Diff interpretation No C. difficile detected.  Corrected    Comment: Performed at [facility], [place] [address] [place], [place] [address] CORRECTED ON 03/13 AT 1510: PREVIOUSLY REPORTED AS VALID   Culture, respiratory (non-expectorated)     Status: None   Collection Time: 12/24/19  4:00 PM   Specimen: Tracheal Aspirate; Respiratory  Result Value Ref Range Status   Specimen Description TRACHEAL ASPIRATE  Final   Special Requests NONE  Final   Gram Stain   Final    ABUNDANT WBC PRESENT, PREDOMINANTLY PMN ABUNDANT GRAM POSITIVE RODS FEW GRAM NEGATIVE RODS Performed at [facility], [place] [address] [place], [place] [address]    Culture MODERATE PROTEUS MIRABILIS  Final   Report Status 12/26/2019 FINAL  Final   Organism ID, Bacteria PROTEUS MIRABILIS  Final      Susceptibility   Proteus mirabilis - MIC*    AMPICILLIN <=2 SENSITIVE Sensitive     CEFAZOLIN 8 SENSITIVE Sensitive     CEFEPIME <=0.12 SENSITIVE Sensitive  CEFTAZIDIME <=1 SENSITIVE Sensitive     CEFTRIAXONE <=0.25 SENSITIVE Sensitive     CIPROFLOXACIN <=0.25 SENSITIVE Sensitive     GENTAMICIN <=1 SENSITIVE Sensitive     IMIPENEM 4 SENSITIVE Sensitive     TRIMETH/SULFA <=20 SENSITIVE Sensitive     AMPICILLIN/SULBACTAM <=2 SENSITIVE Sensitive     PIP/TAZO <=4 SENSITIVE Sensitive     * MODERATE PROTEUS MIRABILIS  Culture, blood (routine x 2)     Status: None   Collection Time: 12/25/19  5:16 PM   Specimen: BLOOD  Result Value Ref Range Status   Specimen Description BLOOD RIGHT ANTECUBITAL  Final   Special Requests   Final    BOTTLES DRAWN AEROBIC AND ANAEROBIC Blood Culture  adequate volume   Culture   Final    NO GROWTH 5 DAYS Performed at [facility], [place] [address] [place], [place] [address]    Report Status 12/30/2019 FINAL  Final  Culture, blood (routine x 2)     Status: None   Collection Time: 12/25/19  5:18 PM   Specimen: BLOOD  Result Value Ref Range Status   Specimen Description BLOOD RIGHT ANTECUBITAL  Final   Special Requests   Final    BOTTLES DRAWN AEROBIC AND ANAEROBIC Blood Culture adequate volume   Culture   Final    NO GROWTH 5 DAYS Performed at [facility], [place] [address], [place] [address]    Report Status 12/30/2019 FINAL  Final  C difficile quick scan w PCR reflex     Status: Abnormal   Collection Time: 12/26/19 12:11 PM   Specimen: STOOL  Result Value Ref Range Status   C Diff antigen POSITIVE (A) NEGATIVE Final   C Diff toxin POSITIVE (A) NEGATIVE Final    Comment: CRITICAL RESULT CALLED TO, READ BACK BY AND VERIFIED WITH:  RN CHRISSY R. @[id number]  12/26/2019 AKT    C Diff interpretation Toxin producing C. difficile detected.  Final  Culture, blood (routine x 2)     Status: None   Collection Time: 01/05/20 12:43 PM   Specimen: BLOOD  Result Value Ref Range Status   Specimen Description BLOOD RIGHT ANTECUBITAL  Final   Special Requests   Final    BOTTLES DRAWN AEROBIC AND ANAEROBIC Blood Culture adequate volume   Culture   Final    NO GROWTH 5 DAYS Performed at [facility], [address] [place], [place] [address]    Report Status 01/10/2020 FINAL  Final  Culture, respiratory (non-expectorated)     Status: None   Collection Time: 01/05/20  2:55 PM   Specimen: Tracheal Aspirate; Respiratory  Result Value Ref Range Status   Specimen Description TRACHEAL ASPIRATE  Final   Special Requests NONE  Final   Gram Stain   Final    ABUNDANT WBC PRESENT,BOTH PMN AND MONONUCLEAR ABUNDANT GRAM NEGATIVE RODS Performed at [facility], [place] [address], [place], [place] [address]    Culture   Final     ABUNDANT KLEBSIELLA PNEUMONIAE MODERATE PROTEUS MIRABILIS KLEBSIELLA PNEUMONIAE CONFIRMED CARBAPENEMASE RESISTANT ENTEROBACTERIACAE KLEBSIELLA PNEUMONIAE Confirmed Extended Spectrum Beta-Lactamase Producer (ESBL).  In bloodstream infections from ESBL organisms, carbapenems are preferred over piperacillin/tazobactam. They are shown to have a lower risk of mortality. KLEBSIELLA PNEUMONIAE MULTI-DRUG RESISTANT ORGANISM    Report Status 01/11/2020 FINAL  Final   Organism ID, Bacteria PROTEUS MIRABILIS  Final   Organism ID, Bacteria KLEBSIELLA PNEUMONIAE  Final      Susceptibility  Klebsiella pneumoniae - MIC*    AMPICILLIN >=32 RESISTANT Resistant     CEFAZOLIN >=64 RESISTANT Resistant     CEFEPIME >=32 RESISTANT Resistant     CEFTAZIDIME >=64 RESISTANT Resistant     CEFTRIAXONE >=64 RESISTANT Resistant     CIPROFLOXACIN <=0.25 SENSITIVE Sensitive     GENTAMICIN >=16 RESISTANT Resistant     IMIPENEM >=16 RESISTANT Resistant     TRIMETH/SULFA >=320 RESISTANT Resistant     AMPICILLIN/SULBACTAM >=32 RESISTANT Resistant     PIP/TAZO >=128 RESISTANT Resistant     * ABUNDANT KLEBSIELLA PNEUMONIAE   Proteus mirabilis - MIC*    AMPICILLIN <=2 SENSITIVE Sensitive     CEFAZOLIN <=4 SENSITIVE Sensitive     CEFEPIME <=0.12 SENSITIVE Sensitive     CEFTAZIDIME <=1 SENSITIVE Sensitive     CEFTRIAXONE <=0.25 SENSITIVE Sensitive     CIPROFLOXACIN <=0.25 SENSITIVE Sensitive     GENTAMICIN <=1 SENSITIVE Sensitive     IMIPENEM 8 INTERMEDIATE Intermediate     TRIMETH/SULFA <=20 SENSITIVE Sensitive     AMPICILLIN/SULBACTAM <=2 SENSITIVE Sensitive     PIP/TAZO <=4 SENSITIVE Sensitive     * MODERATE PROTEUS MIRABILIS  Culture, blood (routine x 2)     Status: None   Collection Time: 01/05/20  3:04 PM   Specimen: BLOOD  Result Value Ref Range Status   Specimen Description BLOOD RIGHT ANTECUBITAL  Final   Special Requests   Final    BOTTLES DRAWN AEROBIC ONLY Blood Culture adequate volume   Culture    Final    NO GROWTH 5 DAYS Performed at [facility], [address], [place], [place] [address]    Report Status 01/10/2020 FINAL  Final  Carbapenem Resistance Panel     Status: Abnormal   Collection Time: 01/05/20  3:08 PM  Result Value Ref Range Status   Carba Resistance IMP Gene NOT DETECTED NOT DETECTED Final   Carba Resistance VIM Gene NOT DETECTED NOT DETECTED Final   Carba Resistance NDM Gene NOT DETECTED NOT DETECTED Final   Carba Resistance KPC Gene DETECTED (A) NOT DETECTED Final    Comment: CRITICAL RESULT CALLED TO, READ BACK BY AND VERIFIED WITH: RBV RN D. Fara Olden [id number] FCP    Carba Resistance OXA48 Gene NOT DETECTED NOT DETECTED Final    Comment: (NOTE) Cepheid Carba-R is an FDA-cleared nucleic acid amplification test  (NAAT)for the detection and differentiation of genes encoding the  most prevalent carbapenemases in bacterial isolate samples. Carbapenemase gene identification and implementation of comprehensive  infection control measures are recommended by the CDC to prevent the  spread of the resistant organisms. Performed at [facility], [place] [address] [place], [place] [address]     Coagulation Studies: No results for input(s): LABPROT, INR in the last 72 hours.  Urinalysis: No results for input(s): COLORURINE, LABSPEC, PHURINE, GLUCOSEU, HGBUR, BILIRUBINUR, KETONESUR, PROTEINUR, UROBILINOGEN, NITRITE, LEUKOCYTESUR in the last 72 hours.  Invalid input(s): APPERANCEUR    Imaging: No results found.   Medications:     iohexol  Assessment/ Plan:  69 y.o. male with a PMHx of ESRD on HD, CHF, aortic stenosis, hypertension, diabetes mellitus type 2, hyperlipidemia, obstructive sleep apnea, chronic venous stasis with LE edema, and obesity who was admitted to [facility] on 01/02/2020 for ongoing treatment of respiratory failure, ESRD, malnutrition, and generalized debility.    1.  ESRD on HD MWF.    Patient has been on dialysis TTS this week  due to dialysis nurse staffing  issues.  He had dialysis yesterday.  He will undergo dialysis again tomorrow.  Hopefully we can get him back on MWF schedule next week.  2.  Acute respiratory failure.    Patient remains ventilator dependent.  Pulmonary/critical care following.  3.  Secondary hyperparathyroidism.    Phosphorus currently acceptable at 3.5.  Continue to monitor.  4.  Anemia of chronic kidney disease.  Hemoglobin down to 7.1 today.  Consider transfusion but defer to primary team.    LOS: 0 Jamelia Varano 4/9/20218:27 AM

## 2020-01-15 DIAGNOSIS — I5022 Chronic systolic (congestive) heart failure: Secondary | ICD-10-CM | POA: Diagnosis not present

## 2020-01-15 DIAGNOSIS — J9621 Acute and chronic respiratory failure with hypoxia: Secondary | ICD-10-CM | POA: Diagnosis not present

## 2020-01-15 DIAGNOSIS — J90 Pleural effusion, not elsewhere classified: Secondary | ICD-10-CM | POA: Diagnosis not present

## 2020-01-15 DIAGNOSIS — I482 Chronic atrial fibrillation, unspecified: Secondary | ICD-10-CM | POA: Diagnosis not present

## 2020-01-15 LAB — POTASSIUM: Potassium: 3.8 mmol/L (ref 3.5–5.1)

## 2020-01-15 NOTE — Progress Notes (Signed)
Pulmonary [place]   PULMONARY CRITICAL CARE SERVICE  PROGRESS NOTE  Date of Service: 01/15/2020  Jose Tucker  OJJ:[id number]  DOB: [birth year]   DOA: 12/10/2019  Referring Physician: Merton Border, MD  HPI: Jose Tucker is a 69 y.o. male seen for follow up of Acute on Chronic Respiratory Failure.  Currently on full support on assist control mode with a goal of 16 hours of pressure support today to be started  Medications: Reviewed on Rounds  Physical Exam:  Vitals: Temperature is 95.8 pulse 86 respiratory rate 22 blood pressure is 127/68 saturations 97%  Ventilator Settings on assist control FiO2 45% tidal volumes 500 PEEP 5 pressure support 16  . General: Comfortable at this time . Eyes: Grossly normal lids, irises & conjunctiva . ENT: grossly tongue is normal . Neck: no obvious mass . Cardiovascular: S1 S2 normal no gallop . Respiratory: No rhonchi coarse breath sounds . Abdomen: soft . Skin: no rash seen on limited exam . Musculoskeletal: not rigid . Psychiatric:unable to assess . Neurologic: no seizure no involuntary movements         Lab Data:   Basic Metabolic Panel: Recent Labs  Lab 01/10/20 1538 01/11/20 0452 01/13/20 0552 01/14/20 0503 01/15/20 0521  NA 133* 130* 132* 132*  --   K 6.2* 4.7 4.0 3.4* 3.8  CL 97* 95* 95* 94*  --   CO2 18* 19* 23 25  --   GLUCOSE 213* 184* 145* 172*  --   BUN 141* 140* 107* 73*  --   CREATININE 4.56* 4.71* 4.03* 3.12*  --   CALCIUM 9.0 8.6* 8.6* 8.4*  --   PHOS 4.5 4.5 4.3 3.5  --     ABG: No results for input(s): PHART, PCO2ART, PO2ART, HCO3, O2SAT in the last 168 hours.  Liver Function Tests: Recent Labs  Lab 01/10/20 1538 01/11/20 0452 01/13/20 0552 01/14/20 0503  ALBUMIN 2.2* 2.1* 2.1* 2.2*   No results for input(s): LIPASE, AMYLASE in the last 168 hours. No results for input(s): AMMONIA in the last 168 hours.  CBC: Recent Labs  Lab 01/09/20 0536  01/10/20 1538 01/11/20 0452 01/13/20 0552 01/14/20 0458  WBC 19.6* 17.9* 15.8* 17.5* 13.9*  HGB 9.0* 8.6* 7.9* 7.4* 7.1*  HCT 29.0* 27.5* 26.3* 24.2* 23.4*  MCV 99.7 97.2 103.1* 102.5* 101.3*  PLT 186 129* 97* 96* 103*    Cardiac Enzymes: No results for input(s): CKTOTAL, CKMB, CKMBINDEX, TROPONINI in the last 168 hours.  BNP (last 3 results) No results for input(s): BNP in the last 8760 hours.  ProBNP (last 3 results) No results for input(s): PROBNP in the last 8760 hours.  Radiological Exams: No results found.  Assessment/Plan Active Problems:   Acute on chronic respiratory failure with hypoxia (HCC)   Chronic systolic (congestive) heart failure (HCC)   End stage renal disease on dialysis [facility])   Chronic atrial fibrillation (HCC)   Bilateral pleural effusion   1. Acute on chronic respiratory failure hypoxia plan is to continue with assist control mode titrate oxygen continue pulmonary toilet 2. Chronic systolic heart failure compensated we will continue with supportive care 3. End-stage renal failure on hemodialysis 4. Chronic atrial fibrillation rate controlled 5. Pleural effusions following radiologically   I have personally seen and evaluated the patient, evaluated laboratory and imaging results, formulated the assessment and plan and placed orders. The Patient requires high complexity decision making with multiple systems involvement.  Rounds were done with the Respiratory Therapy Director  and Staff therapists and discussed with nursing staff also.  Allyne Gee, MD [facility] Pulmonary Critical Care Medicine Sleep Medicine

## 2020-01-16 DIAGNOSIS — J9621 Acute and chronic respiratory failure with hypoxia: Secondary | ICD-10-CM | POA: Diagnosis not present

## 2020-01-16 DIAGNOSIS — I482 Chronic atrial fibrillation, unspecified: Secondary | ICD-10-CM | POA: Diagnosis not present

## 2020-01-16 DIAGNOSIS — J90 Pleural effusion, not elsewhere classified: Secondary | ICD-10-CM | POA: Diagnosis not present

## 2020-01-16 DIAGNOSIS — I5022 Chronic systolic (congestive) heart failure: Secondary | ICD-10-CM | POA: Diagnosis not present

## 2020-01-16 LAB — CBC
HCT: 26.2 % — ABNORMAL LOW (ref 39.0–52.0)
Hemoglobin: 7.9 g/dL — ABNORMAL LOW (ref 13.0–17.0)
MCH: 30.4 pg (ref 26.0–34.0)
MCHC: 30.2 g/dL (ref 30.0–36.0)
MCV: 100.8 fL — ABNORMAL HIGH (ref 80.0–100.0)
Platelets: 124 10*3/uL — ABNORMAL LOW (ref 150–400)
RBC: 2.6 MIL/uL — ABNORMAL LOW (ref 4.22–5.81)
RDW: 23.1 % — ABNORMAL HIGH (ref 11.5–15.5)
WBC: 9.3 10*3/uL (ref 4.0–10.5)
nRBC: 0 % (ref 0.0–0.2)

## 2020-01-16 LAB — TYPE AND SCREEN
ABO/RH(D): A POS
Antibody Screen: NEGATIVE
Unit division: 0

## 2020-01-16 LAB — BPAM RBC
Blood Product Expiration Date: 202105072359
ISSUE DATE / TIME: 202104101007
Unit Type and Rh: 6200

## 2020-01-16 NOTE — Progress Notes (Signed)
Pulmonary [place]   PULMONARY CRITICAL CARE SERVICE  PROGRESS NOTE  Date of Service: 01/16/2020  Jose Tucker  JXB:[id number]  DOB: 10/13/1950   DOA: 12/19/2019  Referring Physician: Merton Border, MD  HPI: Jose Tucker is a 69 y.o. male seen for follow up of Acute on Chronic Respiratory Failure.  Patient currently is on assist control FiO2 45%  Medications: Reviewed on Rounds  Physical Exam:  Vitals: Temperature is 96.5 pulse 92 respiratory 25 blood pressure is 126/60 saturations 100%  Ventilator Settings on assist control FiO2 45% tidal line 500 PEEP 5  . General: Comfortable at this time . Eyes: Grossly normal lids, irises & conjunctiva . ENT: grossly tongue is normal . Neck: no obvious mass . Cardiovascular: S1 S2 normal no gallop . Respiratory: No rhonchi coarse breath sounds . Abdomen: soft . Skin: no rash seen on limited exam . Musculoskeletal: not rigid . Psychiatric:unable to assess . Neurologic: no seizure no involuntary movements         Lab Data:   Basic Metabolic Panel: Recent Labs  Lab 01/10/20 1538 01/11/20 0452 01/13/20 0552 01/14/20 0503 01/15/20 0521  NA 133* 130* 132* 132*  --   K 6.2* 4.7 4.0 3.4* 3.8  CL 97* 95* 95* 94*  --   CO2 18* 19* 23 25  --   GLUCOSE 213* 184* 145* 172*  --   BUN 141* 140* 107* 73*  --   CREATININE 4.56* 4.71* 4.03* 3.12*  --   CALCIUM 9.0 8.6* 8.6* 8.4*  --   PHOS 4.5 4.5 4.3 3.5  --     ABG: No results for input(s): PHART, PCO2ART, PO2ART, HCO3, O2SAT in the last 168 hours.  Liver Function Tests: Recent Labs  Lab 01/10/20 1538 01/11/20 0452 01/13/20 0552 01/14/20 0503  ALBUMIN 2.2* 2.1* 2.1* 2.2*   No results for input(s): LIPASE, AMYLASE in the last 168 hours. No results for input(s): AMMONIA in the last 168 hours.  CBC: Recent Labs  Lab 01/10/20 1538 01/11/20 0452 01/13/20 0552 01/14/20 0458 01/16/20 0535  WBC 17.9* 15.8* 17.5* 13.9* 9.3  HGB  8.6* 7.9* 7.4* 7.1* 7.9*  HCT 27.5* 26.3* 24.2* 23.4* 26.2*  MCV 97.2 103.1* 102.5* 101.3* 100.8*  PLT 129* 97* 96* 103* 124*    Cardiac Enzymes: No results for input(s): CKTOTAL, CKMB, CKMBINDEX, TROPONINI in the last 168 hours.  BNP (last 3 results) No results for input(s): BNP in the last 8760 hours.  ProBNP (last 3 results) No results for input(s): PROBNP in the last 8760 hours.  Radiological Exams: No results found.  Assessment/Plan Active Problems:   Acute on chronic respiratory failure with hypoxia (HCC)   Chronic systolic (congestive) heart failure (HCC)   End stage renal disease on dialysis [facility])   Chronic atrial fibrillation (HCC)   Bilateral pleural effusion   1. Acute on chronic respiratory failure with hypoxia plan is to continue with assist control titrate oxygen continue pulmonary toilet check the RSB I today and try to wean 2. Chronic systolic heart failure is compensated 3. End-stage renal disease on hemodialysis followed by nephrology 4. Chronic atrial fibrillation rate controlled continue supportive care 5. Bilateral pleural effusions with treatment baseline   I have personally seen and evaluated the patient, evaluated laboratory and imaging results, formulated the assessment and plan and placed orders. The Patient requires high complexity decision making with multiple systems involvement.  Rounds were done with the Respiratory Therapy Director and Staff therapists and discussed  with nursing staff also.  Allyne Gee, MD [facility] Pulmonary Critical Care Medicine Sleep Medicine

## 2020-01-17 DIAGNOSIS — I482 Chronic atrial fibrillation, unspecified: Secondary | ICD-10-CM | POA: Diagnosis not present

## 2020-01-17 DIAGNOSIS — J9621 Acute and chronic respiratory failure with hypoxia: Secondary | ICD-10-CM | POA: Diagnosis not present

## 2020-01-17 DIAGNOSIS — I5022 Chronic systolic (congestive) heart failure: Secondary | ICD-10-CM | POA: Diagnosis not present

## 2020-01-17 DIAGNOSIS — J90 Pleural effusion, not elsewhere classified: Secondary | ICD-10-CM | POA: Diagnosis not present

## 2020-01-17 LAB — RENAL FUNCTION PANEL
Albumin: 2.3 g/dL — ABNORMAL LOW (ref 3.5–5.0)
Anion gap: 15 (ref 5–15)
BUN: 52 mg/dL — ABNORMAL HIGH (ref 8–23)
CO2: 22 mmol/L (ref 22–32)
Calcium: 8.4 mg/dL — ABNORMAL LOW (ref 8.9–10.3)
Chloride: 94 mmol/L — ABNORMAL LOW (ref 98–111)
Creatinine, Ser: 3.09 mg/dL — ABNORMAL HIGH (ref 0.61–1.24)
GFR calc Af Amer: 23 mL/min — ABNORMAL LOW (ref >60)
GFR calc non Af Amer: 20 mL/min — ABNORMAL LOW (ref >60)
Glucose, Bld: 109 mg/dL — ABNORMAL HIGH (ref 70–99)
Phosphorus: 4.4 mg/dL (ref 2.5–4.6)
Potassium: 3.5 mmol/L (ref 3.5–5.1)
Sodium: 131 mmol/L — ABNORMAL LOW (ref 135–145)

## 2020-01-17 LAB — CBC
HCT: 26 % — ABNORMAL LOW (ref 39.0–52.0)
Hemoglobin: 8 g/dL — ABNORMAL LOW (ref 13.0–17.0)
MCH: 31.4 pg (ref 26.0–34.0)
MCHC: 30.8 g/dL (ref 30.0–36.0)
MCV: 102 fL — ABNORMAL HIGH (ref 80.0–100.0)
Platelets: 135 10*3/uL — ABNORMAL LOW (ref 150–400)
RBC: 2.55 MIL/uL — ABNORMAL LOW (ref 4.22–5.81)
RDW: 22.7 % — ABNORMAL HIGH (ref 11.5–15.5)
WBC: 11.8 10*3/uL — ABNORMAL HIGH (ref 4.0–10.5)
nRBC: 0 % (ref 0.0–0.2)

## 2020-01-17 NOTE — Progress Notes (Signed)
Pulmonary [place]   PULMONARY CRITICAL CARE SERVICE  PROGRESS NOTE  Date of Service: 01/17/2020  Jose Tucker  [id number]  DOB: [birth year]   DOA: 12/16/2019  Referring Physician: Merton Border, MD  HPI: Jose Tucker is a 69 y.o. male seen for follow up of Acute on Chronic Respiratory Failure.  Patient at this time is on pressure support mode has been on 12/5  Medications: Reviewed on Rounds  Physical Exam:  Vitals: Temperature is 98.6 pulse 107 respiratory rate 37 blood pressure is 137/93 saturations 100%  Ventilator Settings on pressure support FiO2 is 45% 12/5  . General: Comfortable at this time . Eyes: Grossly normal lids, irises & conjunctiva . ENT: grossly tongue is normal . Neck: no obvious mass . Cardiovascular: S1 S2 normal no gallop . Respiratory: No rhonchi coarse breath sounds . Abdomen: soft . Skin: no rash seen on limited exam . Musculoskeletal: not rigid . Psychiatric:unable to assess . Neurologic: no seizure no involuntary movements         Lab Data:   Basic Metabolic Panel: Recent Labs  Lab 01/10/20 1538 01/10/20 1538 01/11/20 0452 01/13/20 0552 01/14/20 0503 01/15/20 0521 01/17/20 0639  NA 133*  --  130* 132* 132*  --  131*  K 6.2*   < > 4.7 4.0 3.4* 3.8 3.5  CL 97*  --  95* 95* 94*  --  94*  CO2 18*  --  19* 23 25  --  22  GLUCOSE 213*  --  184* 145* 172*  --  109*  BUN 141*  --  140* 107* 73*  --  52*  CREATININE 4.56*  --  4.71* 4.03* 3.12*  --  3.09*  CALCIUM 9.0  --  8.6* 8.6* 8.4*  --  8.4*  PHOS 4.5  --  4.5 4.3 3.5  --  4.4   < > = values in this interval not displayed.    ABG: No results for input(s): PHART, PCO2ART, PO2ART, HCO3, O2SAT in the last 168 hours.  Liver Function Tests: Recent Labs  Lab 01/10/20 1538 01/11/20 0452 01/13/20 0552 01/14/20 0503 01/17/20 0639  ALBUMIN 2.2* 2.1* 2.1* 2.2* 2.3*   No results for input(s): LIPASE, AMYLASE in the last 168 hours. No  results for input(s): AMMONIA in the last 168 hours.  CBC: Recent Labs  Lab 01/11/20 0452 01/13/20 0552 01/14/20 0458 01/16/20 0535 01/17/20 0639  WBC 15.8* 17.5* 13.9* 9.3 11.8*  HGB 7.9* 7.4* 7.1* 7.9* 8.0*  HCT 26.3* 24.2* 23.4* 26.2* 26.0*  MCV 103.1* 102.5* 101.3* 100.8* 102.0*  PLT 97* 96* 103* 124* 135*    Cardiac Enzymes: No results for input(s): CKTOTAL, CKMB, CKMBINDEX, TROPONINI in the last 168 hours.  BNP (last 3 results) No results for input(s): BNP in the last 8760 hours.  ProBNP (last 3 results) No results for input(s): PROBNP in the last 8760 hours.  Radiological Exams: No results found.  Assessment/Plan Active Problems:   Acute on chronic respiratory failure with hypoxia (HCC)   Chronic systolic (congestive) heart failure (HCC)   End stage renal disease on dialysis [facility])   Chronic atrial fibrillation (HCC)   Bilateral pleural effusion   1. Acute on chronic respiratory failure hypoxia plan is to continue with the pressure support titrate oxygen as tolerated right now is on 12/5 with an FiO2 of 45%. 2. Chronic systolic heart failure compensated monitoring fluid status patient was being dialyzed by nephrology 3. End-stage renal disease on hemodialysis 4.  Chronic atrial fibrillation rate is controlled 5. pleural effusions chronic finding on chest film we will continue to monitor   I have personally seen and evaluated the patient, evaluated laboratory and imaging results, formulated the assessment and plan and placed orders. The Patient requires high complexity decision making with multiple systems involvement.  Rounds were done with the Respiratory Therapy Director and Staff therapists and discussed with nursing staff also.  Allyne Gee, MD [facility] Pulmonary Critical Care Medicine Sleep Medicine

## 2020-01-17 NOTE — Progress Notes (Signed)
[facility]  ROUNDING NOTE   Subjective:  Patient remains critically ill.  Noted to be tachypneic during exam today. Nods yes when asked about shortness of breath.  Objective:  Vital signs in last 24 hours:  Temperature 95.8 pulse 86 respirations 31 blood pressure 124/62  Physical Exam: General: Critically ill-appearing  Head: Normocephalic, atraumatic. Moist oral mucosal membranes  Eyes: Anicteric  Neck: Tracheostomy in place  Lungs:  Scattered rhonchi bilateral, vent assisted, tachypneic  Heart: S1S2 irregular  Abdomen:  Soft, nontender, bowel sounds present  Extremities: trace peripheral edema.  Neurologic: Awake, alert, following commands  Skin: Bilateral upper extremity ecchymoses  Access: Right IJ PermCath    Basic Metabolic Panel: Recent Labs  Lab 01/10/20 1538 01/10/20 1538 01/11/20 0452 01/11/20 0452 01/13/20 0552 01/14/20 0503 01/15/20 0521 01/17/20 0639  NA 133*  --  130*  --  132* 132*  --  131*  K 6.2*   < > 4.7  --  4.0 3.4* 3.8 3.5  CL 97*  --  95*  --  95* 94*  --  94*  CO2 18*  --  19*  --  23 25  --  22  GLUCOSE 213*  --  184*  --  145* 172*  --  109*  BUN 141*  --  140*  --  107* 73*  --  52*  CREATININE 4.56*  --  4.71*  --  4.03* 3.12*  --  3.09*  CALCIUM 9.0   < > 8.6*   < > 8.6* 8.4*  --  8.4*  PHOS 4.5  --  4.5  --  4.3 3.5  --  4.4   < > = values in this interval not displayed.    Liver Function Tests: Recent Labs  Lab 01/10/20 1538 01/11/20 0452 01/13/20 0552 01/14/20 0503 01/17/20 0639  ALBUMIN 2.2* 2.1* 2.1* 2.2* 2.3*   No results for input(s): LIPASE, AMYLASE in the last 168 hours. No results for input(s): AMMONIA in the last 168 hours.  CBC: Recent Labs  Lab 01/11/20 0452 01/13/20 0552 01/14/20 0458 01/16/20 0535 01/17/20 0639  WBC 15.8* 17.5* 13.9* 9.3 11.8*  HGB 7.9* 7.4* 7.1* 7.9* 8.0*  HCT 26.3* 24.2* 23.4* 26.2* 26.0*  MCV 103.1* 102.5* 101.3* 100.8* 102.0*  PLT 97* 96* 103* 124* 135*     Cardiac Enzymes: No results for input(s): CKTOTAL, CKMB, CKMBINDEX, TROPONINI in the last 168 hours.  BNP: Invalid input(s): POCBNP  CBG: No results for input(s): GLUCAP in the last 168 hours.  Microbiology: Results for orders placed or performed during the hospital encounter of 12/10/2019  C difficile quick scan w PCR reflex     Status: None   Collection Time: 12/18/19  4:49 AM   Specimen: STOOL  Result Value Ref Range Status   C Diff antigen NEGATIVE NEGATIVE Final   C Diff toxin NEGATIVE NEGATIVE Final   C Diff interpretation No C. difficile detected.  Corrected    Comment: Performed at [facility], [place] [address] [place], [place] [address] CORRECTED ON 03/13 AT 1510: PREVIOUSLY REPORTED AS VALID   Culture, respiratory (non-expectorated)     Status: None   Collection Time: 12/24/19  4:00 PM   Specimen: Tracheal Aspirate; Respiratory  Result Value Ref Range Status   Specimen Description TRACHEAL ASPIRATE  Final   Special Requests NONE  Final   Gram Stain   Final    ABUNDANT WBC PRESENT, PREDOMINANTLY PMN ABUNDANT GRAM POSITIVE RODS FEW GRAM NEGATIVE  RODS Performed at [facility], [place] [address] [place], [place] [address]    Culture MODERATE PROTEUS MIRABILIS  Final   Report Status 12/26/2019 FINAL  Final   Organism ID, Bacteria PROTEUS MIRABILIS  Final      Susceptibility   Proteus mirabilis - MIC*    AMPICILLIN <=2 SENSITIVE Sensitive     CEFAZOLIN 8 SENSITIVE Sensitive     CEFEPIME <=0.12 SENSITIVE Sensitive     CEFTAZIDIME <=1 SENSITIVE Sensitive     CEFTRIAXONE <=0.25 SENSITIVE Sensitive     CIPROFLOXACIN <=0.25 SENSITIVE Sensitive     GENTAMICIN <=1 SENSITIVE Sensitive     IMIPENEM 4 SENSITIVE Sensitive     TRIMETH/SULFA <=20 SENSITIVE Sensitive     AMPICILLIN/SULBACTAM <=2 SENSITIVE Sensitive     PIP/TAZO <=4 SENSITIVE Sensitive     * MODERATE PROTEUS MIRABILIS  Culture, blood (routine x 2)     Status: None   Collection Time: 12/25/19   5:16 PM   Specimen: BLOOD  Result Value Ref Range Status   Specimen Description BLOOD RIGHT ANTECUBITAL  Final   Special Requests   Final    BOTTLES DRAWN AEROBIC AND ANAEROBIC Blood Culture adequate volume   Culture   Final    NO GROWTH 5 DAYS Performed at [facility], [address], [place], [place] [address]    Report Status 12/30/2019 FINAL  Final  Culture, blood (routine x 2)     Status: None   Collection Time: 12/25/19  5:18 PM   Specimen: BLOOD  Result Value Ref Range Status   Specimen Description BLOOD RIGHT ANTECUBITAL  Final   Special Requests   Final    BOTTLES DRAWN AEROBIC AND ANAEROBIC Blood Culture adequate volume   Culture   Final    NO GROWTH 5 DAYS Performed at [facility], [place] [address], [place], [place] [address]    Report Status 12/30/2019 FINAL  Final  C difficile quick scan w PCR reflex     Status: Abnormal   Collection Time: 12/26/19 12:11 PM   Specimen: STOOL  Result Value Ref Range Status   C Diff antigen POSITIVE (A) NEGATIVE Final   C Diff toxin POSITIVE (A) NEGATIVE Final    Comment: CRITICAL RESULT CALLED TO, READ BACK BY AND VERIFIED WITH:  RN CHRISSY R. @[id number]  12/26/2019 AKT    C Diff interpretation Toxin producing C. difficile detected.  Final  Culture, blood (routine x 2)     Status: None   Collection Time: 01/05/20 12:43 PM   Specimen: BLOOD  Result Value Ref Range Status   Specimen Description BLOOD RIGHT ANTECUBITAL  Final   Special Requests   Final    BOTTLES DRAWN AEROBIC AND ANAEROBIC Blood Culture adequate volume   Culture   Final    NO GROWTH 5 DAYS Performed at [facility], [address], [place], [place] [address]    Report Status 01/10/2020 FINAL  Final  Culture, respiratory (non-expectorated)     Status: None   Collection Time: 01/05/20  2:55 PM   Specimen: Tracheal Aspirate; Respiratory  Result Value Ref Range Status   Specimen Description TRACHEAL ASPIRATE  Final   Special Requests NONE  Final    Gram Stain   Final    ABUNDANT WBC PRESENT,BOTH PMN AND MONONUCLEAR ABUNDANT GRAM NEGATIVE RODS Performed at [facility], [place] [address] [place], [place] [address]    Culture   Final    ABUNDANT KLEBSIELLA  PNEUMONIAE MODERATE PROTEUS MIRABILIS KLEBSIELLA PNEUMONIAE CONFIRMED CARBAPENEMASE RESISTANT ENTEROBACTERIACAE KLEBSIELLA PNEUMONIAE Confirmed Extended Spectrum Beta-Lactamase Producer (ESBL).  In bloodstream infections from ESBL organisms, carbapenems are preferred over piperacillin/tazobactam. They are shown to have a lower risk of mortality. KLEBSIELLA PNEUMONIAE MULTI-DRUG RESISTANT ORGANISM    Report Status 01/11/2020 FINAL  Final   Organism ID, Bacteria PROTEUS MIRABILIS  Final   Organism ID, Bacteria KLEBSIELLA PNEUMONIAE  Final      Susceptibility   Klebsiella pneumoniae - MIC*    AMPICILLIN >=32 RESISTANT Resistant     CEFAZOLIN >=64 RESISTANT Resistant     CEFEPIME >=32 RESISTANT Resistant     CEFTAZIDIME >=64 RESISTANT Resistant     CEFTRIAXONE >=64 RESISTANT Resistant     CIPROFLOXACIN <=0.25 SENSITIVE Sensitive     GENTAMICIN >=16 RESISTANT Resistant     IMIPENEM >=16 RESISTANT Resistant     TRIMETH/SULFA >=320 RESISTANT Resistant     AMPICILLIN/SULBACTAM >=32 RESISTANT Resistant     PIP/TAZO >=128 RESISTANT Resistant     * ABUNDANT KLEBSIELLA PNEUMONIAE   Proteus mirabilis - MIC*    AMPICILLIN <=2 SENSITIVE Sensitive     CEFAZOLIN <=4 SENSITIVE Sensitive     CEFEPIME <=0.12 SENSITIVE Sensitive     CEFTAZIDIME <=1 SENSITIVE Sensitive     CEFTRIAXONE <=0.25 SENSITIVE Sensitive     CIPROFLOXACIN <=0.25 SENSITIVE Sensitive     GENTAMICIN <=1 SENSITIVE Sensitive     IMIPENEM 8 INTERMEDIATE Intermediate     TRIMETH/SULFA <=20 SENSITIVE Sensitive     AMPICILLIN/SULBACTAM <=2 SENSITIVE Sensitive     PIP/TAZO <=4 SENSITIVE Sensitive     * MODERATE PROTEUS MIRABILIS  Culture, blood (routine x 2)     Status: None   Collection Time: 01/05/20  3:04 PM    Specimen: BLOOD  Result Value Ref Range Status   Specimen Description BLOOD RIGHT ANTECUBITAL  Final   Special Requests   Final    BOTTLES DRAWN AEROBIC ONLY Blood Culture adequate volume   Culture   Final    NO GROWTH 5 DAYS Performed at [facility], [address], [place], [place] [address]    Report Status 01/10/2020 FINAL  Final  Carbapenem Resistance Panel     Status: Abnormal   Collection Time: 01/05/20  3:08 PM  Result Value Ref Range Status   Carba Resistance IMP Gene NOT DETECTED NOT DETECTED Final   Carba Resistance VIM Gene NOT DETECTED NOT DETECTED Final   Carba Resistance NDM Gene NOT DETECTED NOT DETECTED Final   Carba Resistance KPC Gene DETECTED (A) NOT DETECTED Final    Comment: CRITICAL RESULT CALLED TO, READ BACK BY AND VERIFIED WITH: RBV RN D. Fara Olden [phone] FCP    Carba Resistance OXA48 Gene NOT DETECTED NOT DETECTED Final    Comment: (NOTE) Cepheid Carba-R is an FDA-cleared nucleic acid amplification test  (NAAT)for the detection and differentiation of genes encoding the  most prevalent carbapenemases in bacterial isolate samples. Carbapenemase gene identification and implementation of comprehensive  infection control measures are recommended by the CDC to prevent the  spread of the resistant organisms. Performed at [facility], [place] [address], [place], [place] [address]     Coagulation Studies: No results for input(s): LABPROT, INR in the last 72 hours.  Urinalysis: No results for input(s): COLORURINE, LABSPEC, PHURINE, GLUCOSEU, HGBUR, BILIRUBINUR, KETONESUR, PROTEINUR, UROBILINOGEN, NITRITE, LEUKOCYTESUR in the last 72 hours.  Invalid input(s): APPERANCEUR    Imaging: No results found.   Medications:  iohexol  Assessment/ Plan:  69 y.o. male with a PMHx of ESRD on HD, CHF, aortic stenosis, hypertension, diabetes mellitus type 2, hyperlipidemia, obstructive sleep apnea, chronic venous stasis with LE edema, and obesity  who was admitted to [facility] on 12/16/2019 for ongoing treatment of respiratory failure, ESRD, malnutrition, and generalized debility.    1.  ESRD on HD MWF.    Last week patient was on TTS dialysis schedule due to high inpatient volume.  Otherwise was on MWF schedule previously.  Will determine his dialysis day next through nursing.  2.  Acute respiratory failure.    Continue ventilatory support.  Patient noted be tachypneic today.  3.  Secondary hyperparathyroidism.    Phosphorus 4.4 this a.m. and within acceptable range.  Continue to monitor bone mineral metabolism parameters.  4.  Anemia of chronic kidney disease.  Hemoglobin now up to 8.0 and does appear to have stabilized.  Was previously seen by GI as well.    LOS: 0 [place]weed 4/12/20218:25 AM

## 2020-01-18 ENCOUNTER — Other Ambulatory Visit (HOSPITAL_COMMUNITY): Payer: Medicare Other

## 2020-01-18 DIAGNOSIS — J9621 Acute and chronic respiratory failure with hypoxia: Secondary | ICD-10-CM | POA: Diagnosis not present

## 2020-01-18 DIAGNOSIS — I482 Chronic atrial fibrillation, unspecified: Secondary | ICD-10-CM | POA: Diagnosis not present

## 2020-01-18 DIAGNOSIS — I5022 Chronic systolic (congestive) heart failure: Secondary | ICD-10-CM | POA: Diagnosis not present

## 2020-01-18 DIAGNOSIS — J90 Pleural effusion, not elsewhere classified: Secondary | ICD-10-CM | POA: Diagnosis not present

## 2020-01-18 LAB — CBC
HCT: 27.4 % — ABNORMAL LOW (ref 39.0–52.0)
Hemoglobin: 8.3 g/dL — ABNORMAL LOW (ref 13.0–17.0)
MCH: 31.1 pg (ref 26.0–34.0)
MCHC: 30.3 g/dL (ref 30.0–36.0)
MCV: 102.6 fL — ABNORMAL HIGH (ref 80.0–100.0)
Platelets: 113 10*3/uL — ABNORMAL LOW (ref 150–400)
RBC: 2.67 MIL/uL — ABNORMAL LOW (ref 4.22–5.81)
RDW: 22.2 % — ABNORMAL HIGH (ref 11.5–15.5)
WBC: 9.4 10*3/uL (ref 4.0–10.5)
nRBC: 0 % (ref 0.0–0.2)

## 2020-01-18 NOTE — Progress Notes (Signed)
P[facility] [place]   PULMONARY CRITICAL CARE SERVICE  PROGRESS NOTE  Date of Service: 01/18/2020  Jose Tucker  YWV:[id number]  DOB: 1951/02/15   DOA: 01/05/2020  Referring Physician: Merton Border, MD  HPI: Jose Tucker is a 69 y.o. male seen for follow up of Acute on Chronic Respiratory Failure.  Patient is on pressure support mode has been on 45% FiO2 goal of 16 hours  Medications: Reviewed on Rounds  Physical Exam:  Vitals: Temperature 96.3 pulse 99 respiratory 22 blood pressure is 149/72 saturations 96%  Ventilator Settings on pressure support FiO2 45% pressure poor 12 PEEP 5  . General: Comfortable at this time . Eyes: Grossly normal lids, irises & conjunctiva . ENT: grossly tongue is normal . Neck: no obvious mass . Cardiovascular: S1 S2 normal no gallop . Respiratory: No rhonchi no rales are noted at this time . Abdomen: soft . Skin: no rash seen on limited exam . Musculoskeletal: not rigid . Psychiatric:unable to assess . Neurologic: no seizure no involuntary movements         Lab Data:   Basic Metabolic Panel: Recent Labs  Lab 01/13/20 0552 01/14/20 0503 01/15/20 0521 01/17/20 0639  NA 132* 132*  --  131*  K 4.0 3.4* 3.8 3.5  CL 95* 94*  --  94*  CO2 23 25  --  22  GLUCOSE 145* 172*  --  109*  BUN 107* 73*  --  52*  CREATININE 4.03* 3.12*  --  3.09*  CALCIUM 8.6* 8.4*  --  8.4*  PHOS 4.3 3.5  --  4.4    ABG: No results for input(s): PHART, PCO2ART, PO2ART, HCO3, O2SAT in the last 168 hours.  Liver Function Tests: Recent Labs  Lab 01/13/20 0552 01/14/20 0503 01/17/20 0639  ALBUMIN 2.1* 2.2* 2.3*   No results for input(s): LIPASE, AMYLASE in the last 168 hours. No results for input(s): AMMONIA in the last 168 hours.  CBC: Recent Labs  Lab 01/13/20 0552 01/14/20 0458 01/16/20 0535 01/17/20 0639 01/18/20 0536  WBC 17.5* 13.9* 9.3 11.8* 9.4  HGB 7.4* 7.1* 7.9* 8.0* 8.3*  HCT 24.2* 23.4*  26.2* 26.0* 27.4*  MCV 102.5* 101.3* 100.8* 102.0* 102.6*  PLT 96* 103* 124* 135* 113*    Cardiac Enzymes: No results for input(s): CKTOTAL, CKMB, CKMBINDEX, TROPONINI in the last 168 hours.  BNP (last 3 results) No results for input(s): BNP in the last 8760 hours.  ProBNP (last 3 results) No results for input(s): PROBNP in the last 8760 hours.  Radiological Exams: CT ABDOMEN PELVIS WO CONTRAST  Result Date: 01/18/2020 CLINICAL DATA:  Peg tube exchange evaluate abdominal distension. EXAM: CT ABDOMEN AND PELVIS WITHOUT CONTRAST TECHNIQUE: Multidetector CT imaging of the abdomen and pelvis was performed following the standard protocol without IV contrast. COMPARISON:  None. FINDINGS: Lower chest: There are moderate to large bilateral pleural effusions with significantly diminished aeration to the lower lobes bilateral. The heart size is enlarged. Small pericardial effusion. Hepatobiliary: The liver has a slightly irregular contour. There is relative hypertrophy of the caudate lobe of liver. No focal liver abnormality. Cholecystectomy. No biliary dilatation. Pancreas: Unremarkable. No pancreatic ductal dilatation or surrounding inflammatory changes. Spleen: Normal in size without focal abnormality. Adrenals/Urinary Tract: Normal appearance of the adrenal glands. Bilateral renal atrophy identified compatible with end-stage kidney disease. The urinary bladder appears collapsed. Tiny calcification within the distal right ureter measures 3 mm. At the right UVJ there is a stone measuring 4 mm.  No right-sided hydronephrosis or hydroureter. Stomach/Bowel: Gastrostomy tube is identified. The retention balloon appears intraluminal. The stomach is nondistended. No abnormal bowel wall thickening, inflammation or distension. Vascular/Lymphatic: Aortic atherosclerosis. Extensive branch vessel disease. No aneurysm. Multiple small retroperitoneal lymph nodes are identified without adenopathy. Borderline enlarged  right external iliac node measures 1.1 cm. Reproductive: Prostate is unremarkable. Other: There is marked diffuse edema with skin thickening and subcutaneous fat stranding involving the body wall. No pneumoperitoneum. There is a small volume of high attenuation fluid overlying the liver and extending along the peritoneal reflections into the pelvis. Musculoskeletal: Right anterior seventh and eighth rib fractures are identified. IMPRESSION: 1. Small volume of hemoperitoneum is identified within the abdomen and pelvis. 2. Acute right anterior seventh and eighth rib fractures. Is there a history of recent trauma? 3. Marked diffuse body wall edema with skin thickening and subcutaneous fat stranding 4. Morphologic features of the liver suggestive of early cirrhosis. 5. Bilateral renal atrophy compatible with end-stage kidney disease. 6. Aortic atherosclerosis. Aortic Atherosclerosis (ICD10-I70.0). Electronically Signed   By: Kerby Moors M.D.   On: 01/18/2020 02:09    Assessment/Plan Active Problems:   Acute on chronic respiratory failure with hypoxia (HCC)   Chronic systolic (congestive) heart failure (HCC)   End stage renal disease on dialysis [facility])   Chronic atrial fibrillation (HCC)   Bilateral pleural effusion   1. Acute on chronic respiratory failure hypoxia plan is to continue with pressure support titrate as tolerated goal is for 16 hours 2. Chronic systolic heart failure compensated we will continue to monitor 3. End-stage renal disease on hemodialysis 4. Chronic atrial fibrillation rate is controlled 5. Bilateral pleural effusions at baseline we will continue to follow   I have personally seen and evaluated the patient, evaluated laboratory and imaging results, formulated the assessment and plan and placed orders. The Patient requires high complexity decision making with multiple systems involvement.  Rounds were done with the Respiratory Therapy Director and Staff therapists and  discussed with nursing staff also.  Allyne Gee, MD [facility] Pulmonary Critical Care Medicine Sleep Medicine

## 2020-01-19 DIAGNOSIS — J90 Pleural effusion, not elsewhere classified: Secondary | ICD-10-CM | POA: Diagnosis not present

## 2020-01-19 DIAGNOSIS — I5022 Chronic systolic (congestive) heart failure: Secondary | ICD-10-CM | POA: Diagnosis not present

## 2020-01-19 DIAGNOSIS — J9621 Acute and chronic respiratory failure with hypoxia: Secondary | ICD-10-CM | POA: Diagnosis not present

## 2020-01-19 DIAGNOSIS — I482 Chronic atrial fibrillation, unspecified: Secondary | ICD-10-CM | POA: Diagnosis not present

## 2020-01-19 LAB — RENAL FUNCTION PANEL
Albumin: 2.5 g/dL — ABNORMAL LOW (ref 3.5–5.0)
Anion gap: 14 (ref 5–15)
BUN: 43 mg/dL — ABNORMAL HIGH (ref 8–23)
CO2: 24 mmol/L (ref 22–32)
Calcium: 8.6 mg/dL — ABNORMAL LOW (ref 8.9–10.3)
Chloride: 96 mmol/L — ABNORMAL LOW (ref 98–111)
Creatinine, Ser: 3.26 mg/dL — ABNORMAL HIGH (ref 0.61–1.24)
GFR calc Af Amer: 21 mL/min — ABNORMAL LOW (ref >60)
GFR calc non Af Amer: 18 mL/min — ABNORMAL LOW (ref >60)
Glucose, Bld: 108 mg/dL — ABNORMAL HIGH (ref 70–99)
Phosphorus: 5.5 mg/dL — ABNORMAL HIGH (ref 2.5–4.6)
Potassium: 4 mmol/L (ref 3.5–5.1)
Sodium: 134 mmol/L — ABNORMAL LOW (ref 135–145)

## 2020-01-19 LAB — CBC
HCT: 26.5 % — ABNORMAL LOW (ref 39.0–52.0)
Hemoglobin: 7.8 g/dL — ABNORMAL LOW (ref 13.0–17.0)
MCH: 30.4 pg (ref 26.0–34.0)
MCHC: 29.4 g/dL — ABNORMAL LOW (ref 30.0–36.0)
MCV: 103.1 fL — ABNORMAL HIGH (ref 80.0–100.0)
Platelets: 119 10*3/uL — ABNORMAL LOW (ref 150–400)
RBC: 2.57 MIL/uL — ABNORMAL LOW (ref 4.22–5.81)
RDW: 21.2 % — ABNORMAL HIGH (ref 11.5–15.5)
WBC: 10.9 10*3/uL — ABNORMAL HIGH (ref 4.0–10.5)
nRBC: 0 % (ref 0.0–0.2)

## 2020-01-19 NOTE — Progress Notes (Signed)
Called to unit to discuss possible gastrostomy tube exchange.   From CT report 4/13: Stomach/Bowel: Gastrostomy tube is identified. The retention balloon appears intraluminal. The stomach is nondistended. No abnormal bowel wall thickening, inflammation or distension.  RN at bedside during conversation and was able to flush G-tube without issue.   Order for exchange cancelled at this time.   Brynda Greathouse, MS RD PA-C

## 2020-01-19 NOTE — Progress Notes (Signed)
Pulmonary [place]   PULMONARY CRITICAL CARE SERVICE  PROGRESS NOTE  Date of Service: 01/19/2020  Brenner Visconti  UEA:[id number]  DOB: 09/15/1951   DOA: 12/07/2019  Referring Physician: Merton Border, MD  HPI: Jose Tucker is a 69 y.o. male seen for follow up of Acute on Chronic Respiratory Failure.  Patient is on pressure support failed attempt advanced weaning back on assist control  Medications: Reviewed on Rounds  Physical Exam:  Vitals: Temperature is 97.1 pulse 101 respiratory 26 blood pressure 122/88 saturations 97%  Ventilator Settings pressure support failure placed back on assist control with an FiO2 45% tidal volume 500 with a PEEP of 5  . General: Comfortable at this time . Eyes: Grossly normal lids, irises & conjunctiva . ENT: grossly tongue is normal . Neck: no obvious mass . Cardiovascular: S1 S2 normal no gallop . Respiratory: No rhonchi coarse breath sounds . Abdomen: soft . Skin: no rash seen on limited exam . Musculoskeletal: not rigid . Psychiatric:unable to assess . Neurologic: no seizure no involuntary movements         Lab Data:   Basic Metabolic Panel: Recent Labs  Lab 01/13/20 0552 01/14/20 0503 01/15/20 0521 01/17/20 0639 01/19/20 0543  NA 132* 132*  --  131* 134*  K 4.0 3.4* 3.8 3.5 4.0  CL 95* 94*  --  94* 96*  CO2 23 25  --  22 24  GLUCOSE 145* 172*  --  109* 108*  BUN 107* 73*  --  52* 43*  CREATININE 4.03* 3.12*  --  3.09* 3.26*  CALCIUM 8.6* 8.4*  --  8.4* 8.6*  PHOS 4.3 3.5  --  4.4 5.5*    ABG: No results for input(s): PHART, PCO2ART, PO2ART, HCO3, O2SAT in the last 168 hours.  Liver Function Tests: Recent Labs  Lab 01/13/20 0552 01/14/20 0503 01/17/20 0639 01/19/20 0543  ALBUMIN 2.1* 2.2* 2.3* 2.5*   No results for input(s): LIPASE, AMYLASE in the last 168 hours. No results for input(s): AMMONIA in the last 168 hours.  CBC: Recent Labs  Lab 01/14/20 0458 01/16/20 0535  01/17/20 0639 01/18/20 0536 01/19/20 0543  WBC 13.9* 9.3 11.8* 9.4 10.9*  HGB 7.1* 7.9* 8.0* 8.3* 7.8*  HCT 23.4* 26.2* 26.0* 27.4* 26.5*  MCV 101.3* 100.8* 102.0* 102.6* 103.1*  PLT 103* 124* 135* 113* 119*    Cardiac Enzymes: No results for input(s): CKTOTAL, CKMB, CKMBINDEX, TROPONINI in the last 168 hours.  BNP (last 3 results) No results for input(s): BNP in the last 8760 hours.  ProBNP (last 3 results) No results for input(s): PROBNP in the last 8760 hours.  Radiological Exams: CT ABDOMEN PELVIS WO CONTRAST  Result Date: 01/18/2020 CLINICAL DATA:  Peg tube exchange evaluate abdominal distension. EXAM: CT ABDOMEN AND PELVIS WITHOUT CONTRAST TECHNIQUE: Multidetector CT imaging of the abdomen and pelvis was performed following the standard protocol without IV contrast. COMPARISON:  None. FINDINGS: Lower chest: There are moderate to large bilateral pleural effusions with significantly diminished aeration to the lower lobes bilateral. The heart size is enlarged. Small pericardial effusion. Hepatobiliary: The liver has a slightly irregular contour. There is relative hypertrophy of the caudate lobe of liver. No focal liver abnormality. Cholecystectomy. No biliary dilatation. Pancreas: Unremarkable. No pancreatic ductal dilatation or surrounding inflammatory changes. Spleen: Normal in size without focal abnormality. Adrenals/Urinary Tract: Normal appearance of the adrenal glands. Bilateral renal atrophy identified compatible with end-stage kidney disease. The urinary bladder appears collapsed. Tiny calcification within  the distal right ureter measures 3 mm. At the right UVJ there is a stone measuring 4 mm. No right-sided hydronephrosis or hydroureter. Stomach/Bowel: Gastrostomy tube is identified. The retention balloon appears intraluminal. The stomach is nondistended. No abnormal bowel wall thickening, inflammation or distension. Vascular/Lymphatic: Aortic atherosclerosis. Extensive branch  vessel disease. No aneurysm. Multiple small retroperitoneal lymph nodes are identified without adenopathy. Borderline enlarged right external iliac node measures 1.1 cm. Reproductive: Prostate is unremarkable. Other: There is marked diffuse edema with skin thickening and subcutaneous fat stranding involving the body wall. No pneumoperitoneum. There is a small volume of high attenuation fluid overlying the liver and extending along the peritoneal reflections into the pelvis. Musculoskeletal: Right anterior seventh and eighth rib fractures are identified. IMPRESSION: 1. Small volume of hemoperitoneum is identified within the abdomen and pelvis. 2. Acute right anterior seventh and eighth rib fractures. Is there a history of recent trauma? 3. Marked diffuse body wall edema with skin thickening and subcutaneous fat stranding 4. Morphologic features of the liver suggestive of early cirrhosis. 5. Bilateral renal atrophy compatible with end-stage kidney disease. 6. Aortic atherosclerosis. Aortic Atherosclerosis (ICD10-I70.0). Electronically Signed   By: Kerby Moors M.D.   On: 01/18/2020 02:09    Assessment/Plan Active Problems:   Acute on chronic respiratory failure with hypoxia (HCC)   Chronic systolic (congestive) heart failure (HCC)   End stage renal disease on dialysis [facility])   Chronic atrial fibrillation (HCC)   Bilateral pleural effusion   1. Acute on chronic respiratory failure hypoxia we will continue with assist control FiO2 45% for now reassess the RSB I mechanics 2. Chronic systolic heart failure compensated 3. End-stage renal disease on hemodialysis 4. Chronic atrial fibrillation rate controlled 5. Bilateral effusions stable we will continue to follow   I have personally seen and evaluated the patient, evaluated laboratory and imaging results, formulated the assessment and plan and placed orders. The Patient requires high complexity decision making with multiple systems involvement.   Rounds were done with the Respiratory Therapy Director and Staff therapists and discussed with nursing staff also.  Allyne Gee, MD [facility] Pulmonary Critical Care Medicine Sleep Medicine

## 2020-01-19 NOTE — Progress Notes (Signed)
[facility]  ROUNDING NOTE   Subjective:  Patient seen and evaluated bedside. Patient has hand restraints on now. Due for dialysis treatment again today.  Objective:  Vital signs in last 24 hours:  Temperature 97.1 pulse 101 respirations 26 blood pressure 122/58  Physical Exam: General: Critically ill-appearing  Head: Normocephalic, atraumatic. Moist oral mucosal membranes  Eyes: Anicteric  Neck: Tracheostomy in place  Lungs:  Scattered rhonchi bilateral, vent assisted  Heart: S1S2 irregular  Abdomen:  Soft, nontender, bowel sounds present  Extremities: trace peripheral edema.  Neurologic: Awake, alert, following commands  Skin: Bilateral upper extremity ecchymoses  Access: Right IJ PermCath    Basic Metabolic Panel: Recent Labs  Lab 01/13/20 0552 01/13/20 0552 01/14/20 0503 01/15/20 0521 01/17/20 0639 01/19/20 0543  NA 132*  --  132*  --  131* 134*  K 4.0  --  3.4* 3.8 3.5 4.0  CL 95*  --  94*  --  94* 96*  CO2 23  --  25  --  22 24  GLUCOSE 145*  --  172*  --  109* 108*  BUN 107*  --  73*  --  52* 43*  CREATININE 4.03*  --  3.12*  --  3.09* 3.26*  CALCIUM 8.6*   < > 8.4*  --  8.4* 8.6*  PHOS 4.3  --  3.5  --  4.4 5.5*   < > = values in this interval not displayed.    Liver Function Tests: Recent Labs  Lab 01/13/20 0552 01/14/20 0503 01/17/20 0639 01/19/20 0543  ALBUMIN 2.1* 2.2* 2.3* 2.5*   No results for input(s): LIPASE, AMYLASE in the last 168 hours. No results for input(s): AMMONIA in the last 168 hours.  CBC: Recent Labs  Lab 01/14/20 0458 01/16/20 0535 01/17/20 0639 01/18/20 0536 01/19/20 0543  WBC 13.9* 9.3 11.8* 9.4 10.9*  HGB 7.1* 7.9* 8.0* 8.3* 7.8*  HCT 23.4* 26.2* 26.0* 27.4* 26.5*  MCV 101.3* 100.8* 102.0* 102.6* 103.1*  PLT 103* 124* 135* 113* 119*    Cardiac Enzymes: No results for input(s): CKTOTAL, CKMB, CKMBINDEX, TROPONINI in the last 168 hours.  BNP: Invalid input(s): POCBNP  CBG: No results for  input(s): GLUCAP in the last 168 hours.  Microbiology: Results for orders placed or performed during the hospital encounter of 12/29/2019  C difficile quick scan w PCR reflex     Status: None   Collection Time: 12/18/19  4:49 AM   Specimen: STOOL  Result Value Ref Range Status   C Diff antigen NEGATIVE NEGATIVE Final   C Diff toxin NEGATIVE NEGATIVE Final   C Diff interpretation No C. difficile detected.  Corrected    Comment: Performed at [facility], [place] [address], [place], [place] [address] CORRECTED ON 03/13 AT 1510: PREVIOUSLY REPORTED AS VALID   Culture, respiratory (non-expectorated)     Status: None   Collection Time: 12/24/19  4:00 PM   Specimen: Tracheal Aspirate; Respiratory  Result Value Ref Range Status   Specimen Description TRACHEAL ASPIRATE  Final   Special Requests NONE  Final   Gram Stain   Final    ABUNDANT WBC PRESENT, PREDOMINANTLY PMN ABUNDANT GRAM POSITIVE RODS FEW GRAM NEGATIVE RODS Performed at [facility], [place] [address], [place], [place] [address]    Culture MODERATE PROTEUS MIRABILIS  Final   Report Status 12/26/2019 FINAL  Final   Organism ID, Bacteria PROTEUS MIRABILIS  Final      Susceptibility   Proteus  mirabilis - MIC*    AMPICILLIN <=2 SENSITIVE Sensitive     CEFAZOLIN 8 SENSITIVE Sensitive     CEFEPIME <=0.12 SENSITIVE Sensitive     CEFTAZIDIME <=1 SENSITIVE Sensitive     CEFTRIAXONE <=0.25 SENSITIVE Sensitive     CIPROFLOXACIN <=0.25 SENSITIVE Sensitive     GENTAMICIN <=1 SENSITIVE Sensitive     IMIPENEM 4 SENSITIVE Sensitive     TRIMETH/SULFA <=20 SENSITIVE Sensitive     AMPICILLIN/SULBACTAM <=2 SENSITIVE Sensitive     PIP/TAZO <=4 SENSITIVE Sensitive     * MODERATE PROTEUS MIRABILIS  Culture, blood (routine x 2)     Status: None   Collection Time: 12/25/19  5:16 PM   Specimen: BLOOD  Result Value Ref Range Status   Specimen Description BLOOD RIGHT ANTECUBITAL  Final   Special Requests   Final    BOTTLES DRAWN AEROBIC  AND ANAEROBIC Blood Culture adequate volume   Culture   Final    NO GROWTH 5 DAYS Performed at [facility], [place] [address], [place], [place] [address]    Report Status 12/30/2019 FINAL  Final  Culture, blood (routine x 2)     Status: None   Collection Time: 12/25/19  5:18 PM   Specimen: BLOOD  Result Value Ref Range Status   Specimen Description BLOOD RIGHT ANTECUBITAL  Final   Special Requests   Final    BOTTLES DRAWN AEROBIC AND ANAEROBIC Blood Culture adequate volume   Culture   Final    NO GROWTH 5 DAYS Performed at [facility], [place] [address] [place], [place] [address]    Report Status 12/30/2019 FINAL  Final  C difficile quick scan w PCR reflex     Status: Abnormal   Collection Time: 12/26/19 12:11 PM   Specimen: STOOL  Result Value Ref Range Status   C Diff antigen POSITIVE (A) NEGATIVE Final   C Diff toxin POSITIVE (A) NEGATIVE Final    Comment: CRITICAL RESULT CALLED TO, READ BACK BY AND VERIFIED WITH:  RN CHRISSY R. @1817  12/26/2019 AKT    C Diff interpretation Toxin producing C. difficile detected.  Final  Culture, blood (routine x 2)     Status: None   Collection Time: 01/05/20 12:43 PM   Specimen: BLOOD  Result Value Ref Range Status   Specimen Description BLOOD RIGHT ANTECUBITAL  Final   Special Requests   Final    BOTTLES DRAWN AEROBIC AND ANAEROBIC Blood Culture adequate volume   Culture   Final    NO GROWTH 5 DAYS Performed at [facility], [address], [place], [place] [address]    Report Status 01/10/2020 FINAL  Final  Culture, respiratory (non-expectorated)     Status: None   Collection Time: 01/05/20  2:55 PM   Specimen: Tracheal Aspirate; Respiratory  Result Value Ref Range Status   Specimen Description TRACHEAL ASPIRATE  Final   Special Requests NONE  Final   Gram Stain   Final    ABUNDANT WBC PRESENT,BOTH PMN AND MONONUCLEAR ABUNDANT GRAM NEGATIVE RODS Performed at [facility], [place] [address] [place], [place]  [address]    Culture   Final    ABUNDANT KLEBSIELLA PNEUMONIAE MODERATE PROTEUS MIRABILIS KLEBSIELLA PNEUMONIAE CONFIRMED CARBAPENEMASE RESISTANT ENTEROBACTERIACAE KLEBSIELLA PNEUMONIAE Confirmed Extended Spectrum Beta-Lactamase Producer (ESBL).  In bloodstream infections from ESBL organisms, carbapenems are preferred over piperacillin/tazobactam. They are shown to have a lower risk of mortality. KLEBSIELLA PNEUMONIAE MULTI-DRUG RESISTANT ORGANISM    Report  Status 01/11/2020 FINAL  Final   Organism ID, Bacteria PROTEUS MIRABILIS  Final   Organism ID, Bacteria KLEBSIELLA PNEUMONIAE  Final      Susceptibility   Klebsiella pneumoniae - MIC*    AMPICILLIN >=32 RESISTANT Resistant     CEFAZOLIN >=64 RESISTANT Resistant     CEFEPIME >=32 RESISTANT Resistant     CEFTAZIDIME >=64 RESISTANT Resistant     CEFTRIAXONE >=64 RESISTANT Resistant     CIPROFLOXACIN <=0.25 SENSITIVE Sensitive     GENTAMICIN >=16 RESISTANT Resistant     IMIPENEM >=16 RESISTANT Resistant     TRIMETH/SULFA >=320 RESISTANT Resistant     AMPICILLIN/SULBACTAM >=32 RESISTANT Resistant     PIP/TAZO >=128 RESISTANT Resistant     * ABUNDANT KLEBSIELLA PNEUMONIAE   Proteus mirabilis - MIC*    AMPICILLIN <=2 SENSITIVE Sensitive     CEFAZOLIN <=4 SENSITIVE Sensitive     CEFEPIME <=0.12 SENSITIVE Sensitive     CEFTAZIDIME <=1 SENSITIVE Sensitive     CEFTRIAXONE <=0.25 SENSITIVE Sensitive     CIPROFLOXACIN <=0.25 SENSITIVE Sensitive     GENTAMICIN <=1 SENSITIVE Sensitive     IMIPENEM 8 INTERMEDIATE Intermediate     TRIMETH/SULFA <=20 SENSITIVE Sensitive     AMPICILLIN/SULBACTAM <=2 SENSITIVE Sensitive     PIP/TAZO <=4 SENSITIVE Sensitive     * MODERATE PROTEUS MIRABILIS  Culture, blood (routine x 2)     Status: None   Collection Time: 01/05/20  3:04 PM   Specimen: BLOOD  Result Value Ref Range Status   Specimen Description BLOOD RIGHT ANTECUBITAL  Final   Special Requests   Final    BOTTLES DRAWN AEROBIC ONLY Blood Culture  adequate volume   Culture   Final    NO GROWTH 5 DAYS Performed at [facility], [address] [place], [place] [address]    Report Status 01/10/2020 FINAL  Final  Carbapenem Resistance Panel     Status: Abnormal   Collection Time: 01/05/20  3:08 PM  Result Value Ref Range Status   Carba Resistance IMP Gene NOT DETECTED NOT DETECTED Final   Carba Resistance VIM Gene NOT DETECTED NOT DETECTED Final   Carba Resistance NDM Gene NOT DETECTED NOT DETECTED Final   Carba Resistance KPC Gene DETECTED (A) NOT DETECTED Final    Comment: CRITICAL RESULT CALLED TO, READ BACK BY AND VERIFIED WITH: RBV RN D. Fara Olden [phone] [id number] FCP    Carba Resistance OXA48 Gene NOT DETECTED NOT DETECTED Final    Comment: (NOTE) Cepheid Carba-R is an FDA-cleared nucleic acid amplification test  (NAAT)for the detection and differentiation of genes encoding the  most prevalent carbapenemases in bacterial isolate samples. Carbapenemase gene identification and implementation of comprehensive  infection control measures are recommended by the CDC to prevent the  spread of the resistant organisms. Performed at [facility], [place] [address] [place], [place] [address]     Coagulation Studies: No results for input(s): LABPROT, INR in the last 72 hours.  Urinalysis: No results for input(s): COLORURINE, LABSPEC, PHURINE, GLUCOSEU, HGBUR, BILIRUBINUR, KETONESUR, PROTEINUR, UROBILINOGEN, NITRITE, LEUKOCYTESUR in the last 72 hours.  Invalid input(s): APPERANCEUR    Imaging: CT ABDOMEN PELVIS WO CONTRAST  Result Date: 01/18/2020 CLINICAL DATA:  Peg tube exchange evaluate abdominal distension. EXAM: CT ABDOMEN AND PELVIS WITHOUT CONTRAST TECHNIQUE: Multidetector CT imaging of the abdomen and pelvis was performed following the standard protocol without IV contrast. COMPARISON:  None. FINDINGS: Lower chest: There are moderate to large bilateral pleural effusions with  significantly diminished aeration to the  lower lobes bilateral. The heart size is enlarged. Small pericardial effusion. Hepatobiliary: The liver has a slightly irregular contour. There is relative hypertrophy of the caudate lobe of liver. No focal liver abnormality. Cholecystectomy. No biliary dilatation. Pancreas: Unremarkable. No pancreatic ductal dilatation or surrounding inflammatory changes. Spleen: Normal in size without focal abnormality. Adrenals/Urinary Tract: Normal appearance of the adrenal glands. Bilateral renal atrophy identified compatible with end-stage kidney disease. The urinary bladder appears collapsed. Tiny calcification within the distal right ureter measures 3 mm. At the right UVJ there is a stone measuring 4 mm. No right-sided hydronephrosis or hydroureter. Stomach/Bowel: Gastrostomy tube is identified. The retention balloon appears intraluminal. The stomach is nondistended. No abnormal bowel wall thickening, inflammation or distension. Vascular/Lymphatic: Aortic atherosclerosis. Extensive branch vessel disease. No aneurysm. Multiple small retroperitoneal lymph nodes are identified without adenopathy. Borderline enlarged right external iliac node measures 1.1 cm. Reproductive: Prostate is unremarkable. Other: There is marked diffuse edema with skin thickening and subcutaneous fat stranding involving the body wall. No pneumoperitoneum. There is a small volume of high attenuation fluid overlying the liver and extending along the peritoneal reflections into the pelvis. Musculoskeletal: Right anterior seventh and eighth rib fractures are identified. IMPRESSION: 1. Small volume of hemoperitoneum is identified within the abdomen and pelvis. 2. Acute right anterior seventh and eighth rib fractures. Is there a history of recent trauma? 3. Marked diffuse body wall edema with skin thickening and subcutaneous fat stranding 4. Morphologic features of the liver suggestive of early cirrhosis. 5. Bilateral renal atrophy compatible with end-stage  kidney disease. 6. Aortic atherosclerosis. Aortic Atherosclerosis (ICD10-I70.0). Electronically Signed   By: Kerby Moors M.D.   On: 01/18/2020 02:09     Medications:     iohexol  Assessment/ Plan:  69 y.o. male with a PMHx of ESRD on HD, CHF, aortic stenosis, hypertension, diabetes mellitus type 2, hyperlipidemia, obstructive sleep apnea, chronic venous stasis with LE edema, and obesity who was admitted to [facility] on 12/06/2019 for ongoing treatment of respiratory failure, ESRD, malnutrition, and generalized debility.    1.  ESRD on HD MWF.    Patient due for dialysis treatment today.  Orders have been prepared.  Volume control appears to be fair.  Potassium normal at 4.0.  2.  Acute respiratory failure.    Patient maintained on ventilatory support at this time.  Has been difficult to wean from the ventilator.  Tachypnea persist..  3.  Secondary hyperparathyroidism.    Phosphorus remains within acceptable range at 5.5.  We will continue to monitor serum calcium, phosphorus.  4.  Anemia of chronic kidney disease.  Hemoglobin did drift down a bit to 7.8.  Previously evaluated by gastroenterology but not felt to be a candidate for luminal examination given multiple comorbidities and instability.    LOS: 0 Abbegail Matuska 4/14/20219:22 AM

## 2020-01-20 DIAGNOSIS — J90 Pleural effusion, not elsewhere classified: Secondary | ICD-10-CM | POA: Diagnosis not present

## 2020-01-20 DIAGNOSIS — J9621 Acute and chronic respiratory failure with hypoxia: Secondary | ICD-10-CM | POA: Diagnosis not present

## 2020-01-20 DIAGNOSIS — I482 Chronic atrial fibrillation, unspecified: Secondary | ICD-10-CM | POA: Diagnosis not present

## 2020-01-20 DIAGNOSIS — I5022 Chronic systolic (congestive) heart failure: Secondary | ICD-10-CM | POA: Diagnosis not present

## 2020-01-20 LAB — CBC
HCT: 26.4 % — ABNORMAL LOW (ref 39.0–52.0)
Hemoglobin: 7.9 g/dL — ABNORMAL LOW (ref 13.0–17.0)
MCH: 30.7 pg (ref 26.0–34.0)
MCHC: 29.9 g/dL — ABNORMAL LOW (ref 30.0–36.0)
MCV: 102.7 fL — ABNORMAL HIGH (ref 80.0–100.0)
Platelets: 96 10*3/uL — ABNORMAL LOW (ref 150–400)
RBC: 2.57 MIL/uL — ABNORMAL LOW (ref 4.22–5.81)
RDW: 21.2 % — ABNORMAL HIGH (ref 11.5–15.5)
WBC: 10.4 10*3/uL (ref 4.0–10.5)
nRBC: 0 % (ref 0.0–0.2)

## 2020-01-20 NOTE — Progress Notes (Signed)
[facility]   PULMONARY CRITICAL CARE SERVICE  PROGRESS NOTE  Date of Service: 01/20/2020  Jose Tucker  ZOX:[id number]  DOB: Sep 01, 1951   DOA: 12/10/2019  Referring Physician: Merton Border, MD  HPI: Jose Tucker is a 69 y.o. male seen for follow up of Acute on Chronic Respiratory Failure.  Patient currently is on pressure support mode has been on an FiO2 of 45% good tidal volumes are noted at this time  Medications: Reviewed on Rounds  Physical Exam:  Vitals: Temperature 98.2 pulse 84 respiratory rate 26 blood pressure is 114/64 saturations 100%  Ventilator Settings mode ventilation pressure support FiO2 45% pressure poor 12 PEEP five tidal volume 400  . General: Comfortable at this time . Eyes: Grossly normal lids, irises & conjunctiva . ENT: grossly tongue is normal . Neck: no obvious mass . Cardiovascular: S1 S2 normal no gallop . Respiratory: Coarse breath sounds with a few scattered rhonchi . Abdomen: soft . Skin: no rash seen on limited exam . Musculoskeletal: not rigid . Psychiatric:unable to assess . Neurologic: no seizure no involuntary movements         Lab Data:   Basic Metabolic Panel: Recent Labs  Lab 01/14/20 0503 01/15/20 0521 01/17/20 0639 01/19/20 0543  NA 132*  --  131* 134*  K 3.4* 3.8 3.5 4.0  CL 94*  --  94* 96*  CO2 25  --  22 24  GLUCOSE 172*  --  109* 108*  BUN 73*  --  52* 43*  CREATININE 3.12*  --  3.09* 3.26*  CALCIUM 8.4*  --  8.4* 8.6*  PHOS 3.5  --  4.4 5.5*    ABG: No results for input(s): PHART, PCO2ART, PO2ART, HCO3, O2SAT in the last 168 hours.  Liver Function Tests: Recent Labs  Lab 01/14/20 0503 01/17/20 0639 01/19/20 0543  ALBUMIN 2.2* 2.3* 2.5*   No results for input(s): LIPASE, AMYLASE in the last 168 hours. No results for input(s): AMMONIA in the last 168 hours.  CBC: Recent Labs  Lab 01/16/20 0535 01/17/20 0639 01/18/20 0536 01/19/20 0543  01/20/20 0808  WBC 9.3 11.8* 9.4 10.9* 10.4  HGB 7.9* 8.0* 8.3* 7.8* 7.9*  HCT 26.2* 26.0* 27.4* 26.5* 26.4*  MCV 100.8* 102.0* 102.6* 103.1* 102.7*  PLT 124* 135* 113* 119* 96*    Cardiac Enzymes: No results for input(s): CKTOTAL, CKMB, CKMBINDEX, TROPONINI in the last 168 hours.  BNP (last 3 results) No results for input(s): BNP in the last 8760 hours.  ProBNP (last 3 results) No results for input(s): PROBNP in the last 8760 hours.  Radiological Exams: No results found.  Assessment/Plan Active Problems:   Acute on chronic respiratory failure with hypoxia (HCC)   Chronic systolic (congestive) heart failure (HCC)   End stage renal disease on dialysis [facility])   Chronic atrial fibrillation (HCC)   Bilateral pleural effusion   1. Acute on chronic respiratory failure with hypoxia continue with pressure support titrate oxygen down as tolerated continue pulmonary toilet. 2. Chronic systolic heart failure compensated at this time we will continue with supportive care 3. End-stage renal disease on hemodialysis 4. Chronic atrial fibrillation rate is controlled we will continue to follow 5. Bilateral pleural effusion no change continue supportive care   I have personally seen and evaluated the patient, evaluated laboratory and imaging results, formulated the assessment and plan and placed orders. The Patient requires high complexity decision making with multiple systems involvement.  Rounds were done with the  Respiratory Therapy Director and Staff therapists and discussed with nursing staff also.  Allyne Gee, MD [facility] Pulmonary Critical Care Medicine Sleep Medicine

## 2020-01-20 NOTE — Progress Notes (Signed)
PROGRESS NOTE    Dontrelle Mazon  RKY:[id number] DOB: [birth year] DOA: 01/02/2020 PCP: Patient, No Pcp Per    Brief Narrative:  Case Vassell is an 69 y.o. male with multiple medical problems including congestive heart failure, valvular heart disease, hypertension, diabetes mellitus, dyslipidemia, obstructive sleep apnea, chronic lower extremity venous stasis ulcers, obesity was admitted to the acute hospital after falling out of bed and lying for 48 hours.  Patient developed rhabdomyolysis and also had renal failure.  He also had atrial fibrillation with RVR.  There was some question of underlying infectious process.  He was found to have elevated troponin on the cardiac work-up.  Echo showed 50-55% EF.  He underwent left heart cath which showed nonobstructive coronary artery disease.  Patient also had end-stage renal disease and nephrology was consulted for hemodialysis.  Repeat echo showed reduced ejection fraction at 35%.  He had some complications in the AV fistula clotted off and had to be removed.  Patient also had profuse bleeding from the site with severe anemia and received 7 units of PRBCs.  Due to the severity of the patient's illness, worsening peripheral vascular disease he developed necrotic toes.  Angiogram showed severe distal right SFA stenosis along with total occlusion of the distal right popliteal, mid left SFA and popliteal disease, diffuse bilateral tibial outflow disease.  Due to the patient's diabetes and other comorbidities plan for lower extremity intervention scheduled for outpatient once his acute issues resolved.  His ongoing congestive heart failure was complicated by severe aortic stenosis.  Consideration for TAVR was done.  He was intubated for procedure but had complicated post intubation.  He developed PEA and respiratory arrest.  He also developed V. tach requiring shock.  He had to be placed on pressors and intubated.  He subsequently developed left thigh hematoma/cellulitis  which was treated with IV antibiotics. Tracheostomy was placed.  PEG tube was placed as well.  Due to his complex medical problems he was transferred to [facility] on 12/18/2019.  He was noted to have worsening leukocytosis therefore had respiratory cultures from here that showed Proteus mirabilis.  On cefepime and Flagyl.  However, he also had diarrhea and stool for C. difficile was positive on 12/26/2019.  He had episode of rapid response/CODE BLUE was called 12/29/19 due to PEA arrest and hypoxemia.  Patient apparently was noted to have unresponsiveness after initiating hemodialysis.  He is currently on 45% FiO2.   Assessment & Plan:   Active Problems:   Acute on chronic respiratory failure with hypoxia (HCC)   Chronic systolic (congestive) heart failure (HCC)   End stage renal disease on dialysis (HCC)   Chronic atrial fibrillation (HCC)   Bilateral pleural effusion Pneumonia with Proteus mirabilis, Carbapenem resistant Klebsiella C. difficile infection Leukocytosis Dysphagia Diabetes mellitus type 2 Status post PEA arrest Moderate to severe aortic stenosis Sacral pressure ulcer unspecified stage  Acute on chronic respiratory failure with hypoxemia: Patient currently ventilator dependent.  He had episode of pulseless electrical activity arrest/CODE BLUE on 12/29/2019 while weaning on the ventilator.  His previous respiratory cultures showed Proteus mirabilis.    He was on treatment with cefepime.  More recent respiratory cultures from 01/05/2020 showed Klebsiella pneumonia, Carbapenem resistant, Proteus mirabilis.  Antibiotic changed to ciprofloxacin. However, he also has Clostridium difficile infection and therefore on oral vancomycin, recommend to continue while on the antibiotic and also for 5 to 7 days after completion of the antibiotic.  Non-ST elevation MI/chronic congestive heart failure: He recently had  PEA arrest and was resuscitated.  Likely secondary to aspiration?   Unfortunately placed back on the ventilator.    He was treated with cefepime and flagyl.  He also has moderate to severe aortic stenosis.    Further medication and management per primary team.  Pneumonia: As mentioned above previous respiratory culture showed Proteus mirabilis for which she was on cefepime.  However, more recent respiratory cultures showing Klebsiella pneumonia Carbapenem resistant, Proteus mirabilis.  Therefore antibiotics switched to ciprofloxacin. He also has dysphagia and high concern for aspiration which places him at a high risk for recurrent pneumonia.  We will plan to treat for duration of total 2 weeks pending improvement.  However, because of his dysphagia and high concern for aspiration he is likely to have worsening respiratory failure despite being on antibiotics secondary to aspiration pneumonia.  Clostridium difficile infection: On oral vancomycin.  Would recommend to continue while on treatment with the antibiotics and also for 5 to 7 days after completion of the antibiotic.  Leukocytosis: Likely secondary to the pneumonia, C. difficile infection.  He also has dysphagia and high concern for ongoing aspiration which could be contributing to the WBC count.  Antibiotics as mentioned above.  Continue to monitor.  End-stage renal disease on dialysis: Nephrology following.  Dialysis per nephrology.  Antibiotics renally dosed.  Diabetes mellitus type 2: Continue to monitor Accu-Cheks, management of diabetes per the primary team.  Dysphagia: As mentioned above, due to his dysphagia he is high risk for worsening respiratory failure and worsening pneumonia secondary to aspiration.  Sacral pressure ulcer unspecified stage: Continue local wound care.  Unfortunately due to his debility is high risk for worsening of the pressure ulcer. Due to his complex medical problems he is high risk for worsening and decompensation.  Plan of care discussed with the patient's family at  bedside and also with the primary team.  Subjective: He remains on 45% FiO2.  He is complaining of some nonspecific abdominal discomfort with nausea.  More recent respiratory culture showing Klebsiella pneumonia, Carbapenem resistant, Proteus mirabilis.  Objective: Temperature 98.2, pulse 84, respiratory 26, blood pressure 114/64, oxygen saturation 100% on 45% FiO2  Examination: General exam: Ill-appearing male, remains on the vent HEENT: Atraumatic and normocephalic, pupils equal and reactive, no ear or nose lesions Neck: Has trach in place Respiratory system: Rhonchi, no wheezing Cardiovascular system: S1 & S2, murmur Gastrointestinal system: Abdomen is soft, mild nonspecific tenderness without any guarding or rebound, positive bowel sounds Central nervous system: Awake, following few commands.  Appears to have debility with generalized weakness Extremities: No lower extremity edema Skin: No rashes Psychiatry: Mood & affect appropriate.     Data Reviewed: I have personally reviewed following labs and imaging studies  CBC: Recent Labs  Lab 01/16/20 0535 01/17/20 0639 01/18/20 0536 01/19/20 0543 01/20/20 0808  WBC 9.3 11.8* 9.4 10.9* 10.4  HGB 7.9* 8.0* 8.3* 7.8* 7.9*  HCT 26.2* 26.0* 27.4* 26.5* 26.4*  MCV 100.8* 102.0* 102.6* 103.1* 102.7*  PLT 124* 135* 113* 119* 96*    Basic Metabolic Panel: Recent Labs  Lab 01/14/20 0503 01/15/20 0521 01/17/20 0639 01/19/20 0543  NA 132*  --  131* 134*  K 3.4* 3.8 3.5 4.0  CL 94*  --  94* 96*  CO2 25  --  22 24  GLUCOSE 172*  --  109* 108*  BUN 73*  --  52* 43*  CREATININE 3.12*  --  3.09* 3.26*  CALCIUM 8.4*  --  8.4* 8.6*  PHOS 3.5  --  4.4 5.5*    GFR: CrCl cannot be calculated (Unknown ideal weight.).  Liver Function Tests: Recent Labs  Lab 01/14/20 0503 01/17/20 0639 01/19/20 0543  ALBUMIN 2.2* 2.3* 2.5*    CBG: No results for input(s): GLUCAP in the last 168 hours.   No results found for this or any  previous visit (from the past 240 hour(s)).   Radiology Studies: No results found.  Scheduled Meds: Please see MAR   Yaakov Guthrie, MD  01/20/2020, 3:03 PM

## 2020-01-21 DIAGNOSIS — I482 Chronic atrial fibrillation, unspecified: Secondary | ICD-10-CM | POA: Diagnosis not present

## 2020-01-21 DIAGNOSIS — J9621 Acute and chronic respiratory failure with hypoxia: Secondary | ICD-10-CM | POA: Diagnosis not present

## 2020-01-21 DIAGNOSIS — J90 Pleural effusion, not elsewhere classified: Secondary | ICD-10-CM | POA: Diagnosis not present

## 2020-01-21 DIAGNOSIS — I5022 Chronic systolic (congestive) heart failure: Secondary | ICD-10-CM | POA: Diagnosis not present

## 2020-01-21 LAB — CBC
HCT: 26.9 % — ABNORMAL LOW (ref 39.0–52.0)
Hemoglobin: 7.8 g/dL — ABNORMAL LOW (ref 13.0–17.0)
MCH: 30.6 pg (ref 26.0–34.0)
MCHC: 29 g/dL — ABNORMAL LOW (ref 30.0–36.0)
MCV: 105.5 fL — ABNORMAL HIGH (ref 80.0–100.0)
Platelets: 87 10*3/uL — ABNORMAL LOW (ref 150–400)
RBC: 2.55 MIL/uL — ABNORMAL LOW (ref 4.22–5.81)
RDW: 21 % — ABNORMAL HIGH (ref 11.5–15.5)
WBC: 12.2 10*3/uL — ABNORMAL HIGH (ref 4.0–10.5)
nRBC: 0.2 % (ref 0.0–0.2)

## 2020-01-21 LAB — RENAL FUNCTION PANEL
Albumin: 2.6 g/dL — ABNORMAL LOW (ref 3.5–5.0)
Anion gap: 15 (ref 5–15)
BUN: 41 mg/dL — ABNORMAL HIGH (ref 8–23)
CO2: 24 mmol/L (ref 22–32)
Calcium: 8.8 mg/dL — ABNORMAL LOW (ref 8.9–10.3)
Chloride: 97 mmol/L — ABNORMAL LOW (ref 98–111)
Creatinine, Ser: 3.18 mg/dL — ABNORMAL HIGH (ref 0.61–1.24)
GFR calc Af Amer: 22 mL/min — ABNORMAL LOW (ref >60)
GFR calc non Af Amer: 19 mL/min — ABNORMAL LOW (ref >60)
Glucose, Bld: 169 mg/dL — ABNORMAL HIGH (ref 70–99)
Phosphorus: 4.4 mg/dL (ref 2.5–4.6)
Potassium: 4 mmol/L (ref 3.5–5.1)
Sodium: 136 mmol/L (ref 135–145)

## 2020-01-21 LAB — HEPATITIS B SURFACE ANTIGEN: Hepatitis B Surface Ag: NONREACTIVE

## 2020-01-21 LAB — HEPATITIS B CORE ANTIBODY, TOTAL: Hep B Core Total Ab: NONREACTIVE

## 2020-01-21 NOTE — Progress Notes (Addendum)
[facility]   PULMONARY CRITICAL CARE SERVICE  PROGRESS NOTE  Date of Service: 01/21/2020  Jose Tucker  XNA:[id number]  DOB: March 20, 1951   DOA: 01/01/2020  Referring Physician: Merton Border, MD  HPI: Jose Tucker is a 69 y.o. male seen for follow up of Acute on Chronic Respiratory Failure.  Patient is currently working on a 4-hour goal of aerosol trach collar 45% FiO2 satting well at this time no distress.  Medications: Reviewed on Rounds  Physical Exam:  Vitals: Pulse 92 respirations 28 BP 101/40 O2 sat 99% temp 98.5  Ventilator Settings ATC 45%  . General: Comfortable at this time . Eyes: Grossly normal lids, irises & conjunctiva . ENT: grossly tongue is normal . Neck: no obvious mass . Cardiovascular: S1 S2 normal no gallop . Respiratory: No rales or rhonchi noted . Abdomen: soft . Skin: no rash seen on limited exam . Musculoskeletal: not rigid . Psychiatric:unable to assess . Neurologic: no seizure no involuntary movements         Lab Data:   Basic Metabolic Panel: Recent Labs  Lab 01/15/20 0521 01/17/20 0639 01/19/20 0543 01/21/20 0520  NA  --  131* 134* 136  K 3.8 3.5 4.0 4.0  CL  --  94* 96* 97*  CO2  --  22 24 24   GLUCOSE  --  109* 108* 169*  BUN  --  52* 43* 41*  CREATININE  --  3.09* 3.26* 3.18*  CALCIUM  --  8.4* 8.6* 8.8*  PHOS  --  4.4 5.5* 4.4    ABG: No results for input(s): PHART, PCO2ART, PO2ART, HCO3, O2SAT in the last 168 hours.  Liver Function Tests: Recent Labs  Lab 01/17/20 0639 01/19/20 0543 01/21/20 0520  ALBUMIN 2.3* 2.5* 2.6*   No results for input(s): LIPASE, AMYLASE in the last 168 hours. No results for input(s): AMMONIA in the last 168 hours.  CBC: Recent Labs  Lab 01/17/20 0639 01/18/20 0536 01/19/20 0543 01/20/20 0808 01/21/20 0744  WBC 11.8* 9.4 10.9* 10.4 12.2*  HGB 8.0* 8.3* 7.8* 7.9* 7.8*  HCT 26.0* 27.4* 26.5* 26.4* 26.9*  MCV 102.0* 102.6* 103.1*  102.7* 105.5*  PLT 135* 113* 119* 96* 87*    Cardiac Enzymes: No results for input(s): CKTOTAL, CKMB, CKMBINDEX, TROPONINI in the last 168 hours.  BNP (last 3 results) No results for input(s): BNP in the last 8760 hours.  ProBNP (last 3 results) No results for input(s): PROBNP in the last 8760 hours.  Radiological Exams: No results found.  Assessment/Plan Active Problems:   Acute on chronic respiratory failure with hypoxia (HCC)   Chronic systolic (congestive) heart failure (HCC)   End stage renal disease on dialysis [facility])   Chronic atrial fibrillation (HCC)   Bilateral pleural effusion   1. Acute on chronic respiratory failure with hypoxia continue with pressure support titrate oxygen down as tolerated continue pulmonary toilet. 2. Chronic systolic heart failure compensated at this time we will continue with supportive care 3. End-stage renal disease on hemodialysis 4. Chronic atrial fibrillation rate is controlled we will continue to follow 5. Bilateral pleural effusion no change continue supportive care    I have personally seen and evaluated the patient, evaluated laboratory and imaging results, formulated the assessment and plan and placed orders. The Patient requires high complexity decision making with multiple systems involvement.  Rounds were done with the Respiratory Therapy Director and Staff therapists and discussed with nursing staff also.  Allyne Gee, MD  [facility] Pulmonary Critical Care Medicine Sleep Medicine

## 2020-01-21 NOTE — Progress Notes (Signed)
[facility]  ROUNDING NOTE   Subjective:  Patient due for hemodialysis treatment again later today. Creatinine currently 3.18 with an EGFR of 19. Patient remains dialysis dependent. Hemoglobin currently 7.9.  Objective:  Vital signs in last 24 hours:  Temperature 97.5 pulse 92 respirations 28 blood pressure 101/40  Physical Exam: General: Critically ill-appearing  Head: Normocephalic, atraumatic. Moist oral mucosal membranes  Eyes: Anicteric  Neck: Tracheostomy in place  Lungs:  Scattered rhonchi bilateral, vent assisted  Heart: S1S2 irregular  Abdomen:  Soft, nontender, bowel sounds present  Extremities: trace peripheral edema.  Neurologic: Awake, alert, following commands  Skin: Bilateral upper extremity ecchymoses  Access: Right IJ PermCath    Basic Metabolic Panel: Recent Labs  Lab 01/15/20 0521 01/17/20 0639 01/19/20 0543 01/21/20 0520  NA  --  131* 134* 136  K 3.8 3.5 4.0 4.0  CL  --  94* 96* 97*  CO2  --  _0 GLUCOSE  --  109* 108* 169*  BUN  --  52* 43* 41*  CREATININE  --  3.09* 3.26* 3.18*  CALCIUM  --  8.4* 8.6* 8.8*  PHOS  --  4.4 5.5* 4.4    Liver Function Tests: Recent Labs  Lab 01/17/20 0639 01/19/20 0543 01/21/20 0520  ALBUMIN 2.3* 2.5* 2.6*   No results for input(s): LIPASE, AMYLASE in the last 168 hours. No results for input(s): AMMONIA in the last 168 hours.  CBC: Recent Labs  Lab 01/16/20 0535 01/17/20 0639 01/18/20 0536 01/19/20 0543 01/20/20 0808  WBC 9.3 11.8* 9.4 10.9* 10.4  HGB 7.9* 8.0* 8.3* 7.8* 7.9*  HCT 26.2* 26.0* 27.4* 26.5* 26.4*  MCV 100.8* 102.0* 102.6* 103.1* 102.7*  PLT 124* 135* 113* 119* 96*    Cardiac Enzymes: No results for input(s): CKTOTAL, CKMB, CKMBINDEX, TROPONINI in the last 168 hours.  BNP: Invalid input(s): POCBNP  CBG: No results for input(s): GLUCAP in the last 168 hours.  Microbiology: Results for orders placed or performed during the hospital encounter of 12/06/2019   C difficile quick scan w PCR reflex     Status: None   Collection Time: 12/18/19  4:49 AM   Specimen: STOOL  Result Value Ref Range Status   C Diff antigen NEGATIVE NEGATIVE Final   C Diff toxin NEGATIVE NEGATIVE Final   C Diff interpretation No C. difficile detected.  Corrected    Comment: Performed at [facility], [place] [address] [place], [place] [address] CORRECTED ON 03/13 AT 1510: PREVIOUSLY REPORTED AS VALID   Culture, respiratory (non-expectorated)     Status: None   Collection Time: 12/24/19  4:00 PM   Specimen: Tracheal Aspirate; Respiratory  Result Value Ref Range Status   Specimen Description TRACHEAL ASPIRATE  Final   Special Requests NONE  Final   Gram Stain   Final    ABUNDANT WBC PRESENT, PREDOMINANTLY PMN ABUNDANT GRAM POSITIVE RODS FEW GRAM NEGATIVE RODS Performed at [facility], [place] [address], [place], [place] [address]    Culture MODERATE PROTEUS MIRABILIS  Final   Report Status 12/26/2019 FINAL  Final   Organism ID, Bacteria PROTEUS MIRABILIS  Final      Susceptibility   Proteus mirabilis - MIC*    AMPICILLIN <=2 SENSITIVE Sensitive     CEFAZOLIN 8 SENSITIVE Sensitive     CEFEPIME <=0.12 SENSITIVE Sensitive     CEFTAZIDIME <=1 SENSITIVE Sensitive     CEFTRIAXONE <=0.25 SENSITIVE Sensitive     CIPROFLOXACIN <=0.25 SENSITIVE  Sensitive     GENTAMICIN <=1 SENSITIVE Sensitive     IMIPENEM 4 SENSITIVE Sensitive     TRIMETH/SULFA <=20 SENSITIVE Sensitive     AMPICILLIN/SULBACTAM <=2 SENSITIVE Sensitive     PIP/TAZO <=4 SENSITIVE Sensitive     * MODERATE PROTEUS MIRABILIS  Culture, blood (routine x 2)     Status: None   Collection Time: 12/25/19  5:16 PM   Specimen: BLOOD  Result Value Ref Range Status   Specimen Description BLOOD RIGHT ANTECUBITAL  Final   Special Requests   Final    BOTTLES DRAWN AEROBIC AND ANAEROBIC Blood Culture adequate volume   Culture   Final    NO GROWTH 5 DAYS Performed at [facility], [place] [address],  [place], [place] [address]    Report Status 12/30/2019 FINAL  Final  Culture, blood (routine x 2)     Status: None   Collection Time: 12/25/19  5:18 PM   Specimen: BLOOD  Result Value Ref Range Status   Specimen Description BLOOD RIGHT ANTECUBITAL  Final   Special Requests   Final    BOTTLES DRAWN AEROBIC AND ANAEROBIC Blood Culture adequate volume   Culture   Final    NO GROWTH 5 DAYS Performed at [facility], [place] [address], [place], [place] [address]    Report Status 12/30/2019 FINAL  Final  C difficile quick scan w PCR reflex     Status: Abnormal   Collection Time: 12/26/19 12:11 PM   Specimen: STOOL  Result Value Ref Range Status   C Diff antigen POSITIVE (A) NEGATIVE Final   C Diff toxin POSITIVE (A) NEGATIVE Final    Comment: CRITICAL RESULT CALLED TO, READ BACK BY AND VERIFIED WITH:  RN CHRISSY R. _0  12/26/2019 AKT    C Diff interpretation Toxin producing C. difficile detected.  Final  Culture, blood (routine x 2)     Status: None   Collection Time: 01/05/20 12:43 PM   Specimen: BLOOD  Result Value Ref Range Status   Specimen Description BLOOD RIGHT ANTECUBITAL  Final   Special Requests   Final    BOTTLES DRAWN AEROBIC AND ANAEROBIC Blood Culture adequate volume   Culture   Final    NO GROWTH 5 DAYS Performed at [facility], [address] [place], [place] [address]    Report Status 01/10/2020 FINAL  Final  Culture, respiratory (non-expectorated)     Status: None   Collection Time: 01/05/20  2:55 PM   Specimen: Tracheal Aspirate; Respiratory  Result Value Ref Range Status   Specimen Description TRACHEAL ASPIRATE  Final   Special Requests NONE  Final   Gram Stain   Final    ABUNDANT WBC PRESENT,BOTH PMN AND MONONUCLEAR ABUNDANT GRAM NEGATIVE RODS Performed at [facility], [place] [address], [place], [place] [address]    Culture   Final    ABUNDANT KLEBSIELLA PNEUMONIAE MODERATE PROTEUS MIRABILIS KLEBSIELLA PNEUMONIAE CONFIRMED CARBAPENEMASE  RESISTANT ENTEROBACTERIACAE KLEBSIELLA PNEUMONIAE Confirmed Extended Spectrum Beta-Lactamase Producer (ESBL).  In bloodstream infections from ESBL organisms, carbapenems are preferred over piperacillin/tazobactam. They are shown to have a lower risk of mortality. KLEBSIELLA PNEUMONIAE MULTI-DRUG RESISTANT ORGANISM    Report Status 01/11/2020 FINAL  Final   Organism ID, Bacteria PROTEUS MIRABILIS  Final   Organism ID, Bacteria KLEBSIELLA PNEUMONIAE  Final      Susceptibility   Klebsiella pneumoniae - MIC*    AMPICILLIN >=32 RESISTANT Resistant     CEFAZOLIN >=64 RESISTANT  Resistant     CEFEPIME >=32 RESISTANT Resistant     CEFTAZIDIME >=64 RESISTANT Resistant     CEFTRIAXONE >=64 RESISTANT Resistant     CIPROFLOXACIN <=0.25 SENSITIVE Sensitive     GENTAMICIN >=16 RESISTANT Resistant     IMIPENEM >=16 RESISTANT Resistant     TRIMETH/SULFA >=320 RESISTANT Resistant     AMPICILLIN/SULBACTAM >=32 RESISTANT Resistant     PIP/TAZO >=128 RESISTANT Resistant     * ABUNDANT KLEBSIELLA PNEUMONIAE   Proteus mirabilis - MIC*    AMPICILLIN <=2 SENSITIVE Sensitive     CEFAZOLIN <=4 SENSITIVE Sensitive     CEFEPIME <=0.12 SENSITIVE Sensitive     CEFTAZIDIME <=1 SENSITIVE Sensitive     CEFTRIAXONE <=0.25 SENSITIVE Sensitive     CIPROFLOXACIN <=0.25 SENSITIVE Sensitive     GENTAMICIN <=1 SENSITIVE Sensitive     IMIPENEM 8 INTERMEDIATE Intermediate     TRIMETH/SULFA <=20 SENSITIVE Sensitive     AMPICILLIN/SULBACTAM <=2 SENSITIVE Sensitive     PIP/TAZO <=4 SENSITIVE Sensitive     * MODERATE PROTEUS MIRABILIS  Culture, blood (routine x 2)     Status: None   Collection Time: 01/05/20  3:04 PM   Specimen: BLOOD  Result Value Ref Range Status   Specimen Description BLOOD RIGHT ANTECUBITAL  Final   Special Requests   Final    BOTTLES DRAWN AEROBIC ONLY Blood Culture adequate volume   Culture   Final    NO GROWTH 5 DAYS Performed at [facility], [address], [place], [place] [address]     Report Status 01/10/2020 FINAL  Final  Carbapenem Resistance Panel     Status: Abnormal   Collection Time: 01/05/20  3:08 PM  Result Value Ref Range Status   Carba Resistance IMP Gene NOT DETECTED NOT DETECTED Final   Carba Resistance VIM Gene NOT DETECTED NOT DETECTED Final   Carba Resistance NDM Gene NOT DETECTED NOT DETECTED Final   Carba Resistance KPC Gene DETECTED (A) NOT DETECTED Final    Comment: CRITICAL RESULT CALLED TO, READ BACK BY AND VERIFIED WITH: RBV RN D. Fara Olden [id number] FCP    Carba Resistance OXA48 Gene NOT DETECTED NOT DETECTED Final    Comment: (NOTE) Cepheid Carba-R is an FDA-cleared nucleic acid amplification test  (NAAT)for the detection and differentiation of genes encoding the  most prevalent carbapenemases in bacterial isolate samples. Carbapenemase gene identification and implementation of comprehensive  infection control measures are recommended by the CDC to prevent the  spread of the resistant organisms. Performed at [facility], [place] [address] [place], [place] [address]     Coagulation Studies: No results for input(s): LABPROT, INR in the last 72 hours.  Urinalysis: No results for input(s): COLORURINE, LABSPEC, PHURINE, GLUCOSEU, HGBUR, BILIRUBINUR, KETONESUR, PROTEINUR, UROBILINOGEN, NITRITE, LEUKOCYTESUR in the last 72 hours.  Invalid input(s): APPERANCEUR    Imaging: No results found.   Medications:     iohexol  Assessment/ Plan:  69 y.o. male with a PMHx of ESRD on HD, CHF, aortic stenosis, hypertension, diabetes mellitus type 2, hyperlipidemia, obstructive sleep apnea, chronic venous stasis with LE edema, and obesity who was admitted to [facility] on 12/14/2019 for ongoing treatment of respiratory failure, ESRD, malnutrition, and generalized debility.    1.  ESRD on HD MWF.    Patient continues on MWF dialysis schedule.  He will be due for treatment today.  Volume status acceptable.  2.  Acute respiratory failure.    Patient  with ventilator dependent  respiratory failure.  Pulmonary/critical care following.  3.  Secondary hyperparathyroidism.    Phosphorus down to 4.4.  Continue to monitor.  4.  Anemia of chronic kidney disease.  Hemoglobin up slightly to 7.9.  Maintain the patient on Retacrit 4000 units IV with dialysis.    LOS: 0 Rajendra Spiller 4/16/20218:35 AM

## 2020-01-22 DIAGNOSIS — I482 Chronic atrial fibrillation, unspecified: Secondary | ICD-10-CM | POA: Diagnosis not present

## 2020-01-22 DIAGNOSIS — J90 Pleural effusion, not elsewhere classified: Secondary | ICD-10-CM | POA: Diagnosis not present

## 2020-01-22 DIAGNOSIS — J9621 Acute and chronic respiratory failure with hypoxia: Secondary | ICD-10-CM | POA: Diagnosis not present

## 2020-01-22 DIAGNOSIS — I5022 Chronic systolic (congestive) heart failure: Secondary | ICD-10-CM | POA: Diagnosis not present

## 2020-01-22 LAB — HEPATITIS B SURFACE ANTIBODY, QUANTITATIVE: Hep B S AB Quant (Post): 18.8 m[IU]/mL (ref >9.9)

## 2020-01-22 NOTE — Progress Notes (Signed)
Pulmonary [place]   PULMONARY CRITICAL CARE SERVICE  PROGRESS NOTE  Date of Service: 01/22/2020  Jose Tucker  ENI:[id number]  DOB: June 04, 1951   DOA: 12/20/2019  Referring Physician: Merton Border, MD  HPI: Jose Tucker is a 69 y.o. male seen for follow up of Acute on Chronic Respiratory Failure.  Patient at this time is on pressure support has been on 40% FiO2 with pressure support of 12/5  Medications: Reviewed on Rounds  Physical Exam:  Vitals: Temperature 96.1 pulse 80 respiratory rate 19 blood pressure is 111/62 saturations 97%  Ventilator Settings on pressure support FiO2 40% pressure 12 PEEP 5  . General: Comfortable at this time . Eyes: Grossly normal lids, irises & conjunctiva . ENT: grossly tongue is normal . Neck: no obvious mass . Cardiovascular: S1 S2 normal no gallop . Respiratory: Coarse rhonchi expansion is equal at this time . Abdomen: soft . Skin: no rash seen on limited exam . Musculoskeletal: not rigid . Psychiatric:unable to assess . Neurologic: no seizure no involuntary movements         Lab Data:   Basic Metabolic Panel: Recent Labs  Lab 01/17/20 0639 01/19/20 0543 01/21/20 0520  NA 131* 134* 136  K 3.5 4.0 4.0  CL 94* 96* 97*  CO2 22 24 24   GLUCOSE 109* 108* 169*  BUN 52* 43* 41*  CREATININE 3.09* 3.26* 3.18*  CALCIUM 8.4* 8.6* 8.8*  PHOS 4.4 5.5* 4.4    ABG: No results for input(s): PHART, PCO2ART, PO2ART, HCO3, O2SAT in the last 168 hours.  Liver Function Tests: Recent Labs  Lab 01/17/20 0639 01/19/20 0543 01/21/20 0520  ALBUMIN 2.3* 2.5* 2.6*   No results for input(s): LIPASE, AMYLASE in the last 168 hours. No results for input(s): AMMONIA in the last 168 hours.  CBC: Recent Labs  Lab 01/17/20 0639 01/18/20 0536 01/19/20 0543 01/20/20 0808 01/21/20 0744  WBC 11.8* 9.4 10.9* 10.4 12.2*  HGB 8.0* 8.3* 7.8* 7.9* 7.8*  HCT 26.0* 27.4* 26.5* 26.4* 26.9*  MCV 102.0* 102.6*  103.1* 102.7* 105.5*  PLT 135* 113* 119* 96* 87*    Cardiac Enzymes: No results for input(s): CKTOTAL, CKMB, CKMBINDEX, TROPONINI in the last 168 hours.  BNP (last 3 results) No results for input(s): BNP in the last 8760 hours.  ProBNP (last 3 results) No results for input(s): PROBNP in the last 8760 hours.  Radiological Exams: No results found.  Assessment/Plan Active Problems:   Acute on chronic respiratory failure with hypoxia (HCC)   Chronic systolic (congestive) heart failure (HCC)   End stage renal disease on dialysis [facility])   Chronic atrial fibrillation (HCC)   Bilateral pleural effusion   1. Acute on chronic respiratory failure with hypoxia patient currently is on pressure support has been on 40% FiO2 right now on pressure support of 12 PEEP 5 continue to advance as tolerated. 2. Chronic systolic heart failure compensated we will continue with present management 3. End-stage renal failure on dialysis being followed by nephrology 4. Chronic atrial fibrillation rate is controlled continue present management 5. Bilateral pleural effusions following with x-rays   I have personally seen and evaluated the patient, evaluated laboratory and imaging results, formulated the assessment and plan and placed orders. The Patient requires high complexity decision making with multiple systems involvement.  Rounds were done with the Respiratory Therapy Director and Staff therapists and discussed with nursing staff also.  Allyne Gee, MD [facility] Pulmonary Critical Care Medicine Sleep Medicine

## 2020-01-23 DIAGNOSIS — I5022 Chronic systolic (congestive) heart failure: Secondary | ICD-10-CM | POA: Diagnosis not present

## 2020-01-23 DIAGNOSIS — I482 Chronic atrial fibrillation, unspecified: Secondary | ICD-10-CM | POA: Diagnosis not present

## 2020-01-23 DIAGNOSIS — J90 Pleural effusion, not elsewhere classified: Secondary | ICD-10-CM | POA: Diagnosis not present

## 2020-01-23 DIAGNOSIS — J9621 Acute and chronic respiratory failure with hypoxia: Secondary | ICD-10-CM | POA: Diagnosis not present

## 2020-01-23 NOTE — Progress Notes (Signed)
Pulmonary [place]   PULMONARY CRITICAL CARE SERVICE  PROGRESS NOTE  Date of Service: 01/23/2020  Jose Tucker  EZM:[id number]  DOB: Sep 24, 1951   DOA: 12/17/2019  Referring Physician: Merton Border, MD  HPI: Jose Tucker is a 69 y.o. male seen for follow up of Acute on Chronic Respiratory Failure.  Patient currently is on pressure support mode has been on 40% FiO2 was attempted on T collar today but did not tolerate  Medications: Reviewed on Rounds  Physical Exam:  Vitals: Temperature 96.6 pulse 79 respiratory 15 blood pressure 99/62 saturations 98%  Ventilator Settings on pressure support FiO2 40% pressure poor 12/5  . General: Comfortable at this time . Eyes: Grossly normal lids, irises & conjunctiva . ENT: grossly tongue is normal . Neck: no obvious mass . Cardiovascular: S1 S2 normal no gallop . Respiratory: No rhonchi no rales are noted at this time . Abdomen: soft . Skin: no rash seen on limited exam . Musculoskeletal: not rigid . Psychiatric:unable to assess . Neurologic: no seizure no involuntary movements         Lab Data:   Basic Metabolic Panel: Recent Labs  Lab 01/17/20 0639 01/19/20 0543 01/21/20 0520  NA 131* 134* 136  K 3.5 4.0 4.0  CL 94* 96* 97*  CO2 22 24 24   GLUCOSE 109* 108* 169*  BUN 52* 43* 41*  CREATININE 3.09* 3.26* 3.18*  CALCIUM 8.4* 8.6* 8.8*  PHOS 4.4 5.5* 4.4    ABG: No results for input(s): PHART, PCO2ART, PO2ART, HCO3, O2SAT in the last 168 hours.  Liver Function Tests: Recent Labs  Lab 01/17/20 0639 01/19/20 0543 01/21/20 0520  ALBUMIN 2.3* 2.5* 2.6*   No results for input(s): LIPASE, AMYLASE in the last 168 hours. No results for input(s): AMMONIA in the last 168 hours.  CBC: Recent Labs  Lab 01/17/20 0639 01/18/20 0536 01/19/20 0543 01/20/20 0808 01/21/20 0744  WBC 11.8* 9.4 10.9* 10.4 12.2*  HGB 8.0* 8.3* 7.8* 7.9* 7.8*  HCT 26.0* 27.4* 26.5* 26.4* 26.9*  MCV  102.0* 102.6* 103.1* 102.7* 105.5*  PLT 135* 113* 119* 96* 87*    Cardiac Enzymes: No results for input(s): CKTOTAL, CKMB, CKMBINDEX, TROPONINI in the last 168 hours.  BNP (last 3 results) No results for input(s): BNP in the last 8760 hours.  ProBNP (last 3 results) No results for input(s): PROBNP in the last 8760 hours.  Radiological Exams: No results found.  Assessment/Plan Active Problems:   Acute on chronic respiratory failure with hypoxia (HCC)   Chronic systolic (congestive) heart failure (HCC)   End stage renal disease on dialysis [facility])   Chronic atrial fibrillation (HCC)   Bilateral pleural effusion   1. Acute on chronic respiratory failure with hypoxia we will continue with pressure support as tolerated currently on 12/5 with good volumes.  Patient was attempted on T collar as noted did not tolerate we will have respiratory therapy reassess again later today 2. Chronic systolic heart failure needs to optimize fluid status being followed by nephrology for dialysis 3. End-stage renal disease on hemodialysis 4. Chronic atrial fibrillation rate controlled 5. Bilateral effusions continue to monitor with x-ray follow-up   I have personally seen and evaluated the patient, evaluated laboratory and imaging results, formulated the assessment and plan and placed orders. The Patient requires high complexity decision making with multiple systems involvement.  Rounds were done with the Respiratory Therapy Director and Staff therapists and discussed with nursing staff also.  Allyne Gee,  MD [facility] Pulmonary Critical Care Medicine Sleep Medicine

## 2020-01-24 DIAGNOSIS — I482 Chronic atrial fibrillation, unspecified: Secondary | ICD-10-CM | POA: Diagnosis not present

## 2020-01-24 DIAGNOSIS — J9621 Acute and chronic respiratory failure with hypoxia: Secondary | ICD-10-CM | POA: Diagnosis not present

## 2020-01-24 DIAGNOSIS — J90 Pleural effusion, not elsewhere classified: Secondary | ICD-10-CM | POA: Diagnosis not present

## 2020-01-24 DIAGNOSIS — I5022 Chronic systolic (congestive) heart failure: Secondary | ICD-10-CM | POA: Diagnosis not present

## 2020-01-24 LAB — RENAL FUNCTION PANEL
Albumin: 2.5 g/dL — ABNORMAL LOW (ref 3.5–5.0)
Anion gap: 13 (ref 5–15)
BUN: 55 mg/dL — ABNORMAL HIGH (ref 8–23)
CO2: 26 mmol/L (ref 22–32)
Calcium: 8.9 mg/dL (ref 8.9–10.3)
Chloride: 94 mmol/L — ABNORMAL LOW (ref 98–111)
Creatinine, Ser: 3.66 mg/dL — ABNORMAL HIGH (ref 0.61–1.24)
GFR calc Af Amer: 19 mL/min — ABNORMAL LOW (ref >60)
GFR calc non Af Amer: 16 mL/min — ABNORMAL LOW (ref >60)
Glucose, Bld: 120 mg/dL — ABNORMAL HIGH (ref 70–99)
Phosphorus: 4.3 mg/dL (ref 2.5–4.6)
Potassium: 3.7 mmol/L (ref 3.5–5.1)
Sodium: 133 mmol/L — ABNORMAL LOW (ref 135–145)

## 2020-01-24 LAB — CBC
HCT: 28.9 % — ABNORMAL LOW (ref 39.0–52.0)
Hemoglobin: 8.4 g/dL — ABNORMAL LOW (ref 13.0–17.0)
MCH: 31 pg (ref 26.0–34.0)
MCHC: 29.1 g/dL — ABNORMAL LOW (ref 30.0–36.0)
MCV: 106.6 fL — ABNORMAL HIGH (ref 80.0–100.0)
Platelets: 69 10*3/uL — ABNORMAL LOW (ref 150–400)
RBC: 2.71 MIL/uL — ABNORMAL LOW (ref 4.22–5.81)
RDW: 21.2 % — ABNORMAL HIGH (ref 11.5–15.5)
WBC: 15.4 10*3/uL — ABNORMAL HIGH (ref 4.0–10.5)
nRBC: 0.3 % — ABNORMAL HIGH (ref 0.0–0.2)

## 2020-01-24 NOTE — Progress Notes (Signed)
[facility]  ROUNDING NOTE   Subjective:  Patient resting in bed. Due for dialysis later today. Does not appear to be in any acute distress at the moment.  Objective:  Vital signs in last 24 hours:  Temperature 97.3 pulse 72 respirations 19 blood pressure 145/44  Physical Exam: General: Critically ill-appearing  Head: Normocephalic, atraumatic. Moist oral mucosal membranes  Eyes: Anicteric  Neck: Tracheostomy in place  Lungs:  Scattered rhonchi bilateral, vent assisted  Heart: S1S2 irregular  Abdomen:  Soft, nontender, bowel sounds present  Extremities: trace peripheral edema.  Neurologic: Awake, alert, following commands  Skin: Bilateral upper extremity ecchymoses  Access: Right IJ PermCath    Basic Metabolic Panel: Recent Labs  Lab 01/19/20 0543 01/21/20 0520  NA 134* 136  K 4.0 4.0  CL 96* 97*  CO2 24 24  GLUCOSE 108* 169*  BUN 43* 41*  CREATININE 3.26* 3.18*  CALCIUM 8.6* 8.8*  PHOS 5.5* 4.4    Liver Function Tests: Recent Labs  Lab 01/19/20 0543 01/21/20 0520  ALBUMIN 2.5* 2.6*   No results for input(s): LIPASE, AMYLASE in the last 168 hours. No results for input(s): AMMONIA in the last 168 hours.  CBC: Recent Labs  Lab 01/18/20 0536 01/19/20 0543 01/20/20 0808 01/21/20 0744  WBC 9.4 10.9* 10.4 12.2*  HGB 8.3* 7.8* 7.9* 7.8*  HCT 27.4* 26.5* 26.4* 26.9*  MCV 102.6* 103.1* 102.7* 105.5*  PLT 113* 119* 96* 87*    Cardiac Enzymes: No results for input(s): CKTOTAL, CKMB, CKMBINDEX, TROPONINI in the last 168 hours.  BNP: Invalid input(s): POCBNP  CBG: No results for input(s): GLUCAP in the last 168 hours.  Microbiology: Results for orders placed or performed during the hospital encounter of 12/29/2019  C difficile quick scan w PCR reflex     Status: None   Collection Time: 12/18/19  4:49 AM   Specimen: STOOL  Result Value Ref Range Status   C Diff antigen NEGATIVE NEGATIVE Final   C Diff toxin NEGATIVE NEGATIVE Final   C  Diff interpretation No C. difficile detected.  Corrected    Comment: Performed at [facility], [place] [address] [place], [place] [address] CORRECTED ON 03/13 AT 1510: PREVIOUSLY REPORTED AS VALID   Culture, respiratory (non-expectorated)     Status: None   Collection Time: 12/24/19  4:00 PM   Specimen: Tracheal Aspirate; Respiratory  Result Value Ref Range Status   Specimen Description TRACHEAL ASPIRATE  Final   Special Requests NONE  Final   Gram Stain   Final    ABUNDANT WBC PRESENT, PREDOMINANTLY PMN ABUNDANT GRAM POSITIVE RODS FEW GRAM NEGATIVE RODS Performed at [facility], [place] [address] [place], [place] [address]    Culture MODERATE PROTEUS MIRABILIS  Final   Report Status 12/26/2019 FINAL  Final   Organism ID, Bacteria PROTEUS MIRABILIS  Final      Susceptibility   Proteus mirabilis - MIC*    AMPICILLIN <=2 SENSITIVE Sensitive     CEFAZOLIN 8 SENSITIVE Sensitive     CEFEPIME <=0.12 SENSITIVE Sensitive     CEFTAZIDIME <=1 SENSITIVE Sensitive     CEFTRIAXONE <=0.25 SENSITIVE Sensitive     CIPROFLOXACIN <=0.25 SENSITIVE Sensitive     GENTAMICIN <=1 SENSITIVE Sensitive     IMIPENEM 4 SENSITIVE Sensitive     TRIMETH/SULFA <=20 SENSITIVE Sensitive     AMPICILLIN/SULBACTAM <=2 SENSITIVE Sensitive     PIP/TAZO <=4 SENSITIVE Sensitive     * MODERATE PROTEUS MIRABILIS  Culture, blood (routine x 2)     Status: None   Collection Time: 12/25/19  5:16 PM   Specimen: BLOOD  Result Value Ref Range Status   Specimen Description BLOOD RIGHT ANTECUBITAL  Final   Special Requests   Final    BOTTLES DRAWN AEROBIC AND ANAEROBIC Blood Culture adequate volume   Culture   Final    NO GROWTH 5 DAYS Performed at [facility], [address], [place], [place] [address]    Report Status 12/30/2019 FINAL  Final  Culture, blood (routine x 2)     Status: None   Collection Time: 12/25/19  5:18 PM   Specimen: BLOOD  Result Value Ref Range Status   Specimen Description BLOOD  RIGHT ANTECUBITAL  Final   Special Requests   Final    BOTTLES DRAWN AEROBIC AND ANAEROBIC Blood Culture adequate volume   Culture   Final    NO GROWTH 5 DAYS Performed at [facility], [place] [address] [place], [place] [address]    Report Status 12/30/2019 FINAL  Final  C difficile quick scan w PCR reflex     Status: Abnormal   Collection Time: 12/26/19 12:11 PM   Specimen: STOOL  Result Value Ref Range Status   C Diff antigen POSITIVE (A) NEGATIVE Final   C Diff toxin POSITIVE (A) NEGATIVE Final    Comment: CRITICAL RESULT CALLED TO, READ BACK BY AND VERIFIED WITH:  RN CHRISSY R. @1817  12/26/2019 AKT    C Diff interpretation Toxin producing C. difficile detected.  Final  Culture, blood (routine x 2)     Status: None   Collection Time: 01/05/20 12:43 PM   Specimen: BLOOD  Result Value Ref Range Status   Specimen Description BLOOD RIGHT ANTECUBITAL  Final   Special Requests   Final    BOTTLES DRAWN AEROBIC AND ANAEROBIC Blood Culture adequate volume   Culture   Final    NO GROWTH 5 DAYS Performed at [facility], [address] [place], [place] [address]    Report Status 01/10/2020 FINAL  Final  Culture, respiratory (non-expectorated)     Status: None   Collection Time: 01/05/20  2:55 PM   Specimen: Tracheal Aspirate; Respiratory  Result Value Ref Range Status   Specimen Description TRACHEAL ASPIRATE  Final   Special Requests NONE  Final   Gram Stain   Final    ABUNDANT WBC PRESENT,BOTH PMN AND MONONUCLEAR ABUNDANT GRAM NEGATIVE RODS Performed at [facility], [place] [address] [place], [place] [address]    Culture   Final    ABUNDANT KLEBSIELLA PNEUMONIAE MODERATE PROTEUS MIRABILIS KLEBSIELLA PNEUMONIAE CONFIRMED CARBAPENEMASE RESISTANT ENTEROBACTERIACAE KLEBSIELLA PNEUMONIAE Confirmed Extended Spectrum Beta-Lactamase Producer (ESBL).  In bloodstream infections from ESBL organisms, carbapenems are preferred over piperacillin/tazobactam. They are shown to  have a lower risk of mortality. KLEBSIELLA PNEUMONIAE MULTI-DRUG RESISTANT ORGANISM    Report Status 01/11/2020 FINAL  Final   Organism ID, Bacteria PROTEUS MIRABILIS  Final   Organism ID, Bacteria KLEBSIELLA PNEUMONIAE  Final      Susceptibility   Klebsiella pneumoniae - MIC*    AMPICILLIN >=32 RESISTANT Resistant     CEFAZOLIN >=64 RESISTANT Resistant     CEFEPIME >=32 RESISTANT Resistant     CEFTAZIDIME >=64 RESISTANT Resistant     CEFTRIAXONE >=64 RESISTANT Resistant     CIPROFLOXACIN <=0.25 SENSITIVE Sensitive     GENTAMICIN >=16 RESISTANT Resistant     IMIPENEM >=16 RESISTANT Resistant  TRIMETH/SULFA >=320 RESISTANT Resistant     AMPICILLIN/SULBACTAM >=32 RESISTANT Resistant     PIP/TAZO >=128 RESISTANT Resistant     * ABUNDANT KLEBSIELLA PNEUMONIAE   Proteus mirabilis - MIC*    AMPICILLIN <=2 SENSITIVE Sensitive     CEFAZOLIN <=4 SENSITIVE Sensitive     CEFEPIME <=0.12 SENSITIVE Sensitive     CEFTAZIDIME <=1 SENSITIVE Sensitive     CEFTRIAXONE <=0.25 SENSITIVE Sensitive     CIPROFLOXACIN <=0.25 SENSITIVE Sensitive     GENTAMICIN <=1 SENSITIVE Sensitive     IMIPENEM 8 INTERMEDIATE Intermediate     TRIMETH/SULFA <=20 SENSITIVE Sensitive     AMPICILLIN/SULBACTAM <=2 SENSITIVE Sensitive     PIP/TAZO <=4 SENSITIVE Sensitive     * MODERATE PROTEUS MIRABILIS  Culture, blood (routine x 2)     Status: None   Collection Time: 01/05/20  3:04 PM   Specimen: BLOOD  Result Value Ref Range Status   Specimen Description BLOOD RIGHT ANTECUBITAL  Final   Special Requests   Final    BOTTLES DRAWN AEROBIC ONLY Blood Culture adequate volume   Culture   Final    NO GROWTH 5 DAYS Performed at [facility], [place] [address] [place], [place] [address]    Report Status 01/10/2020 FINAL  Final  Carbapenem Resistance Panel     Status: Abnormal   Collection Time: 01/05/20  3:08 PM  Result Value Ref Range Status   Carba Resistance IMP Gene NOT DETECTED NOT DETECTED Final   Carba  Resistance VIM Gene NOT DETECTED NOT DETECTED Final   Carba Resistance NDM Gene NOT DETECTED NOT DETECTED Final   Carba Resistance KPC Gene DETECTED (A) NOT DETECTED Final    Comment: CRITICAL RESULT CALLED TO, READ BACK BY AND VERIFIED WITH: RBV RN D. Fara Olden [phone] FCP    Carba Resistance OXA48 Gene NOT DETECTED NOT DETECTED Final    Comment: (NOTE) Cepheid Carba-R is an FDA-cleared nucleic acid amplification test  (NAAT)for the detection and differentiation of genes encoding the  most prevalent carbapenemases in bacterial isolate samples. Carbapenemase gene identification and implementation of comprehensive  infection control measures are recommended by the CDC to prevent the  spread of the resistant organisms. Performed at [facility], [place] [address] [place], [place] [address]     Coagulation Studies: No results for input(s): LABPROT, INR in the last 72 hours.  Urinalysis: No results for input(s): COLORURINE, LABSPEC, PHURINE, GLUCOSEU, HGBUR, BILIRUBINUR, KETONESUR, PROTEINUR, UROBILINOGEN, NITRITE, LEUKOCYTESUR in the last 72 hours.  Invalid input(s): APPERANCEUR    Imaging: No results found.   Medications:     iohexol  Assessment/ Plan:  69 y.o. male with a PMHx of ESRD on HD, CHF, aortic stenosis, hypertension, diabetes mellitus type 2, hyperlipidemia, obstructive sleep apnea, chronic venous stasis with LE edema, and obesity who was admitted to [facility] on 12/22/2019 for ongoing treatment of respiratory failure, ESRD, malnutrition, and generalized debility.    1.  ESRD on HD MWF.    Patient is due for hemodialysis treatment today.  Orders have been prepared.  2.  Acute respiratory failure.    Pulmonary/critical care following for respiratory failure.  Still maintained on ventilatory support.  3.  Secondary hyperparathyroidism.    Repeat serum phosphorus today.  4.  Anemia of chronic kidney disease.  Continue Retacrit 4000 units IV with dialysis  treatments.  Hemoglobin currently 7.8.    LOS: 0 Jose Tucker 4/19/20218:26 AM

## 2020-01-24 NOTE — Progress Notes (Signed)
Pulmonary [place]   PULMONARY CRITICAL CARE SERVICE  PROGRESS NOTE  Date of Service: 01/24/2020  Jose Tucker  ENI:[id number]  DOB: 03-26-1951   DOA: 12/19/2019  Referring Physician: Merton Border, MD  HPI: Jose Tucker is a 69 y.o. male seen for follow up of Acute on Chronic Respiratory Failure.  Patient is on pressure support at this time 12/5 requiring 35% FiO2.  He was attempted off the ventilator on ATC and lasted only 12 minutes.  Patient was placed back on support and now is doing better with the pressure support  Medications: Reviewed on Rounds  Physical Exam:  Vitals: Temperature is 97.3 pulse 72 respiratory 19 blood pressure is 145/48 saturations 98%  Ventilator Settings on pressure support FiO2 35% pressure support 12 PEEP 5  . General: Comfortable at this time . Eyes: Grossly normal lids, irises & conjunctiva . ENT: grossly tongue is normal . Neck: no obvious mass . Cardiovascular: S1 S2 normal no gallop . Respiratory: Coarse breath sounds with few scattered rhonchi . Abdomen: soft . Skin: no rash seen on limited exam . Musculoskeletal: not rigid . Psychiatric:unable to assess . Neurologic: no seizure no involuntary movements         Lab Data:   Basic Metabolic Panel: Recent Labs  Lab 01/19/20 0543 01/21/20 0520 01/24/20 0729  NA 134* 136 133*  K 4.0 4.0 3.7  CL 96* 97* 94*  CO2 24 24 26   GLUCOSE 108* 169* 120*  BUN 43* 41* 55*  CREATININE 3.26* 3.18* 3.66*  CALCIUM 8.6* 8.8* 8.9  PHOS 5.5* 4.4 4.3    ABG: No results for input(s): PHART, PCO2ART, PO2ART, HCO3, O2SAT in the last 168 hours.  Liver Function Tests: Recent Labs  Lab 01/19/20 0543 01/21/20 0520 01/24/20 0729  ALBUMIN 2.5* 2.6* 2.5*   No results for input(s): LIPASE, AMYLASE in the last 168 hours. No results for input(s): AMMONIA in the last 168 hours.  CBC: Recent Labs  Lab 01/18/20 0536 01/19/20 0543 01/20/20 0808  01/21/20 0744 01/24/20 0729  WBC 9.4 10.9* 10.4 12.2* 15.4*  HGB 8.3* 7.8* 7.9* 7.8* 8.4*  HCT 27.4* 26.5* 26.4* 26.9* 28.9*  MCV 102.6* 103.1* 102.7* 105.5* 106.6*  PLT 113* 119* 96* 87* 69*    Cardiac Enzymes: No results for input(s): CKTOTAL, CKMB, CKMBINDEX, TROPONINI in the last 168 hours.  BNP (last 3 results) No results for input(s): BNP in the last 8760 hours.  ProBNP (last 3 results) No results for input(s): PROBNP in the last 8760 hours.  Radiological Exams: No results found.  Assessment/Plan Active Problems:   Acute on chronic respiratory failure with hypoxia (HCC)   Chronic systolic (congestive) heart failure (HCC)   End stage renal disease on dialysis [facility])   Chronic atrial fibrillation (HCC)   Bilateral pleural effusion   1. Acute on chronic respiratory failure with hypoxia continue with pressure support as tolerated patient right now is on 12/5 doing well with 35% FiO2. 2. End-stage renal disease on hemodialysis we will continue with supportive care 3. Chronic atrial fibrillation rate controlled 4. Chronic systolic heart failure monitor fluid status closely 5. Bilateral effusions secondary to fluid status will continue to follow   I have personally seen and evaluated the patient, evaluated laboratory and imaging results, formulated the assessment and plan and placed orders. The Patient requires high complexity decision making with multiple systems involvement.  Rounds were done with the Respiratory Therapy Director and Staff therapists and discussed with nursing staff  also.  Allyne Gee, MD [facility] Pulmonary Critical Care Medicine Sleep Medicine

## 2020-01-25 DIAGNOSIS — I5022 Chronic systolic (congestive) heart failure: Secondary | ICD-10-CM | POA: Diagnosis not present

## 2020-01-25 DIAGNOSIS — I482 Chronic atrial fibrillation, unspecified: Secondary | ICD-10-CM | POA: Diagnosis not present

## 2020-01-25 DIAGNOSIS — J9621 Acute and chronic respiratory failure with hypoxia: Secondary | ICD-10-CM | POA: Diagnosis not present

## 2020-01-25 DIAGNOSIS — J90 Pleural effusion, not elsewhere classified: Secondary | ICD-10-CM | POA: Diagnosis not present

## 2020-01-25 NOTE — Progress Notes (Signed)
Pulmonary Critical Care Medicine [place]   PULMONARY CRITICAL CARE SERVICE  PROGRESS NOTE  Date of Service: 01/25/2020  Kejuan Bekker  IOE:[id number]  DOB: October 18, 1950   DOA: 12/19/2019  Referring Physician: Merton Border, MD  HPI: Enzio Buchler is a 69 y.o. male seen for follow up of Acute on Chronic Respiratory Failure.  Patient currently is on pressure support has been on 35% FiO2 will be attempting T collar again today  Medications: Reviewed on Rounds  Physical Exam:  Vitals: Temperature is 97.7 pulse 73 respiratory rate 18 blood pressure is 104/49 saturations 97%  Ventilator Settings on pressure support FiO2 35% pressure poor 12 PEEP 5  . General: Comfortable at this time . Eyes: Grossly normal lids, irises & conjunctiva . ENT: grossly tongue is normal . Neck: no obvious mass . Cardiovascular: S1 S2 normal no gallop . Respiratory: No rhonchi coarse breath sounds . Abdomen: soft . Skin: no rash seen on limited exam . Musculoskeletal: not rigid . Psychiatric:unable to assess . Neurologic: no seizure no involuntary movements         Lab Data:   Basic Metabolic Panel: Recent Labs  Lab 01/19/20 0543 01/21/20 0520 01/24/20 0729  NA 134* 136 133*  K 4.0 4.0 3.7  CL 96* 97* 94*  CO2 24 24 26   GLUCOSE 108* 169* 120*  BUN 43* 41* 55*  CREATININE 3.26* 3.18* 3.66*  CALCIUM 8.6* 8.8* 8.9  PHOS 5.5* 4.4 4.3    ABG: No results for input(s): PHART, PCO2ART, PO2ART, HCO3, O2SAT in the last 168 hours.  Liver Function Tests: Recent Labs  Lab 01/19/20 0543 01/21/20 0520 01/24/20 0729  ALBUMIN 2.5* 2.6* 2.5*   No results for input(s): LIPASE, AMYLASE in the last 168 hours. No results for input(s): AMMONIA in the last 168 hours.  CBC: Recent Labs  Lab 01/19/20 0543 01/20/20 0808 01/21/20 0744 01/24/20 0729  WBC 10.9* 10.4 12.2* 15.4*  HGB 7.8* 7.9* 7.8* 8.4*  HCT 26.5* 26.4* 26.9* 28.9*  MCV 103.1* 102.7* 105.5* 106.6*  PLT 119* 96* 87*  69*    Cardiac Enzymes: No results for input(s): CKTOTAL, CKMB, CKMBINDEX, TROPONINI in the last 168 hours.  BNP (last 3 results) No results for input(s): BNP in the last 8760 hours.  ProBNP (last 3 results) No results for input(s): PROBNP in the last 8760 hours.  Radiological Exams: No results found.  Assessment/Plan Active Problems:   Acute on chronic respiratory failure with hypoxia (HCC)   Chronic systolic (congestive) heart failure (HCC)   End stage renal disease on dialysis [facility])   Chronic atrial fibrillation (HCC)   Bilateral pleural effusion   1. Acute on chronic respiratory failure hypoxia we will continue with pressure support titrate oxygen continue pulmonary toilet will try T collar again 2. Chronic systolic heart failure at baseline we will continue medical management 3. End-stage renal disease on hemodialysis 4. Chronic atrial fibrillation rate controlled 5. Bilateral effusions monitoring   I have personally seen and evaluated the patient, evaluated laboratory and imaging results, formulated the assessment and plan and placed orders. The Patient requires high complexity decision making with multiple systems involvement.  Rounds were done with the Respiratory Therapy Director and Staff therapists and discussed with nursing staff also.  Allyne Gee, MD [facility] Pulmonary Critical Care Medicine Sleep Medicine

## 2020-01-26 DIAGNOSIS — J90 Pleural effusion, not elsewhere classified: Secondary | ICD-10-CM | POA: Diagnosis not present

## 2020-01-26 DIAGNOSIS — I5022 Chronic systolic (congestive) heart failure: Secondary | ICD-10-CM | POA: Diagnosis not present

## 2020-01-26 DIAGNOSIS — J9621 Acute and chronic respiratory failure with hypoxia: Secondary | ICD-10-CM | POA: Diagnosis not present

## 2020-01-26 DIAGNOSIS — I482 Chronic atrial fibrillation, unspecified: Secondary | ICD-10-CM | POA: Diagnosis not present

## 2020-01-26 LAB — RENAL FUNCTION PANEL
Albumin: 2.7 g/dL — ABNORMAL LOW (ref 3.5–5.0)
Anion gap: 11 (ref 5–15)
BUN: 56 mg/dL — ABNORMAL HIGH (ref 8–23)
CO2: 26 mmol/L (ref 22–32)
Calcium: 8.7 mg/dL — ABNORMAL LOW (ref 8.9–10.3)
Chloride: 95 mmol/L — ABNORMAL LOW (ref 98–111)
Creatinine, Ser: 3.44 mg/dL — ABNORMAL HIGH (ref 0.61–1.24)
GFR calc Af Amer: 20 mL/min — ABNORMAL LOW (ref >60)
GFR calc non Af Amer: 17 mL/min — ABNORMAL LOW (ref >60)
Glucose, Bld: 199 mg/dL — ABNORMAL HIGH (ref 70–99)
Phosphorus: 4.5 mg/dL (ref 2.5–4.6)
Potassium: 4 mmol/L (ref 3.5–5.1)
Sodium: 132 mmol/L — ABNORMAL LOW (ref 135–145)

## 2020-01-26 LAB — CBC
HCT: 30.8 % — ABNORMAL LOW (ref 39.0–52.0)
Hemoglobin: 8.9 g/dL — ABNORMAL LOW (ref 13.0–17.0)
MCH: 30.8 pg (ref 26.0–34.0)
MCHC: 28.9 g/dL — ABNORMAL LOW (ref 30.0–36.0)
MCV: 106.6 fL — ABNORMAL HIGH (ref 80.0–100.0)
Platelets: 75 10*3/uL — ABNORMAL LOW (ref 150–400)
RBC: 2.89 MIL/uL — ABNORMAL LOW (ref 4.22–5.81)
RDW: 21.2 % — ABNORMAL HIGH (ref 11.5–15.5)
WBC: 13.6 10*3/uL — ABNORMAL HIGH (ref 4.0–10.5)
nRBC: 0.2 % (ref 0.0–0.2)

## 2020-01-26 NOTE — Progress Notes (Addendum)
Pulmonary [place]   PULMONARY CRITICAL CARE SERVICE  PROGRESS NOTE  Date of Service: 01/26/2020  Jose Tucker  CZY:[id number]  DOB: 1951-02-05   DOA: 12/27/2019  Referring Physician: Merton Border, MD  HPI: Jose Tucker is a 69 y.o. male seen for follow up of Acute on Chronic Respiratory Failure. Patient remains on pressure support 12/5 with an FIO2 of 35%.  Sating well with no distress.  Medications: Reviewed on Rounds  Physical Exam:  Vitals: Pulse 69 respirations 20 BP 119/53 O2 sat 97% temp 90.2  Ventilator Settings per support 12/5 FiO2 35%  . General: Comfortable at this time . Eyes: Grossly normal lids, irises & conjunctiva . ENT: grossly tongue is normal . Neck: no obvious mass . Cardiovascular: S1 S2 normal no gallop . Respiratory: No rales or rhonchi noted . Abdomen: soft . Skin: no rash seen on limited exam . Musculoskeletal: not rigid . Psychiatric:unable to assess . Neurologic: no seizure no involuntary movements         Lab Data:   Basic Metabolic Panel: Recent Labs  Lab 01/21/20 0520 01/24/20 0729 01/26/20 0624  NA 136 133* 132*  K 4.0 3.7 4.0  CL 97* 94* 95*  CO2 24 26 26   GLUCOSE 169* 120* 199*  BUN 41* 55* 56*  CREATININE 3.18* 3.66* 3.44*  CALCIUM 8.8* 8.9 8.7*  PHOS 4.4 4.3 4.5    ABG: No results for input(s): PHART, PCO2ART, PO2ART, HCO3, O2SAT in the last 168 hours.  Liver Function Tests: Recent Labs  Lab 01/21/20 0520 01/24/20 0729 01/26/20 0624  ALBUMIN 2.6* 2.5* 2.7*   No results for input(s): LIPASE, AMYLASE in the last 168 hours. No results for input(s): AMMONIA in the last 168 hours.  CBC: Recent Labs  Lab 01/20/20 0808 01/21/20 0744 01/24/20 0729 01/26/20 0624  WBC 10.4 12.2* 15.4* 13.6*  HGB 7.9* 7.8* 8.4* 8.9*  HCT 26.4* 26.9* 28.9* 30.8*  MCV 102.7* 105.5* 106.6* 106.6*  PLT 96* 87* 69* 75*    Cardiac Enzymes: No results for input(s): CKTOTAL, CKMB, CKMBINDEX,  TROPONINI in the last 168 hours.  BNP (last 3 results) No results for input(s): BNP in the last 8760 hours.  ProBNP (last 3 results) No results for input(s): PROBNP in the last 8760 hours.  Radiological Exams: No results found.  Assessment/Plan Active Problems:   Acute on chronic respiratory failure with hypoxia (HCC)   Chronic systolic (congestive) heart failure (HCC)   End stage renal disease on dialysis [facility])   Chronic atrial fibrillation (HCC)   Bilateral pleural effusion   1. Acute on chronic respiratory failure hypoxia patient will continue to wean on pressure support currently on 35% FiO2 continue aggressive pulmonary toilet supportive measures. 2. Chronic systolic heart failure at baseline we will continue medical management 3. End-stage renal disease on hemodialysis 4. Chronic atrial fibrillation rate controlled 5. Bilateral effusions monitoring   I have personally seen and evaluated the patient, evaluated laboratory and imaging results, formulated the assessment and plan and placed orders. The Patient requires high complexity decision making with multiple systems involvement.  Rounds were done with the Respiratory Therapy Director and Staff therapists and discussed with nursing staff also.  Allyne Gee, MD [facility] Pulmonary Critical Care Medicine Sleep Medicine

## 2020-01-26 NOTE — Progress Notes (Signed)
[facility]  ROUNDING NOTE   Subjective:  Patient seen and evaluated at bedside. Due for hemodialysis later today. Still requiring ventilatory support. Overall remains quite debilitated.  Objective:  Vital signs in last 24 hours:  Temperature 96.2 pulse 69 respirations 20 blood pressure 119/53  Physical Exam: General: Critically ill-appearing  Head: Normocephalic, atraumatic. Moist oral mucosal membranes  Eyes: Anicteric  Neck: Tracheostomy in place  Lungs:  Scattered rhonchi bilateral, vent assisted  Heart: S1S2 irregular  Abdomen:  Soft, nontender, bowel sounds present  Extremities: trace peripheral edema.  Neurologic: Awake, alert, following commands  Skin: Bilateral upper extremity ecchymoses  Access: Right IJ PermCath    Basic Metabolic Panel: Recent Labs  Lab 01/21/20 0520 01/24/20 0729 01/26/20 0624  NA 136 133* 132*  K 4.0 3.7 4.0  CL 97* 94* 95*  CO2 24 26 26   GLUCOSE 169* 120* 199*  BUN 41* 55* 56*  CREATININE 3.18* 3.66* 3.44*  CALCIUM 8.8* 8.9 8.7*  PHOS 4.4 4.3 4.5    Liver Function Tests: Recent Labs  Lab 01/21/20 0520 01/24/20 0729 01/26/20 0624  ALBUMIN 2.6* 2.5* 2.7*   No results for input(s): LIPASE, AMYLASE in the last 168 hours. No results for input(s): AMMONIA in the last 168 hours.  CBC: Recent Labs  Lab 01/20/20 0808 01/21/20 0744 01/24/20 0729 01/26/20 0624  WBC 10.4 12.2* 15.4* 13.6*  HGB 7.9* 7.8* 8.4* 8.9*  HCT 26.4* 26.9* 28.9* 30.8*  MCV 102.7* 105.5* 106.6* 106.6*  PLT 96* 87* 69* 75*    Cardiac Enzymes: No results for input(s): CKTOTAL, CKMB, CKMBINDEX, TROPONINI in the last 168 hours.  BNP: Invalid input(s): POCBNP  CBG: No results for input(s): GLUCAP in the last 168 hours.  Microbiology: Results for orders placed or performed during the hospital encounter of 12/19/2019  C difficile quick scan w PCR reflex     Status: None   Collection Time: 12/18/19  4:49 AM   Specimen: STOOL  Result Value  Ref Range Status   C Diff antigen NEGATIVE NEGATIVE Final   C Diff toxin NEGATIVE NEGATIVE Final   C Diff interpretation No C. difficile detected.  Corrected    Comment: Performed at [facility], [place] [address], [place], [place] [address] CORRECTED ON 03/13 AT 1510: PREVIOUSLY REPORTED AS VALID   Culture, respiratory (non-expectorated)     Status: None   Collection Time: 12/24/19  4:00 PM   Specimen: Tracheal Aspirate; Respiratory  Result Value Ref Range Status   Specimen Description TRACHEAL ASPIRATE  Final   Special Requests NONE  Final   Gram Stain   Final    ABUNDANT WBC PRESENT, PREDOMINANTLY PMN ABUNDANT GRAM POSITIVE RODS FEW GRAM NEGATIVE RODS Performed at [facility], [place] [address] [place], [place] [address]    Culture MODERATE PROTEUS MIRABILIS  Final   Report Status 12/26/2019 FINAL  Final   Organism ID, Bacteria PROTEUS MIRABILIS  Final      Susceptibility   Proteus mirabilis - MIC*    AMPICILLIN <=2 SENSITIVE Sensitive     CEFAZOLIN 8 SENSITIVE Sensitive     CEFEPIME <=0.12 SENSITIVE Sensitive     CEFTAZIDIME <=1 SENSITIVE Sensitive     CEFTRIAXONE <=0.25 SENSITIVE Sensitive     CIPROFLOXACIN <=0.25 SENSITIVE Sensitive     GENTAMICIN <=1 SENSITIVE Sensitive     IMIPENEM 4 SENSITIVE Sensitive     TRIMETH/SULFA <=20 SENSITIVE Sensitive     AMPICILLIN/SULBACTAM <=2 SENSITIVE Sensitive     PIP/TAZO <=  4 SENSITIVE Sensitive     * MODERATE PROTEUS MIRABILIS  Culture, blood (routine x 2)     Status: None   Collection Time: 12/25/19  5:16 PM   Specimen: BLOOD  Result Value Ref Range Status   Specimen Description BLOOD RIGHT ANTECUBITAL  Final   Special Requests   Final    BOTTLES DRAWN AEROBIC AND ANAEROBIC Blood Culture adequate volume   Culture   Final    NO GROWTH 5 DAYS Performed at [facility], [place] [address] [place], [place] [address]    Report Status 12/30/2019 FINAL  Final  Culture, blood (routine x 2)     Status: None   Collection  Time: 12/25/19  5:18 PM   Specimen: BLOOD  Result Value Ref Range Status   Specimen Description BLOOD RIGHT ANTECUBITAL  Final   Special Requests   Final    BOTTLES DRAWN AEROBIC AND ANAEROBIC Blood Culture adequate volume   Culture   Final    NO GROWTH 5 DAYS Performed at [facility], [place] [address] [place], [place] [address]    Report Status 12/30/2019 FINAL  Final  C difficile quick scan w PCR reflex     Status: Abnormal   Collection Time: 12/26/19 12:11 PM   Specimen: STOOL  Result Value Ref Range Status   C Diff antigen POSITIVE (A) NEGATIVE Final   C Diff toxin POSITIVE (A) NEGATIVE Final    Comment: CRITICAL RESULT CALLED TO, READ BACK BY AND VERIFIED WITH:  RN CHRISSY R. @1817  12/26/2019 AKT    C Diff interpretation Toxin producing C. difficile detected.  Final  Culture, blood (routine x 2)     Status: None   Collection Time: 01/05/20 12:43 PM   Specimen: BLOOD  Result Value Ref Range Status   Specimen Description BLOOD RIGHT ANTECUBITAL  Final   Special Requests   Final    BOTTLES DRAWN AEROBIC AND ANAEROBIC Blood Culture adequate volume   Culture   Final    NO GROWTH 5 DAYS Performed at [facility], [address], [place], [place] [address]    Report Status 01/10/2020 FINAL  Final  Culture, respiratory (non-expectorated)     Status: None   Collection Time: 01/05/20  2:55 PM   Specimen: Tracheal Aspirate; Respiratory  Result Value Ref Range Status   Specimen Description TRACHEAL ASPIRATE  Final   Special Requests NONE  Final   Gram Stain   Final    ABUNDANT WBC PRESENT,BOTH PMN AND MONONUCLEAR ABUNDANT GRAM NEGATIVE RODS Performed at [facility], [place] [address], [place], [place] [address]    Culture   Final    ABUNDANT KLEBSIELLA PNEUMONIAE MODERATE PROTEUS MIRABILIS KLEBSIELLA PNEUMONIAE CONFIRMED CARBAPENEMASE RESISTANT ENTEROBACTERIACAE KLEBSIELLA PNEUMONIAE Confirmed Extended Spectrum Beta-Lactamase Producer (ESBL).  In bloodstream  infections from ESBL organisms, carbapenems are preferred over piperacillin/tazobactam. They are shown to have a lower risk of mortality. KLEBSIELLA PNEUMONIAE MULTI-DRUG RESISTANT ORGANISM    Report Status 01/11/2020 FINAL  Final   Organism ID, Bacteria PROTEUS MIRABILIS  Final   Organism ID, Bacteria KLEBSIELLA PNEUMONIAE  Final      Susceptibility   Klebsiella pneumoniae - MIC*    AMPICILLIN >=32 RESISTANT Resistant     CEFAZOLIN >=64 RESISTANT Resistant     CEFEPIME >=32 RESISTANT Resistant     CEFTAZIDIME >=64 RESISTANT Resistant     CEFTRIAXONE >=64 RESISTANT Resistant     CIPROFLOXACIN <=0.25 SENSITIVE Sensitive     GENTAMICIN >=  16 RESISTANT Resistant     IMIPENEM >=16 RESISTANT Resistant     TRIMETH/SULFA >=320 RESISTANT Resistant     AMPICILLIN/SULBACTAM >=32 RESISTANT Resistant     PIP/TAZO >=128 RESISTANT Resistant     * ABUNDANT KLEBSIELLA PNEUMONIAE   Proteus mirabilis - MIC*    AMPICILLIN <=2 SENSITIVE Sensitive     CEFAZOLIN <=4 SENSITIVE Sensitive     CEFEPIME <=0.12 SENSITIVE Sensitive     CEFTAZIDIME <=1 SENSITIVE Sensitive     CEFTRIAXONE <=0.25 SENSITIVE Sensitive     CIPROFLOXACIN <=0.25 SENSITIVE Sensitive     GENTAMICIN <=1 SENSITIVE Sensitive     IMIPENEM 8 INTERMEDIATE Intermediate     TRIMETH/SULFA <=20 SENSITIVE Sensitive     AMPICILLIN/SULBACTAM <=2 SENSITIVE Sensitive     PIP/TAZO <=4 SENSITIVE Sensitive     * MODERATE PROTEUS MIRABILIS  Culture, blood (routine x 2)     Status: None   Collection Time: 01/05/20  3:04 PM   Specimen: BLOOD  Result Value Ref Range Status   Specimen Description BLOOD RIGHT ANTECUBITAL  Final   Special Requests   Final    BOTTLES DRAWN AEROBIC ONLY Blood Culture adequate volume   Culture   Final    NO GROWTH 5 DAYS Performed at [facility], [address] [place], [place] [address]    Report Status 01/10/2020 FINAL  Final  Carbapenem Resistance Panel     Status: Abnormal   Collection Time: 01/05/20  3:08 PM   Result Value Ref Range Status   Carba Resistance IMP Gene NOT DETECTED NOT DETECTED Final   Carba Resistance VIM Gene NOT DETECTED NOT DETECTED Final   Carba Resistance NDM Gene NOT DETECTED NOT DETECTED Final   Carba Resistance KPC Gene DETECTED (A) NOT DETECTED Final    Comment: CRITICAL RESULT CALLED TO, READ BACK BY AND VERIFIED WITH: RBV RN D. Fara Olden [phone] [id number] FCP    Carba Resistance OXA48 Gene NOT DETECTED NOT DETECTED Final    Comment: (NOTE) Cepheid Carba-R is an FDA-cleared nucleic acid amplification test  (NAAT)for the detection and differentiation of genes encoding the  most prevalent carbapenemases in bacterial isolate samples. Carbapenemase gene identification and implementation of comprehensive  infection control measures are recommended by the CDC to prevent the  spread of the resistant organisms. Performed at [facility], [place] [address] [place], [place] [address]     Coagulation Studies: No results for input(s): LABPROT, INR in the last 72 hours.  Urinalysis: No results for input(s): COLORURINE, LABSPEC, PHURINE, GLUCOSEU, HGBUR, BILIRUBINUR, KETONESUR, PROTEINUR, UROBILINOGEN, NITRITE, LEUKOCYTESUR in the last 72 hours.  Invalid input(s): APPERANCEUR    Imaging: No results found.   Medications:     iohexol  Assessment/ Plan:  69 y.o. male with a PMHx of ESRD on HD, CHF, aortic stenosis, hypertension, diabetes mellitus type 2, hyperlipidemia, obstructive sleep apnea, chronic venous stasis with LE edema, and obesity who was admitted to [facility] on 01/01/2020 for ongoing treatment of respiratory failure, ESRD, malnutrition, and generalized debility.    1.  ESRD on HD MWF.    Patient seen and evaluated at bedside.  Due for dialysis today.  2.  Acute respiratory failure.    Patient remains ventilatory dependent.  Weaning protocol as per pulmonary/critical care.  3.  Secondary hyperparathyroidism.    Phosphorus 4.5 and at target.  4.  Anemia of  chronic kidney disease.  Hemoglobin now up to 8.9.  Maintain the patient on Retacrit 4000 IV with dialysis treatments.  LOS: 0 Tajah Schreiner 4/21/20218:30 AM

## 2020-01-27 DIAGNOSIS — I5022 Chronic systolic (congestive) heart failure: Secondary | ICD-10-CM | POA: Diagnosis not present

## 2020-01-27 DIAGNOSIS — J9621 Acute and chronic respiratory failure with hypoxia: Secondary | ICD-10-CM | POA: Diagnosis not present

## 2020-01-27 DIAGNOSIS — I482 Chronic atrial fibrillation, unspecified: Secondary | ICD-10-CM | POA: Diagnosis not present

## 2020-01-27 DIAGNOSIS — J90 Pleural effusion, not elsewhere classified: Secondary | ICD-10-CM | POA: Diagnosis not present

## 2020-01-27 NOTE — Progress Notes (Signed)
Pulmonary [place]   PULMONARY CRITICAL CARE SERVICE  PROGRESS NOTE  Date of Service: 01/27/2020  Deny Chevez  IRS:[id number]  DOB: 1951/01/06   DOA: 12/23/2019  Referring Physician: Merton Border, MD  HPI: Byran Bilotti is a 69 y.o. male seen for follow up of Acute on Chronic Respiratory Failure.  Patient currently is on pressure support has been on 30% FiO2 and doing about 12/5  Medications: Reviewed on Rounds  Physical Exam:  Vitals: Temperature is 98.7 pulse 78 respiratory 18 blood pressure is 112/58 saturations 100%  Ventilator Settings mode ventilation pressure support FiO2 30% pressure poor 12 PEEP 5  . General: Comfortable at this time . Eyes: Grossly normal lids, irises & conjunctiva . ENT: grossly tongue is normal . Neck: no obvious mass . Cardiovascular: S1 S2 normal no gallop . Respiratory: No rhonchi coarse breath sounds are noted . Abdomen: soft . Skin: no rash seen on limited exam . Musculoskeletal: not rigid . Psychiatric:unable to assess . Neurologic: no seizure no involuntary movements         Lab Data:   Basic Metabolic Panel: Recent Labs  Lab 01/21/20 0520 01/24/20 0729 01/26/20 0624  NA 136 133* 132*  K 4.0 3.7 4.0  CL 97* 94* 95*  CO2 24 26 26   GLUCOSE 169* 120* 199*  BUN 41* 55* 56*  CREATININE 3.18* 3.66* 3.44*  CALCIUM 8.8* 8.9 8.7*  PHOS 4.4 4.3 4.5    ABG: No results for input(s): PHART, PCO2ART, PO2ART, HCO3, O2SAT in the last 168 hours.  Liver Function Tests: Recent Labs  Lab 01/21/20 0520 01/24/20 0729 01/26/20 0624  ALBUMIN 2.6* 2.5* 2.7*   No results for input(s): LIPASE, AMYLASE in the last 168 hours. No results for input(s): AMMONIA in the last 168 hours.  CBC: Recent Labs  Lab 01/21/20 0744 01/24/20 0729 01/26/20 0624  WBC 12.2* 15.4* 13.6*  HGB 7.8* 8.4* 8.9*  HCT 26.9* 28.9* 30.8*  MCV 105.5* 106.6* 106.6*  PLT 87* 69* 75*    Cardiac Enzymes: No results for  input(s): CKTOTAL, CKMB, CKMBINDEX, TROPONINI in the last 168 hours.  BNP (last 3 results) No results for input(s): BNP in the last 8760 hours.  ProBNP (last 3 results) No results for input(s): PROBNP in the last 8760 hours.  Radiological Exams: No results found.  Assessment/Plan Active Problems:   Acute on chronic respiratory failure with hypoxia (HCC)   Chronic systolic (congestive) heart failure (HCC)   End stage renal disease on dialysis [facility])   Chronic atrial fibrillation (HCC)   Bilateral pleural effusion   1. Acute on chronic respiratory failure hypoxia plan is to continue with weaning on protocol patient is doing well fairly well with the pressure support will wean as tolerated 2. Chronic systolic heart failure right now compensated monitor fluid status 3. End-stage renal disease on dialysis follow with nephrology recommendations 4. Chronic atrial fibrillation rate controlled 5. Bilateral pleural effusions no change we will continue to follow   I have personally seen and evaluated the patient, evaluated laboratory and imaging results, formulated the assessment and plan and placed orders. The Patient requires high complexity decision making with multiple systems involvement.  Rounds were done with the Respiratory Therapy Director and Staff therapists and discussed with nursing staff also.  Allyne Gee, MD [facility] Pulmonary Critical Care Medicine Sleep Medicine

## 2020-01-28 DIAGNOSIS — J90 Pleural effusion, not elsewhere classified: Secondary | ICD-10-CM | POA: Diagnosis not present

## 2020-01-28 DIAGNOSIS — I5022 Chronic systolic (congestive) heart failure: Secondary | ICD-10-CM | POA: Diagnosis not present

## 2020-01-28 DIAGNOSIS — I482 Chronic atrial fibrillation, unspecified: Secondary | ICD-10-CM | POA: Diagnosis not present

## 2020-01-28 DIAGNOSIS — J9621 Acute and chronic respiratory failure with hypoxia: Secondary | ICD-10-CM | POA: Diagnosis not present

## 2020-01-28 LAB — RENAL FUNCTION PANEL
Albumin: 2.9 g/dL — ABNORMAL LOW (ref 3.5–5.0)
Anion gap: 10 (ref 5–15)
BUN: 58 mg/dL — ABNORMAL HIGH (ref 8–23)
CO2: 27 mmol/L (ref 22–32)
Calcium: 9.1 mg/dL (ref 8.9–10.3)
Chloride: 97 mmol/L — ABNORMAL LOW (ref 98–111)
Creatinine, Ser: 3.25 mg/dL — ABNORMAL HIGH (ref 0.61–1.24)
GFR calc Af Amer: 21 mL/min — ABNORMAL LOW (ref >60)
GFR calc non Af Amer: 19 mL/min — ABNORMAL LOW (ref >60)
Glucose, Bld: 144 mg/dL — ABNORMAL HIGH (ref 70–99)
Phosphorus: 3.8 mg/dL (ref 2.5–4.6)
Potassium: 4 mmol/L (ref 3.5–5.1)
Sodium: 134 mmol/L — ABNORMAL LOW (ref 135–145)

## 2020-01-28 LAB — CBC
HCT: 32 % — ABNORMAL LOW (ref 39.0–52.0)
Hemoglobin: 9.3 g/dL — ABNORMAL LOW (ref 13.0–17.0)
MCH: 30.7 pg (ref 26.0–34.0)
MCHC: 29.1 g/dL — ABNORMAL LOW (ref 30.0–36.0)
MCV: 105.6 fL — ABNORMAL HIGH (ref 80.0–100.0)
Platelets: 78 10*3/uL — ABNORMAL LOW (ref 150–400)
RBC: 3.03 MIL/uL — ABNORMAL LOW (ref 4.22–5.81)
RDW: 21.3 % — ABNORMAL HIGH (ref 11.5–15.5)
WBC: 12.1 10*3/uL — ABNORMAL HIGH (ref 4.0–10.5)
nRBC: 0.2 % (ref 0.0–0.2)

## 2020-01-28 NOTE — Progress Notes (Addendum)
Pulmonary [place]   PULMONARY CRITICAL CARE SERVICE  PROGRESS NOTE  Date of Service: 01/28/2020  Keontre Defino  RSW:[id number]  DOB: [birth year]   DOA: 12/28/2019  Referring Physician: Merton Border, MD  HPI: Jose Tucker is a 69 y.o. male seen for follow up of Acute on Chronic Respiratory Failure.  Patient remains on pressure support as tolerated this time currently requiring 28% FiO2 satting well no distress.  Medications: Reviewed on Rounds  Physical Exam:  Vitals: Pulse 84 respirations 24 BP 154/73 O2 sat 96% temp 96.1  Ventilator Settings per support 12/5 FiO2 of 28%  . General: Comfortable at this time . Eyes: Grossly normal lids, irises & conjunctiva . ENT: grossly tongue is normal . Neck: no obvious mass . Cardiovascular: S1 S2 normal no gallop . Respiratory: No rales or rhonchi noted . Abdomen: soft . Skin: no rash seen on limited exam . Musculoskeletal: not rigid . Psychiatric:unable to assess . Neurologic: no seizure no involuntary movements         Lab Data:   Basic Metabolic Panel: Recent Labs  Lab 01/24/20 0729 01/26/20 0624 01/28/20 1002  NA 133* 132* 134*  K 3.7 4.0 4.0  CL 94* 95* 97*  CO2 26 26 27   GLUCOSE 120* 199* 144*  BUN 55* 56* 58*  CREATININE 3.66* 3.44* 3.25*  CALCIUM 8.9 8.7* 9.1  PHOS 4.3 4.5 3.8    ABG: No results for input(s): PHART, PCO2ART, PO2ART, HCO3, O2SAT in the last 168 hours.  Liver Function Tests: Recent Labs  Lab 01/24/20 0729 01/26/20 0624 01/28/20 1002  ALBUMIN 2.5* 2.7* 2.9*   No results for input(s): LIPASE, AMYLASE in the last 168 hours. No results for input(s): AMMONIA in the last 168 hours.  CBC: Recent Labs  Lab 01/24/20 0729 01/26/20 0624 01/28/20 1002  WBC 15.4* 13.6* 12.1*  HGB 8.4* 8.9* 9.3*  HCT 28.9* 30.8* 32.0*  MCV 106.6* 106.6* 105.6*  PLT 69* 75* 78*    Cardiac Enzymes: No results for input(s): CKTOTAL, CKMB, CKMBINDEX, TROPONINI in the  last 168 hours.  BNP (last 3 results) No results for input(s): BNP in the last 8760 hours.  ProBNP (last 3 results) No results for input(s): PROBNP in the last 8760 hours.  Radiological Exams: No results found.  Assessment/Plan Active Problems:   Acute on chronic respiratory failure with hypoxia (HCC)   Chronic systolic (congestive) heart failure (HCC)   End stage renal disease on dialysis [facility])   Chronic atrial fibrillation (HCC)   Bilateral pleural effusion   1. Acute on chronic respiratory failure hypoxia plan is to continue with pressure support as tolerated.  Continue supportive measures at this time. 2. Chronic systolic heart failure right now compensated monitor fluid status 3. End-stage renal disease on dialysis follow with nephrology recommendations 4. Chronic atrial fibrillation rate controlled 5. Bilateral pleural effusions no change we will continue to follow   I have personally seen and evaluated the patient, evaluated laboratory and imaging results, formulated the assessment and plan and placed orders. The Patient requires high complexity decision making with multiple systems involvement.  Rounds were done with the Respiratory Therapy Director and Staff therapists and discussed with nursing staff also.  Allyne Gee, MD [facility] Pulmonary Critical Care Medicine Sleep Medicine

## 2020-01-28 NOTE — Progress Notes (Signed)
[facility]  ROUNDING NOTE   Subjective:  Patient due for hemodialysis today. Remains on ventilatory support.  Objective:  Vital signs in last 24 hours:  Temperature 96.1 pulse 84 respirations 24 blood pressure 154/73  Physical Exam: General: Critically ill-appearing  Head: Normocephalic, atraumatic. Moist oral mucosal membranes  Eyes: Anicteric  Neck: Tracheostomy in place  Lungs:  Scattered rhonchi bilateral, vent assisted  Heart: S1S2 irregular  Abdomen:  Soft, nontender, bowel sounds present  Extremities: Trace peripheral edema.  Neurologic: Awake, alert, following commands  Skin: Bilateral upper extremity ecchymoses  Access: Right IJ PermCath    Basic Metabolic Panel: Recent Labs  Lab 01/24/20 0729 01/26/20 0624  NA 133* 132*  K 3.7 4.0  CL 94* 95*  CO2 26 26  GLUCOSE 120* 199*  BUN 55* 56*  CREATININE 3.66* 3.44*  CALCIUM 8.9 8.7*  PHOS 4.3 4.5    Liver Function Tests: Recent Labs  Lab 01/24/20 0729 01/26/20 0624  ALBUMIN 2.5* 2.7*   No results for input(s): LIPASE, AMYLASE in the last 168 hours. No results for input(s): AMMONIA in the last 168 hours.  CBC: Recent Labs  Lab 01/24/20 0729 01/26/20 0624  WBC 15.4* 13.6*  HGB 8.4* 8.9*  HCT 28.9* 30.8*  MCV 106.6* 106.6*  PLT 69* 75*    Cardiac Enzymes: No results for input(s): CKTOTAL, CKMB, CKMBINDEX, TROPONINI in the last 168 hours.  BNP: Invalid input(s): POCBNP  CBG: No results for input(s): GLUCAP in the last 168 hours.  Microbiology: Results for orders placed or performed during the hospital encounter of 01/02/2020  C difficile quick scan w PCR reflex     Status: None   Collection Time: 12/18/19  4:49 AM   Specimen: STOOL  Result Value Ref Range Status   C Diff antigen NEGATIVE NEGATIVE Final   C Diff toxin NEGATIVE NEGATIVE Final   C Diff interpretation No C. difficile detected.  Corrected    Comment: Performed at [facility], [place] [address]  [place], [place] [address] CORRECTED ON 03/13 AT 1510: PREVIOUSLY REPORTED AS VALID   Culture, respiratory (non-expectorated)     Status: None   Collection Time: 12/24/19  4:00 PM   Specimen: Tracheal Aspirate; Respiratory  Result Value Ref Range Status   Specimen Description TRACHEAL ASPIRATE  Final   Special Requests NONE  Final   Gram Stain   Final    ABUNDANT WBC PRESENT, PREDOMINANTLY PMN ABUNDANT GRAM POSITIVE RODS FEW GRAM NEGATIVE RODS Performed at [facility], [place] [address], [place], [place] [address]    Culture MODERATE PROTEUS MIRABILIS  Final   Report Status 12/26/2019 FINAL  Final   Organism ID, Bacteria PROTEUS MIRABILIS  Final      Susceptibility   Proteus mirabilis - MIC*    AMPICILLIN <=2 SENSITIVE Sensitive     CEFAZOLIN 8 SENSITIVE Sensitive     CEFEPIME <=0.12 SENSITIVE Sensitive     CEFTAZIDIME <=1 SENSITIVE Sensitive     CEFTRIAXONE <=0.25 SENSITIVE Sensitive     CIPROFLOXACIN <=0.25 SENSITIVE Sensitive     GENTAMICIN <=1 SENSITIVE Sensitive     IMIPENEM 4 SENSITIVE Sensitive     TRIMETH/SULFA <=20 SENSITIVE Sensitive     AMPICILLIN/SULBACTAM <=2 SENSITIVE Sensitive     PIP/TAZO <=4 SENSITIVE Sensitive     * MODERATE PROTEUS MIRABILIS  Culture, blood (routine x 2)     Status: None   Collection Time: 12/25/19  5:16 PM   Specimen: BLOOD  Result Value  Ref Range Status   Specimen Description BLOOD RIGHT ANTECUBITAL  Final   Special Requests   Final    BOTTLES DRAWN AEROBIC AND ANAEROBIC Blood Culture adequate volume   Culture   Final    NO GROWTH 5 DAYS Performed at [facility], [address], [place], [place] [address]    Report Status 12/30/2019 FINAL  Final  Culture, blood (routine x 2)     Status: None   Collection Time: 12/25/19  5:18 PM   Specimen: BLOOD  Result Value Ref Range Status   Specimen Description BLOOD RIGHT ANTECUBITAL  Final   Special Requests   Final    BOTTLES DRAWN AEROBIC AND ANAEROBIC Blood Culture adequate volume    Culture   Final    NO GROWTH 5 DAYS Performed at [facility], [place] [address], [place], [place] [address]    Report Status 12/30/2019 FINAL  Final  C difficile quick scan w PCR reflex     Status: Abnormal   Collection Time: 12/26/19 12:11 PM   Specimen: STOOL  Result Value Ref Range Status   C Diff antigen POSITIVE (A) NEGATIVE Final   C Diff toxin POSITIVE (A) NEGATIVE Final    Comment: CRITICAL RESULT CALLED TO, READ BACK BY AND VERIFIED WITH:  RN CHRISSY R. @[id number]  12/26/2019 AKT    C Diff interpretation Toxin producing C. difficile detected.  Final  Culture, blood (routine x 2)     Status: None   Collection Time: 01/05/20 12:43 PM   Specimen: BLOOD  Result Value Ref Range Status   Specimen Description BLOOD RIGHT ANTECUBITAL  Final   Special Requests   Final    BOTTLES DRAWN AEROBIC AND ANAEROBIC Blood Culture adequate volume   Culture   Final    NO GROWTH 5 DAYS Performed at [facility], [address], [place], [place] [address]    Report Status 01/10/2020 FINAL  Final  Culture, respiratory (non-expectorated)     Status: None   Collection Time: 01/05/20  2:55 PM   Specimen: Tracheal Aspirate; Respiratory  Result Value Ref Range Status   Specimen Description TRACHEAL ASPIRATE  Final   Special Requests NONE  Final   Gram Stain   Final    ABUNDANT WBC PRESENT,BOTH PMN AND MONONUCLEAR ABUNDANT GRAM NEGATIVE RODS Performed at [facility], [place] [address], [place], [place] [address]    Culture   Final    ABUNDANT KLEBSIELLA PNEUMONIAE MODERATE PROTEUS MIRABILIS KLEBSIELLA PNEUMONIAE CONFIRMED CARBAPENEMASE RESISTANT ENTEROBACTERIACAE KLEBSIELLA PNEUMONIAE Confirmed Extended Spectrum Beta-Lactamase Producer (ESBL).  In bloodstream infections from ESBL organisms, carbapenems are preferred over piperacillin/tazobactam. They are shown to have a lower risk of mortality. KLEBSIELLA PNEUMONIAE MULTI-DRUG RESISTANT ORGANISM    Report Status 01/11/2020 FINAL   Final   Organism ID, Bacteria PROTEUS MIRABILIS  Final   Organism ID, Bacteria KLEBSIELLA PNEUMONIAE  Final      Susceptibility   Klebsiella pneumoniae - MIC*    AMPICILLIN >=32 RESISTANT Resistant     CEFAZOLIN >=64 RESISTANT Resistant     CEFEPIME >=32 RESISTANT Resistant     CEFTAZIDIME >=64 RESISTANT Resistant     CEFTRIAXONE >=64 RESISTANT Resistant     CIPROFLOXACIN <=0.25 SENSITIVE Sensitive     GENTAMICIN >=16 RESISTANT Resistant     IMIPENEM >=16 RESISTANT Resistant     TRIMETH/SULFA >=320 RESISTANT Resistant     AMPICILLIN/SULBACTAM >=32 RESISTANT Resistant     PIP/TAZO >=128 RESISTANT Resistant     *  ABUNDANT KLEBSIELLA PNEUMONIAE   Proteus mirabilis - MIC*    AMPICILLIN <=2 SENSITIVE Sensitive     CEFAZOLIN <=4 SENSITIVE Sensitive     CEFEPIME <=0.12 SENSITIVE Sensitive     CEFTAZIDIME <=1 SENSITIVE Sensitive     CEFTRIAXONE <=0.25 SENSITIVE Sensitive     CIPROFLOXACIN <=0.25 SENSITIVE Sensitive     GENTAMICIN <=1 SENSITIVE Sensitive     IMIPENEM 8 INTERMEDIATE Intermediate     TRIMETH/SULFA <=20 SENSITIVE Sensitive     AMPICILLIN/SULBACTAM <=2 SENSITIVE Sensitive     PIP/TAZO <=4 SENSITIVE Sensitive     * MODERATE PROTEUS MIRABILIS  Culture, blood (routine x 2)     Status: None   Collection Time: 01/05/20  3:04 PM   Specimen: BLOOD  Result Value Ref Range Status   Specimen Description BLOOD RIGHT ANTECUBITAL  Final   Special Requests   Final    BOTTLES DRAWN AEROBIC ONLY Blood Culture adequate volume   Culture   Final    NO GROWTH 5 DAYS Performed at [facility], [place] [address], [place], [place] [address]    Report Status 01/10/2020 FINAL  Final  Carbapenem Resistance Panel     Status: Abnormal   Collection Time: 01/05/20  3:08 PM  Result Value Ref Range Status   Carba Resistance IMP Gene NOT DETECTED NOT DETECTED Final   Carba Resistance VIM Gene NOT DETECTED NOT DETECTED Final   Carba Resistance NDM Gene NOT DETECTED NOT DETECTED Final   Carba  Resistance KPC Gene DETECTED (A) NOT DETECTED Final    Comment: CRITICAL RESULT CALLED TO, READ BACK BY AND VERIFIED WITH: RBV RN D. Fara Olden [id number] FCP    Carba Resistance OXA48 Gene NOT DETECTED NOT DETECTED Final    Comment: (NOTE) Cepheid Carba-R is an FDA-cleared nucleic acid amplification test  (NAAT)for the detection and differentiation of genes encoding the  most prevalent carbapenemases in bacterial isolate samples. Carbapenemase gene identification and implementation of comprehensive  infection control measures are recommended by the CDC to prevent the  spread of the resistant organisms. Performed at [facility], [place] [address], [place], [place] [address]     Coagulation Studies: No results for input(s): LABPROT, INR in the last 72 hours.  Urinalysis: No results for input(s): COLORURINE, LABSPEC, PHURINE, GLUCOSEU, HGBUR, BILIRUBINUR, KETONESUR, PROTEINUR, UROBILINOGEN, NITRITE, LEUKOCYTESUR in the last 72 hours.  Invalid input(s): APPERANCEUR    Imaging: No results found.   Medications:     iohexol  Assessment/ Plan:  69 y.o. male with a PMHx of ESRD on HD, CHF, aortic stenosis, hypertension, diabetes mellitus type 2, hyperlipidemia, obstructive sleep apnea, chronic venous stasis with LE edema, and obesity who was admitted to [facility] on 01/03/2020 for ongoing treatment of respiratory failure, ESRD, malnutrition, and generalized debility.    1.  ESRD on HD MWF.    Patient due for hemodialysis later today.  Ultrafiltration target 1.5 kg.  2.  Acute respiratory failure.    Patient still on the ventilator and requiring 28% FiO2 with a PEEP of 5.  Weaning protocol per pulmonary/critical care.  3.  Secondary hyperparathyroidism.    Phosphorus at last check was 4.5.  Recommend rechecking serum phosphorus today.  4.  Anemia of chronic kidney disease.  Maintain the patient on Retacrit 4000 IV with dialysis and repeat CBC today.    LOS: 0 Fae Blossom 4/23/20218:32 AM

## 2020-01-29 DIAGNOSIS — I482 Chronic atrial fibrillation, unspecified: Secondary | ICD-10-CM | POA: Diagnosis not present

## 2020-01-29 DIAGNOSIS — J90 Pleural effusion, not elsewhere classified: Secondary | ICD-10-CM | POA: Diagnosis not present

## 2020-01-29 DIAGNOSIS — J9621 Acute and chronic respiratory failure with hypoxia: Secondary | ICD-10-CM | POA: Diagnosis not present

## 2020-01-29 DIAGNOSIS — I5022 Chronic systolic (congestive) heart failure: Secondary | ICD-10-CM | POA: Diagnosis not present

## 2020-01-29 NOTE — Progress Notes (Signed)
Pulmonary [place]   PULMONARY CRITICAL CARE SERVICE  PROGRESS NOTE  Date of Service: 01/29/2020  Jose Tucker  MWU:[id number]  DOB: October 16, 1950   DOA: 12/14/2019  Referring Physician: Merton Border, MD  HPI: Jose Tucker is a 68 y.o. male seen for follow up of Acute on Chronic Respiratory Failure.  Patient currently is on pressure support has been on 28% FiO2 with good saturations right now  Medications: Reviewed on Rounds  Physical Exam:  Vitals: Temperature 98.6 pulse 84 respiratory rate 24 blood pressure is 137/75 saturations 98%  Ventilator Settings on pressure support FiO2 28% pressure poor 12 PEEP 5  . General: Comfortable at this time . Eyes: Grossly normal lids, irises & conjunctiva . ENT: grossly tongue is normal . Neck: no obvious mass . Cardiovascular: S1 S2 normal no gallop . Respiratory: No rhonchi no rales are noted at this time . Abdomen: soft . Skin: no rash seen on limited exam . Musculoskeletal: not rigid . Psychiatric:unable to assess . Neurologic: no seizure no involuntary movements         Lab Data:   Basic Metabolic Panel: Recent Labs  Lab 01/24/20 0729 01/26/20 0624 01/28/20 1002  NA 133* 132* 134*  K 3.7 4.0 4.0  CL 94* 95* 97*  CO2 26 26 27   GLUCOSE 120* 199* 144*  BUN 55* 56* 58*  CREATININE 3.66* 3.44* 3.25*  CALCIUM 8.9 8.7* 9.1  PHOS 4.3 4.5 3.8    ABG: No results for input(s): PHART, PCO2ART, PO2ART, HCO3, O2SAT in the last 168 hours.  Liver Function Tests: Recent Labs  Lab 01/24/20 0729 01/26/20 0624 01/28/20 1002  ALBUMIN 2.5* 2.7* 2.9*   No results for input(s): LIPASE, AMYLASE in the last 168 hours. No results for input(s): AMMONIA in the last 168 hours.  CBC: Recent Labs  Lab 01/24/20 0729 01/26/20 0624 01/28/20 1002  WBC 15.4* 13.6* 12.1*  HGB 8.4* 8.9* 9.3*  HCT 28.9* 30.8* 32.0*  MCV 106.6* 106.6* 105.6*  PLT 69* 75* 78*    Cardiac Enzymes: No results for  input(s): CKTOTAL, CKMB, CKMBINDEX, TROPONINI in the last 168 hours.  BNP (last 3 results) No results for input(s): BNP in the last 8760 hours.  ProBNP (last 3 results) No results for input(s): PROBNP in the last 8760 hours.  Radiological Exams: No results found.  Assessment/Plan Active Problems:   Acute on chronic respiratory failure with hypoxia (HCC)   Chronic systolic (congestive) heart failure (HCC)   End stage renal disease on dialysis [facility])   Chronic atrial fibrillation (HCC)   Bilateral pleural effusion   1. Acute on chronic respiratory failure hypoxia plan is to continue with pressure support weaning as tolerated.  Patient today will also be assessed for possibility of T collar 2. Chronic systolic heart failure right now is compensated we will continue to monitor 3. End-stage renal disease on hemodialysis 4. Chronic atrial fibrillation rate controlled 5. Bilateral pleural effusions no change we will continue to follow   I have personally seen and evaluated the patient, evaluated laboratory and imaging results, formulated the assessment and plan and placed orders. The Patient requires high complexity decision making with multiple systems involvement.  Rounds were done with the Respiratory Therapy Director and Staff therapists and discussed with nursing staff also.  Allyne Gee, MD [facility] Pulmonary Critical Care Medicine Sleep Medicine

## 2020-01-30 DIAGNOSIS — J9621 Acute and chronic respiratory failure with hypoxia: Secondary | ICD-10-CM | POA: Diagnosis not present

## 2020-01-30 DIAGNOSIS — I5022 Chronic systolic (congestive) heart failure: Secondary | ICD-10-CM | POA: Diagnosis not present

## 2020-01-30 DIAGNOSIS — I482 Chronic atrial fibrillation, unspecified: Secondary | ICD-10-CM | POA: Diagnosis not present

## 2020-01-30 DIAGNOSIS — J90 Pleural effusion, not elsewhere classified: Secondary | ICD-10-CM | POA: Diagnosis not present

## 2020-01-30 NOTE — Progress Notes (Signed)
Pulmonary [place]   PULMONARY CRITICAL CARE SERVICE  PROGRESS NOTE  Date of Service: 01/30/2020  Jose Tucker  VFI:[id number]  DOB: [birth year]   DOA: 12/25/2019  Referring Physician: Merton Border, MD  HPI: Jose Tucker is a 69 y.o. male seen for follow up of Acute on Chronic Respiratory Failure.  Patient currently is on pressure support is doing well did 3 hours of T collar yesterday we are going to reassess and try for another 4 hours or more of T collar today  Medications: Reviewed on Rounds  Physical Exam:  Vitals: Temperature is 96.6 pulse 75 respiratory 27 blood pressure 152/62 saturations 96%  Ventilator Settings on pressure support FiO2 28% pressure poor 12 PEEP 5  . General: Comfortable at this time . Eyes: Grossly normal lids, irises & conjunctiva . ENT: grossly tongue is normal . Neck: no obvious mass . Cardiovascular: S1 S2 normal no gallop . Respiratory: No rhonchi coarse breath sounds . Abdomen: soft . Skin: no rash seen on limited exam . Musculoskeletal: not rigid . Psychiatric:unable to assess . Neurologic: no seizure no involuntary movements         Lab Data:   Basic Metabolic Panel: Recent Labs  Lab 01/24/20 0729 01/26/20 0624 01/28/20 1002  NA 133* 132* 134*  K 3.7 4.0 4.0  CL 94* 95* 97*  CO2 26 26 27   GLUCOSE 120* 199* 144*  BUN 55* 56* 58*  CREATININE 3.66* 3.44* 3.25*  CALCIUM 8.9 8.7* 9.1  PHOS 4.3 4.5 3.8    ABG: No results for input(s): PHART, PCO2ART, PO2ART, HCO3, O2SAT in the last 168 hours.  Liver Function Tests: Recent Labs  Lab 01/24/20 0729 01/26/20 0624 01/28/20 1002  ALBUMIN 2.5* 2.7* 2.9*   No results for input(s): LIPASE, AMYLASE in the last 168 hours. No results for input(s): AMMONIA in the last 168 hours.  CBC: Recent Labs  Lab 01/24/20 0729 01/26/20 0624 01/28/20 1002  WBC 15.4* 13.6* 12.1*  HGB 8.4* 8.9* 9.3*  HCT 28.9* 30.8* 32.0*  MCV 106.6* 106.6* 105.6*   PLT 69* 75* 78*    Cardiac Enzymes: No results for input(s): CKTOTAL, CKMB, CKMBINDEX, TROPONINI in the last 168 hours.  BNP (last 3 results) No results for input(s): BNP in the last 8760 hours.  ProBNP (last 3 results) No results for input(s): PROBNP in the last 8760 hours.  Radiological Exams: No results found.  Assessment/Plan Active Problems:   Acute on chronic respiratory failure with hypoxia (HCC)   Chronic systolic (congestive) heart failure (HCC)   End stage renal disease on dialysis [facility])   Chronic atrial fibrillation (HCC)   Bilateral pleural effusion   1. Acute on chronic respiratory failure hypoxia plan is to continue with weaning on pressure support as tolerated patient did 3.75 hours yesterday today will be a goal of being on 4 hours hopefully 2. Chronic systolic heart failure compensated at this time we will continue with supportive care 3. End-stage renal disease on hemodialysis followed by nephrology 4. Chronic atrial fibrillation rate controlled 5. Bilateral pleural effusion treated we will continue to follow   I have personally seen and evaluated the patient, evaluated laboratory and imaging results, formulated the assessment and plan and placed orders. The Patient requires high complexity decision making with multiple systems involvement.  Rounds were done with the Respiratory Therapy Director and Staff therapists and discussed with nursing staff also.  Allyne Gee, MD [facility] Pulmonary Critical Care Medicine Sleep Medicine

## 2020-01-31 DIAGNOSIS — I5022 Chronic systolic (congestive) heart failure: Secondary | ICD-10-CM | POA: Diagnosis not present

## 2020-01-31 DIAGNOSIS — I482 Chronic atrial fibrillation, unspecified: Secondary | ICD-10-CM | POA: Diagnosis not present

## 2020-01-31 DIAGNOSIS — J90 Pleural effusion, not elsewhere classified: Secondary | ICD-10-CM | POA: Diagnosis not present

## 2020-01-31 DIAGNOSIS — J9621 Acute and chronic respiratory failure with hypoxia: Secondary | ICD-10-CM | POA: Diagnosis not present

## 2020-01-31 LAB — RENAL FUNCTION PANEL
Albumin: 2.8 g/dL — ABNORMAL LOW (ref 3.5–5.0)
Anion gap: 11 (ref 5–15)
BUN: 76 mg/dL — ABNORMAL HIGH (ref 8–23)
CO2: 28 mmol/L (ref 22–32)
Calcium: 9.1 mg/dL (ref 8.9–10.3)
Chloride: 94 mmol/L — ABNORMAL LOW (ref 98–111)
Creatinine, Ser: 3.4 mg/dL — ABNORMAL HIGH (ref 0.61–1.24)
GFR calc Af Amer: 20 mL/min — ABNORMAL LOW (ref >60)
GFR calc non Af Amer: 18 mL/min — ABNORMAL LOW (ref >60)
Glucose, Bld: 187 mg/dL — ABNORMAL HIGH (ref 70–99)
Phosphorus: 3.4 mg/dL (ref 2.5–4.6)
Potassium: 4.3 mmol/L (ref 3.5–5.1)
Sodium: 133 mmol/L — ABNORMAL LOW (ref 135–145)

## 2020-01-31 LAB — CBC
HCT: 29.4 % — ABNORMAL LOW (ref 39.0–52.0)
Hemoglobin: 8.6 g/dL — ABNORMAL LOW (ref 13.0–17.0)
MCH: 30.1 pg (ref 26.0–34.0)
MCHC: 29.3 g/dL — ABNORMAL LOW (ref 30.0–36.0)
MCV: 102.8 fL — ABNORMAL HIGH (ref 80.0–100.0)
Platelets: 72 10*3/uL — ABNORMAL LOW (ref 150–400)
RBC: 2.86 MIL/uL — ABNORMAL LOW (ref 4.22–5.81)
RDW: 20.7 % — ABNORMAL HIGH (ref 11.5–15.5)
WBC: 11.6 10*3/uL — ABNORMAL HIGH (ref 4.0–10.5)
nRBC: 0.2 % (ref 0.0–0.2)

## 2020-01-31 NOTE — Progress Notes (Signed)
Pulmonary [place]   PULMONARY CRITICAL CARE SERVICE  PROGRESS NOTE  Date of Service: 01/31/2020  Jose Tucker  HYI:[id number]  DOB: [birth year]   DOA: 12/10/2019  Referring Physician: Merton Border, MD  HPI: Jose Tucker is a 69 y.o. male seen for follow up of Acute on Chronic Respiratory Failure.  Patient currently is on pressure support has been on 28% FiO2 was attempted on T collar this morning did not tolerate.  Respiratory therapy will reassess again later today  Medications: Reviewed on Rounds  Physical Exam:  Vitals: Temperature is 96.3 pulse 74 respiratory 21 blood pressure is 142/66 saturations 97%  Ventilator Settings on pressure support FiO2 28% pressure 12 PEEP 5  . General: Comfortable at this time . Eyes: Grossly normal lids, irises & conjunctiva . ENT: grossly tongue is normal . Neck: no obvious mass . Cardiovascular: S1 S2 normal no gallop . Respiratory: No rhonchi coarse breath sounds are noted . Abdomen: soft . Skin: no rash seen on limited exam . Musculoskeletal: not rigid . Psychiatric:unable to assess . Neurologic: no seizure no involuntary movements         Lab Data:   Basic Metabolic Panel: Recent Labs  Lab 01/26/20 0624 01/28/20 1002 01/31/20 0714  NA 132* 134* 133*  K 4.0 4.0 4.3  CL 95* 97* 94*  CO2 26 27 28   GLUCOSE 199* 144* 187*  BUN 56* 58* 76*  CREATININE 3.44* 3.25* 3.40*  CALCIUM 8.7* 9.1 9.1  PHOS 4.5 3.8 3.4    ABG: No results for input(s): PHART, PCO2ART, PO2ART, HCO3, O2SAT in the last 168 hours.  Liver Function Tests: Recent Labs  Lab 01/26/20 0624 01/28/20 1002 01/31/20 0714  ALBUMIN 2.7* 2.9* 2.8*   No results for input(s): LIPASE, AMYLASE in the last 168 hours. No results for input(s): AMMONIA in the last 168 hours.  CBC: Recent Labs  Lab 01/26/20 0624 01/28/20 1002 01/31/20 0714  WBC 13.6* 12.1* 11.6*  HGB 8.9* 9.3* 8.6*  HCT 30.8* 32.0* 29.4*  MCV 106.6*  105.6* 102.8*  PLT 75* 78* 72*    Cardiac Enzymes: No results for input(s): CKTOTAL, CKMB, CKMBINDEX, TROPONINI in the last 168 hours.  BNP (last 3 results) No results for input(s): BNP in the last 8760 hours.  ProBNP (last 3 results) No results for input(s): PROBNP in the last 8760 hours.  Radiological Exams: No results found.  Assessment/Plan Active Problems:   Acute on chronic respiratory failure with hypoxia (HCC)   Chronic systolic (congestive) heart failure (HCC)   End stage renal disease on dialysis [facility])   Chronic atrial fibrillation (HCC)   Bilateral pleural effusion   1. Acute on chronic respiratory failure hypoxia plan is to continue with pressure support weaning as tolerated and again we will reattempt T collar 2. Chronic systolic heart failure is compensated continue with fluid removal per dialysis 3. End-stage renal disease for hemodialysis today 4. Chronic atrial fibrillation rate controlled 5. Bilateral effusions no change   I have personally seen and evaluated the patient, evaluated laboratory and imaging results, formulated the assessment and plan and placed orders. The Patient requires high complexity decision making with multiple systems involvement.  Rounds were done with the Respiratory Therapy Director and Staff therapists and discussed with nursing staff also.  Allyne Gee, MD [facility] Pulmonary Critical Care Medicine Sleep Medicine

## 2020-01-31 NOTE — Progress Notes (Signed)
[facility]  ROUNDING NOTE   Subjective:  Patient is due for hemodialysis today. Still on ventilatory support. Resting comfortably in bed at the moment.  Objective:  Vital signs in last 24 hours:  Temperature 96.3 pulse 74 respirations 26 blood pressure 142/66  Physical Exam: General: Critically ill-appearing  Head: Normocephalic, atraumatic. Moist oral mucosal membranes  Eyes: Anicteric  Neck: Tracheostomy in place  Lungs:  Scattered rhonchi bilateral, vent assisted  Heart: S1S2 irregular  Abdomen:  Soft, nontender, bowel sounds present  Extremities: Trace peripheral edema.  Neurologic: Awake, alert, following commands  Skin: Bilateral upper extremity ecchymoses  Access: Right IJ PermCath    Basic Metabolic Panel: Recent Labs  Lab 01/26/20 0624 01/28/20 1002  NA 132* 134*  K 4.0 4.0  CL 95* 97*  CO2 26 27  GLUCOSE 199* 144*  BUN 56* 58*  CREATININE 3.44* 3.25*  CALCIUM 8.7* 9.1  PHOS 4.5 3.8    Liver Function Tests: Recent Labs  Lab 01/26/20 0624 01/28/20 1002  ALBUMIN 2.7* 2.9*   No results for input(s): LIPASE, AMYLASE in the last 168 hours. No results for input(s): AMMONIA in the last 168 hours.  CBC: Recent Labs  Lab 01/26/20 0624 01/28/20 1002 01/31/20 0714  WBC 13.6* 12.1* 11.6*  HGB 8.9* 9.3* 8.6*  HCT 30.8* 32.0* 29.4*  MCV 106.6* 105.6* 102.8*  PLT 75* 78* 72*    Cardiac Enzymes: No results for input(s): CKTOTAL, CKMB, CKMBINDEX, TROPONINI in the last 168 hours.  BNP: Invalid input(s): POCBNP  CBG: No results for input(s): GLUCAP in the last 168 hours.  Microbiology: Results for orders placed or performed during the hospital encounter of 12/24/2019  C difficile quick scan w PCR reflex     Status: None   Collection Time: 12/18/19  4:49 AM   Specimen: STOOL  Result Value Ref Range Status   C Diff antigen NEGATIVE NEGATIVE Final   C Diff toxin NEGATIVE NEGATIVE Final   C Diff interpretation No C. difficile detected.   Corrected    Comment: Performed at [facility], [place] [address] [place], [place] [address] CORRECTED ON 03/13 AT 1510: PREVIOUSLY REPORTED AS VALID   Culture, respiratory (non-expectorated)     Status: None   Collection Time: 12/24/19  4:00 PM   Specimen: Tracheal Aspirate; Respiratory  Result Value Ref Range Status   Specimen Description TRACHEAL ASPIRATE  Final   Special Requests NONE  Final   Gram Stain   Final    ABUNDANT WBC PRESENT, PREDOMINANTLY PMN ABUNDANT GRAM POSITIVE RODS FEW GRAM NEGATIVE RODS Performed at [facility], [place] [address] [place], [place] [address]    Culture MODERATE PROTEUS MIRABILIS  Final   Report Status 12/26/2019 FINAL  Final   Organism ID, Bacteria PROTEUS MIRABILIS  Final      Susceptibility   Proteus mirabilis - MIC*    AMPICILLIN <=2 SENSITIVE Sensitive     CEFAZOLIN 8 SENSITIVE Sensitive     CEFEPIME <=0.12 SENSITIVE Sensitive     CEFTAZIDIME <=1 SENSITIVE Sensitive     CEFTRIAXONE <=0.25 SENSITIVE Sensitive     CIPROFLOXACIN <=0.25 SENSITIVE Sensitive     GENTAMICIN <=1 SENSITIVE Sensitive     IMIPENEM 4 SENSITIVE Sensitive     TRIMETH/SULFA <=20 SENSITIVE Sensitive     AMPICILLIN/SULBACTAM <=2 SENSITIVE Sensitive     PIP/TAZO <=4 SENSITIVE Sensitive     * MODERATE PROTEUS MIRABILIS  Culture, blood (routine x 2)     Status: None  Collection Time: 12/25/19  5:16 PM   Specimen: BLOOD  Result Value Ref Range Status   Specimen Description BLOOD RIGHT ANTECUBITAL  Final   Special Requests   Final    BOTTLES DRAWN AEROBIC AND ANAEROBIC Blood Culture adequate volume   Culture   Final    NO GROWTH 5 DAYS Performed at [facility], [address], [place], [place] [address]    Report Status 12/30/2019 FINAL  Final  Culture, blood (routine x 2)     Status: None   Collection Time: 12/25/19  5:18 PM   Specimen: BLOOD  Result Value Ref Range Status   Specimen Description BLOOD RIGHT ANTECUBITAL  Final   Special Requests    Final    BOTTLES DRAWN AEROBIC AND ANAEROBIC Blood Culture adequate volume   Culture   Final    NO GROWTH 5 DAYS Performed at [facility], [place] [address] [place], [place] [address]    Report Status 12/30/2019 FINAL  Final  C difficile quick scan w PCR reflex     Status: Abnormal   Collection Time: 12/26/19 12:11 PM   Specimen: STOOL  Result Value Ref Range Status   C Diff antigen POSITIVE (A) NEGATIVE Final   C Diff toxin POSITIVE (A) NEGATIVE Final    Comment: CRITICAL RESULT CALLED TO, READ BACK BY AND VERIFIED WITH:  RN CHRISSY R. @[id number]  12/26/2019 AKT    C Diff interpretation Toxin producing C. difficile detected.  Final  Culture, blood (routine x 2)     Status: None   Collection Time: 01/05/20 12:43 PM   Specimen: BLOOD  Result Value Ref Range Status   Specimen Description BLOOD RIGHT ANTECUBITAL  Final   Special Requests   Final    BOTTLES DRAWN AEROBIC AND ANAEROBIC Blood Culture adequate volume   Culture   Final    NO GROWTH 5 DAYS Performed at [facility], [address], [place], [place] [address]    Report Status 01/10/2020 FINAL  Final  Culture, respiratory (non-expectorated)     Status: None   Collection Time: 01/05/20  2:55 PM   Specimen: Tracheal Aspirate; Respiratory  Result Value Ref Range Status   Specimen Description TRACHEAL ASPIRATE  Final   Special Requests NONE  Final   Gram Stain   Final    ABUNDANT WBC PRESENT,BOTH PMN AND MONONUCLEAR ABUNDANT GRAM NEGATIVE RODS Performed at [facility], [place] [address], [place], [place] [address]    Culture   Final    ABUNDANT KLEBSIELLA PNEUMONIAE MODERATE PROTEUS MIRABILIS KLEBSIELLA PNEUMONIAE CONFIRMED CARBAPENEMASE RESISTANT ENTEROBACTERIACAE KLEBSIELLA PNEUMONIAE Confirmed Extended Spectrum Beta-Lactamase Producer (ESBL).  In bloodstream infections from ESBL organisms, carbapenems are preferred over piperacillin/tazobactam. They are shown to have a lower risk of mortality. KLEBSIELLA  PNEUMONIAE MULTI-DRUG RESISTANT ORGANISM    Report Status 01/11/2020 FINAL  Final   Organism ID, Bacteria PROTEUS MIRABILIS  Final   Organism ID, Bacteria KLEBSIELLA PNEUMONIAE  Final      Susceptibility   Klebsiella pneumoniae - MIC*    AMPICILLIN >=32 RESISTANT Resistant     CEFAZOLIN >=64 RESISTANT Resistant     CEFEPIME >=32 RESISTANT Resistant     CEFTAZIDIME >=64 RESISTANT Resistant     CEFTRIAXONE >=64 RESISTANT Resistant     CIPROFLOXACIN <=0.25 SENSITIVE Sensitive     GENTAMICIN >=16 RESISTANT Resistant     IMIPENEM >=16 RESISTANT Resistant     TRIMETH/SULFA >=320 RESISTANT Resistant     AMPICILLIN/SULBACTAM >=32  RESISTANT Resistant     PIP/TAZO >=128 RESISTANT Resistant     * ABUNDANT KLEBSIELLA PNEUMONIAE   Proteus mirabilis - MIC*    AMPICILLIN <=2 SENSITIVE Sensitive     CEFAZOLIN <=4 SENSITIVE Sensitive     CEFEPIME <=0.12 SENSITIVE Sensitive     CEFTAZIDIME <=1 SENSITIVE Sensitive     CEFTRIAXONE <=0.25 SENSITIVE Sensitive     CIPROFLOXACIN <=0.25 SENSITIVE Sensitive     GENTAMICIN <=1 SENSITIVE Sensitive     IMIPENEM 8 INTERMEDIATE Intermediate     TRIMETH/SULFA <=20 SENSITIVE Sensitive     AMPICILLIN/SULBACTAM <=2 SENSITIVE Sensitive     PIP/TAZO <=4 SENSITIVE Sensitive     * MODERATE PROTEUS MIRABILIS  Culture, blood (routine x 2)     Status: None   Collection Time: 01/05/20  3:04 PM   Specimen: BLOOD  Result Value Ref Range Status   Specimen Description BLOOD RIGHT ANTECUBITAL  Final   Special Requests   Final    BOTTLES DRAWN AEROBIC ONLY Blood Culture adequate volume   Culture   Final    NO GROWTH 5 DAYS Performed at [facility], [place] [address], [place], [place] [address]    Report Status 01/10/2020 FINAL  Final  Carbapenem Resistance Panel     Status: Abnormal   Collection Time: 01/05/20  3:08 PM  Result Value Ref Range Status   Carba Resistance IMP Gene NOT DETECTED NOT DETECTED Final   Carba Resistance VIM Gene NOT DETECTED NOT DETECTED  Final   Carba Resistance NDM Gene NOT DETECTED NOT DETECTED Final   Carba Resistance KPC Gene DETECTED (A) NOT DETECTED Final    Comment: CRITICAL RESULT CALLED TO, READ BACK BY AND VERIFIED WITH: RBV RN D. Fara Olden [id number] FCP    Carba Resistance OXA48 Gene NOT DETECTED NOT DETECTED Final    Comment: (NOTE) Cepheid Carba-R is an FDA-cleared nucleic acid amplification test  (NAAT)for the detection and differentiation of genes encoding the  most prevalent carbapenemases in bacterial isolate samples. Carbapenemase gene identification and implementation of comprehensive  infection control measures are recommended by the CDC to prevent the  spread of the resistant organisms. Performed at [facility], [place] [address] [place], [place] [address]     Coagulation Studies: No results for input(s): LABPROT, INR in the last 72 hours.  Urinalysis: No results for input(s): COLORURINE, LABSPEC, PHURINE, GLUCOSEU, HGBUR, BILIRUBINUR, KETONESUR, PROTEINUR, UROBILINOGEN, NITRITE, LEUKOCYTESUR in the last 72 hours.  Invalid input(s): APPERANCEUR    Imaging: No results found.   Medications:     iohexol  Assessment/ Plan:  69 y.o. male with a PMHx of ESRD on HD, CHF, aortic stenosis, hypertension, diabetes mellitus type 2, hyperlipidemia, obstructive sleep apnea, chronic venous stasis with LE edema, and obesity who was admitted to [facility] on 01/04/2020 for ongoing treatment of respiratory failure, ESRD, malnutrition, and generalized debility.    1.  ESRD on HD MWF.    Patient will continue on MWF dialysis schedule.  2.  Acute respiratory failure.    Patient remained stable on vent support at this time.  Continue mechanical ventilation at this time.  3.  Secondary hyperparathyroidism.    Phosphorus at target at 3.8.  Continue to periodically monitor.  4.  Anemia of chronic kidney disease.  Hemoglobin down slightly to 8.6.  We will maintain the patient on Retacrit 4000 units IV  with dialysis.    LOS: 0 Jose Tucker 4/26/20218:15 AM

## 2020-02-01 DIAGNOSIS — I5022 Chronic systolic (congestive) heart failure: Secondary | ICD-10-CM | POA: Diagnosis not present

## 2020-02-01 DIAGNOSIS — J9621 Acute and chronic respiratory failure with hypoxia: Secondary | ICD-10-CM | POA: Diagnosis not present

## 2020-02-01 DIAGNOSIS — J90 Pleural effusion, not elsewhere classified: Secondary | ICD-10-CM | POA: Diagnosis not present

## 2020-02-01 DIAGNOSIS — I482 Chronic atrial fibrillation, unspecified: Secondary | ICD-10-CM | POA: Diagnosis not present

## 2020-02-01 NOTE — Progress Notes (Signed)
[facility]   PULMONARY CRITICAL CARE SERVICE  PROGRESS NOTE  Date of Service: 02/01/2020  Jose Tucker  FAO:[id number]  DOB: [birth year]   DOA: 12/21/2019  Referring Physician: Merton Border, MD  HPI: Jose Tucker is a 69 y.o. male seen for follow up of Acute on Chronic Respiratory Failure.  Patient right now is on pressure support has been on 28% FiO2 pressures of 12/5  Medications: Reviewed on Rounds  Physical Exam:  Vitals: Temperature 97.8 pulse 87 respiratory 21 blood pressure is 146/86 saturations 96%  Ventilator Settings pressure support FiO2 28% pressure 12 PEEP 5  . General: Comfortable at this time . Eyes: Grossly normal lids, irises & conjunctiva . ENT: grossly tongue is normal . Neck: no obvious mass . Cardiovascular: S1 S2 normal no gallop . Respiratory: Scattered rhonchi expansion is equal . Abdomen: soft . Skin: no rash seen on limited exam . Musculoskeletal: not rigid . Psychiatric:unable to assess . Neurologic: no seizure no involuntary movements         Lab Data:   Basic Metabolic Panel: Recent Labs  Lab 01/26/20 0624 01/28/20 1002 01/31/20 0714  NA 132* 134* 133*  K 4.0 4.0 4.3  CL 95* 97* 94*  CO2 26 27 28   GLUCOSE 199* 144* 187*  BUN 56* 58* 76*  CREATININE 3.44* 3.25* 3.40*  CALCIUM 8.7* 9.1 9.1  PHOS 4.5 3.8 3.4    ABG: No results for input(s): PHART, PCO2ART, PO2ART, HCO3, O2SAT in the last 168 hours.  Liver Function Tests: Recent Labs  Lab 01/26/20 0624 01/28/20 1002 01/31/20 0714  ALBUMIN 2.7* 2.9* 2.8*   No results for input(s): LIPASE, AMYLASE in the last 168 hours. No results for input(s): AMMONIA in the last 168 hours.  CBC: Recent Labs  Lab 01/26/20 0624 01/28/20 1002 01/31/20 0714  WBC 13.6* 12.1* 11.6*  HGB 8.9* 9.3* 8.6*  HCT 30.8* 32.0* 29.4*  MCV 106.6* 105.6* 102.8*  PLT 75* 78* 72*    Cardiac Enzymes: No results for input(s): CKTOTAL, CKMB,  CKMBINDEX, TROPONINI in the last 168 hours.  BNP (last 3 results) No results for input(s): BNP in the last 8760 hours.  ProBNP (last 3 results) No results for input(s): PROBNP in the last 8760 hours.  Radiological Exams: No results found.  Assessment/Plan Active Problems:   Acute on chronic respiratory failure with hypoxia (HCC)   Chronic systolic (congestive) heart failure (HCC)   End stage renal disease on dialysis [facility])   Chronic atrial fibrillation (HCC)   Bilateral pleural effusion   1. Acute on chronic respiratory failure hypoxia plan is to continue to wean on pressure support as tolerated titrate oxygen continue pulmonary toilet. 2. Chronic systolic heart failure compensated continue with present management 3. End-stage renal disease on hemodialysis followed by nephrology 4. Chronic atrial fibrillation rate controlled 5. Bilateral effusions continue to follow   I have personally seen and evaluated the patient, evaluated laboratory and imaging results, formulated the assessment and plan and placed orders. The Patient requires high complexity decision making with multiple systems involvement.  Rounds were done with the Respiratory Therapy Director and Staff therapists and discussed with nursing staff also.  Allyne Gee, MD [facility] Pulmonary Critical Care Medicine Sleep Medicine

## 2020-02-02 DIAGNOSIS — I5022 Chronic systolic (congestive) heart failure: Secondary | ICD-10-CM | POA: Diagnosis not present

## 2020-02-02 DIAGNOSIS — J9621 Acute and chronic respiratory failure with hypoxia: Secondary | ICD-10-CM | POA: Diagnosis not present

## 2020-02-02 DIAGNOSIS — I482 Chronic atrial fibrillation, unspecified: Secondary | ICD-10-CM | POA: Diagnosis not present

## 2020-02-02 DIAGNOSIS — J90 Pleural effusion, not elsewhere classified: Secondary | ICD-10-CM | POA: Diagnosis not present

## 2020-02-02 LAB — RENAL FUNCTION PANEL
Albumin: 3 g/dL — ABNORMAL LOW (ref 3.5–5.0)
Anion gap: 12 (ref 5–15)
BUN: 68 mg/dL — ABNORMAL HIGH (ref 8–23)
CO2: 26 mmol/L (ref 22–32)
Calcium: 9.2 mg/dL (ref 8.9–10.3)
Chloride: 95 mmol/L — ABNORMAL LOW (ref 98–111)
Creatinine, Ser: 3.33 mg/dL — ABNORMAL HIGH (ref 0.61–1.24)
GFR calc Af Amer: 21 mL/min — ABNORMAL LOW (ref >60)
GFR calc non Af Amer: 18 mL/min — ABNORMAL LOW (ref >60)
Glucose, Bld: 132 mg/dL — ABNORMAL HIGH (ref 70–99)
Phosphorus: 4.2 mg/dL (ref 2.5–4.6)
Potassium: 4.2 mmol/L (ref 3.5–5.1)
Sodium: 133 mmol/L — ABNORMAL LOW (ref 135–145)

## 2020-02-02 LAB — CBC
HCT: 28.6 % — ABNORMAL LOW (ref 39.0–52.0)
Hemoglobin: 8.4 g/dL — ABNORMAL LOW (ref 13.0–17.0)
MCH: 30.4 pg (ref 26.0–34.0)
MCHC: 29.4 g/dL — ABNORMAL LOW (ref 30.0–36.0)
MCV: 103.6 fL — ABNORMAL HIGH (ref 80.0–100.0)
Platelets: 85 10*3/uL — ABNORMAL LOW (ref 150–400)
RBC: 2.76 MIL/uL — ABNORMAL LOW (ref 4.22–5.81)
RDW: 20.7 % — ABNORMAL HIGH (ref 11.5–15.5)
WBC: 12.8 10*3/uL — ABNORMAL HIGH (ref 4.0–10.5)
nRBC: 0.4 % — ABNORMAL HIGH (ref 0.0–0.2)

## 2020-02-02 NOTE — Progress Notes (Signed)
[facility]   PULMONARY CRITICAL CARE SERVICE  PROGRESS NOTE  Date of Service: 02/02/2020  Jose Tucker  CHY:[id number]  DOB: Dec 23, 1950   DOA: 01/03/2020  Referring Physician: Merton Border, MD  HPI: Jose Tucker is a 69 y.o. male seen for follow up of Acute on Chronic Respiratory Failure.  Patient currently is on pressure support of 28% FiO2 currently on 12/5 good volumes are noted  Medications: Reviewed on Rounds  Physical Exam:  Vitals: Temperature is 97.3 pulse 93 respiratory rate 31 blood pressure is 141/75 saturations 95%  Ventilator Settings on pressure support FiO2 28% pressure poor 12 PEEP 5 tidal volume 564  . General: Comfortable at this time . Eyes: Grossly normal lids, irises & conjunctiva . ENT: grossly tongue is normal . Neck: no obvious mass . Cardiovascular: S1 S2 normal no gallop . Respiratory: No rhonchi no rales are noted . Abdomen: soft . Skin: no rash seen on limited exam . Musculoskeletal: not rigid . Psychiatric:unable to assess . Neurologic: no seizure no involuntary movements         Lab Data:   Basic Metabolic Panel: Recent Labs  Lab 01/28/20 1002 01/31/20 0714 02/02/20 0802  NA 134* 133* 133*  K 4.0 4.3 4.2  CL 97* 94* 95*  CO2 27 28 26   GLUCOSE 144* 187* 132*  BUN 58* 76* 68*  CREATININE 3.25* 3.40* 3.33*  CALCIUM 9.1 9.1 9.2  PHOS 3.8 3.4 4.2    ABG: No results for input(s): PHART, PCO2ART, PO2ART, HCO3, O2SAT in the last 168 hours.  Liver Function Tests: Recent Labs  Lab 01/28/20 1002 01/31/20 0714 02/02/20 0802  ALBUMIN 2.9* 2.8* 3.0*   No results for input(s): LIPASE, AMYLASE in the last 168 hours. No results for input(s): AMMONIA in the last 168 hours.  CBC: Recent Labs  Lab 01/28/20 1002 01/31/20 0714 02/02/20 0802  WBC 12.1* 11.6* 12.8*  HGB 9.3* 8.6* 8.4*  HCT 32.0* 29.4* 28.6*  MCV 105.6* 102.8* 103.6*  PLT 78* 72* 85*    Cardiac Enzymes: No  results for input(s): CKTOTAL, CKMB, CKMBINDEX, TROPONINI in the last 168 hours.  BNP (last 3 results) No results for input(s): BNP in the last 8760 hours.  ProBNP (last 3 results) No results for input(s): PROBNP in the last 8760 hours.  Radiological Exams: No results found.  Assessment/Plan Active Problems:   Acute on chronic respiratory failure with hypoxia (HCC)   Chronic systolic (congestive) heart failure (HCC)   End stage renal disease on dialysis [facility])   Chronic atrial fibrillation (HCC)   Bilateral pleural effusion   1. Acute on chronic respiratory failure hypoxia continue with pressure support continue secretion management supportive care. 2. Chronic systolic heart failure compensated at this time 3. End-stage renal disease on dialysis 4. Chronic atrial fibrillation rate controlled 5. Bilateral pleural effusions no change we will continue to follow   I have personally seen and evaluated the patient, evaluated laboratory and imaging results, formulated the assessment and plan and placed orders. The Patient requires high complexity decision making with multiple systems involvement.  Rounds were done with the Respiratory Therapy Director and Staff therapists and discussed with nursing staff also.  Allyne Gee, MD [facility] Pulmonary Critical Care Medicine Sleep Medicine

## 2020-02-02 NOTE — Progress Notes (Signed)
[facility]  ROUNDING NOTE   Subjective:  Patient seen and evaluated during hemodialysis. Tolerating well. Ultrafiltration target 1.5 kg today.  Objective:  Vital signs in last 24 hours:  Temperature 97.3 pulse 93 respirations 31 blood pressure 141/75  Physical Exam: General: Critically ill-appearing  Head: Normocephalic, atraumatic. Moist oral mucosal membranes  Eyes: Anicteric  Neck: Tracheostomy in place  Lungs:  Scattered rhonchi bilateral, vent assisted  Heart: S1S2 irregular  Abdomen:  Soft, nontender, bowel sounds present  Extremities: Trace peripheral edema.  Neurologic: Awake, alert, following commands  Skin: Bilateral upper extremity ecchymoses  Access: Right IJ PermCath    Basic Metabolic Panel: Recent Labs  Lab 01/28/20 1002 01/31/20 0714 02/02/20 0802  NA 134* 133* 133*  K 4.0 4.3 4.2  CL 97* 94* 95*  CO2 27 28 26   GLUCOSE 144* 187* 132*  BUN 58* 76* 68*  CREATININE 3.25* 3.40* 3.33*  CALCIUM 9.1 9.1 9.2  PHOS 3.8 3.4 4.2    Liver Function Tests: Recent Labs  Lab 01/28/20 1002 01/31/20 0714 02/02/20 0802  ALBUMIN 2.9* 2.8* 3.0*   No results for input(s): LIPASE, AMYLASE in the last 168 hours. No results for input(s): AMMONIA in the last 168 hours.  CBC: Recent Labs  Lab 01/28/20 1002 01/31/20 0714 02/02/20 0802  WBC 12.1* 11.6* 12.8*  HGB 9.3* 8.6* 8.4*  HCT 32.0* 29.4* 28.6*  MCV 105.6* 102.8* 103.6*  PLT 78* 72* 85*    Cardiac Enzymes: No results for input(s): CKTOTAL, CKMB, CKMBINDEX, TROPONINI in the last 168 hours.  BNP: Invalid input(s): POCBNP  CBG: No results for input(s): GLUCAP in the last 168 hours.  Microbiology: Results for orders placed or performed during the hospital encounter of 12/30/2019  C difficile quick scan w PCR reflex     Status: None   Collection Time: 12/18/19  4:49 AM   Specimen: STOOL  Result Value Ref Range Status   C Diff antigen NEGATIVE NEGATIVE Final   C Diff toxin NEGATIVE  NEGATIVE Final   C Diff interpretation No C. difficile detected.  Corrected    Comment: Performed at [facility], [place] [address] [place], [place] [address] CORRECTED ON 03/13 AT 1510: PREVIOUSLY REPORTED AS VALID   Culture, respiratory (non-expectorated)     Status: None   Collection Time: 12/24/19  4:00 PM   Specimen: Tracheal Aspirate; Respiratory  Result Value Ref Range Status   Specimen Description TRACHEAL ASPIRATE  Final   Special Requests NONE  Final   Gram Stain   Final    ABUNDANT WBC PRESENT, PREDOMINANTLY PMN ABUNDANT GRAM POSITIVE RODS FEW GRAM NEGATIVE RODS Performed at [facility], [place] [address] [place], [place] [address]    Culture MODERATE PROTEUS MIRABILIS  Final   Report Status 12/26/2019 FINAL  Final   Organism ID, Bacteria PROTEUS MIRABILIS  Final      Susceptibility   Proteus mirabilis - MIC*    AMPICILLIN <=2 SENSITIVE Sensitive     CEFAZOLIN 8 SENSITIVE Sensitive     CEFEPIME <=0.12 SENSITIVE Sensitive     CEFTAZIDIME <=1 SENSITIVE Sensitive     CEFTRIAXONE <=0.25 SENSITIVE Sensitive     CIPROFLOXACIN <=0.25 SENSITIVE Sensitive     GENTAMICIN <=1 SENSITIVE Sensitive     IMIPENEM 4 SENSITIVE Sensitive     TRIMETH/SULFA <=20 SENSITIVE Sensitive     AMPICILLIN/SULBACTAM <=2 SENSITIVE Sensitive     PIP/TAZO <=4 SENSITIVE Sensitive     * MODERATE PROTEUS MIRABILIS  Culture,  blood (routine x 2)     Status: None   Collection Time: 12/25/19  5:16 PM   Specimen: BLOOD  Result Value Ref Range Status   Specimen Description BLOOD RIGHT ANTECUBITAL  Final   Special Requests   Final    BOTTLES DRAWN AEROBIC AND ANAEROBIC Blood Culture adequate volume   Culture   Final    NO GROWTH 5 DAYS Performed at [facility], [address], [place], [place] [address]    Report Status 12/30/2019 FINAL  Final  Culture, blood (routine x 2)     Status: None   Collection Time: 12/25/19  5:18 PM   Specimen: BLOOD  Result Value Ref Range Status    Specimen Description BLOOD RIGHT ANTECUBITAL  Final   Special Requests   Final    BOTTLES DRAWN AEROBIC AND ANAEROBIC Blood Culture adequate volume   Culture   Final    NO GROWTH 5 DAYS Performed at [facility], [place] [address], [place], [place] [address]    Report Status 12/30/2019 FINAL  Final  C difficile quick scan w PCR reflex     Status: Abnormal   Collection Time: 12/26/19 12:11 PM   Specimen: STOOL  Result Value Ref Range Status   C Diff antigen POSITIVE (A) NEGATIVE Final   C Diff toxin POSITIVE (A) NEGATIVE Final    Comment: CRITICAL RESULT CALLED TO, READ BACK BY AND VERIFIED WITH:  RN CHRISSY R. @1817  12/26/2019 AKT    C Diff interpretation Toxin producing C. difficile detected.  Final  Culture, blood (routine x 2)     Status: None   Collection Time: 01/05/20 12:43 PM   Specimen: BLOOD  Result Value Ref Range Status   Specimen Description BLOOD RIGHT ANTECUBITAL  Final   Special Requests   Final    BOTTLES DRAWN AEROBIC AND ANAEROBIC Blood Culture adequate volume   Culture   Final    NO GROWTH 5 DAYS Performed at [facility], [address] [place], [place] [address]    Report Status 01/10/2020 FINAL  Final  Culture, respiratory (non-expectorated)     Status: None   Collection Time: 01/05/20  2:55 PM   Specimen: Tracheal Aspirate; Respiratory  Result Value Ref Range Status   Specimen Description TRACHEAL ASPIRATE  Final   Special Requests NONE  Final   Gram Stain   Final    ABUNDANT WBC PRESENT,BOTH PMN AND MONONUCLEAR ABUNDANT GRAM NEGATIVE RODS Performed at [facility], [place] [address] [place],  [address]    Culture   Final    ABUNDANT KLEBSIELLA PNEUMONIAE MODERATE PROTEUS MIRABILIS KLEBSIELLA PNEUMONIAE CONFIRMED CARBAPENEMASE RESISTANT ENTEROBACTERIACAE KLEBSIELLA PNEUMONIAE Confirmed Extended Spectrum Beta-Lactamase Producer (ESBL).  In bloodstream infections from ESBL organisms, carbapenems are preferred over  piperacillin/tazobactam. They are shown to have a lower risk of mortality. KLEBSIELLA PNEUMONIAE MULTI-DRUG RESISTANT ORGANISM    Report Status 01/11/2020 FINAL  Final   Organism ID, Bacteria PROTEUS MIRABILIS  Final   Organism ID, Bacteria KLEBSIELLA PNEUMONIAE  Final      Susceptibility   Klebsiella pneumoniae - MIC*    AMPICILLIN >=32 RESISTANT Resistant     CEFAZOLIN >=64 RESISTANT Resistant     CEFEPIME >=32 RESISTANT Resistant     CEFTAZIDIME >=64 RESISTANT Resistant     CEFTRIAXONE >=64 RESISTANT Resistant     CIPROFLOXACIN <=0.25 SENSITIVE Sensitive     GENTAMICIN >=16 RESISTANT Resistant     IMIPENEM >=16 RESISTANT Resistant  TRIMETH/SULFA >=320 RESISTANT Resistant     AMPICILLIN/SULBACTAM >=32 RESISTANT Resistant     PIP/TAZO >=128 RESISTANT Resistant     * ABUNDANT KLEBSIELLA PNEUMONIAE   Proteus mirabilis - MIC*    AMPICILLIN <=2 SENSITIVE Sensitive     CEFAZOLIN <=4 SENSITIVE Sensitive     CEFEPIME <=0.12 SENSITIVE Sensitive     CEFTAZIDIME <=1 SENSITIVE Sensitive     CEFTRIAXONE <=0.25 SENSITIVE Sensitive     CIPROFLOXACIN <=0.25 SENSITIVE Sensitive     GENTAMICIN <=1 SENSITIVE Sensitive     IMIPENEM 8 INTERMEDIATE Intermediate     TRIMETH/SULFA <=20 SENSITIVE Sensitive     AMPICILLIN/SULBACTAM <=2 SENSITIVE Sensitive     PIP/TAZO <=4 SENSITIVE Sensitive     * MODERATE PROTEUS MIRABILIS  Culture, blood (routine x 2)     Status: None   Collection Time: 01/05/20  3:04 PM   Specimen: BLOOD  Result Value Ref Range Status   Specimen Description BLOOD RIGHT ANTECUBITAL  Final   Special Requests   Final    BOTTLES DRAWN AEROBIC ONLY Blood Culture adequate volume   Culture   Final    NO GROWTH 5 DAYS Performed at [facility], [place] [address] [place], [place] [address]    Report Status 01/10/2020 FINAL  Final  Carbapenem Resistance Panel     Status: Abnormal   Collection Time: 01/05/20  3:08 PM  Result Value Ref Range Status   Carba Resistance IMP Gene  NOT DETECTED NOT DETECTED Final   Carba Resistance VIM Gene NOT DETECTED NOT DETECTED Final   Carba Resistance NDM Gene NOT DETECTED NOT DETECTED Final   Carba Resistance KPC Gene DETECTED (A) NOT DETECTED Final    Comment: CRITICAL RESULT CALLED TO, READ BACK BY AND VERIFIED WITH: RBV RN D. Fara Olden [phone] FCP    Carba Resistance OXA48 Gene NOT DETECTED NOT DETECTED Final    Comment: (NOTE) Cepheid Carba-R is an FDA-cleared nucleic acid amplification test  (NAAT)for the detection and differentiation of genes encoding the  most prevalent carbapenemases in bacterial isolate samples. Carbapenemase gene identification and implementation of comprehensive  infection control measures are recommended by the CDC to prevent the  spread of the resistant organisms. Performed at [facility], [place] [address], [place], [place] [address]     Coagulation Studies: No results for input(s): LABPROT, INR in the last 72 hours.  Urinalysis: No results for input(s): COLORURINE, LABSPEC, PHURINE, GLUCOSEU, HGBUR, BILIRUBINUR, KETONESUR, PROTEINUR, UROBILINOGEN, NITRITE, LEUKOCYTESUR in the last 72 hours.  Invalid input(s): APPERANCEUR    Imaging: No results found.   Medications:     iohexol  Assessment/ Plan:  69 y.o. male with a PMHx of ESRD on HD, CHF, aortic stenosis, hypertension, diabetes mellitus type 2, hyperlipidemia, obstructive sleep apnea, chronic venous stasis with LE edema, and obesity who was admitted to [facility] on 12/17/2019 for ongoing treatment of respiratory failure, ESRD, malnutrition, and generalized debility.    1.  ESRD on HD MWF.    Patient seen and evaluated during dialysis treatment today.  Tolerating well.  Complete dialysis treatment today and next treatment on Friday.  2.  Acute respiratory failure.    Continue ventilatory support as per pulmonary/critical care.  3.  Secondary hyperparathyroidism.    Phosphorus remains under good control with ongoing  dialysis treatments.  Phosphorus currently 4.2.  4.  Anemia of chronic kidney disease.  Hemoglobin relatively stable at 8.4.  Maintain the patient on Retacrit 4000 IV with dialysis.  LOS: 0 Rannie Craney 4/28/202110:36 AM

## 2020-02-03 DIAGNOSIS — I482 Chronic atrial fibrillation, unspecified: Secondary | ICD-10-CM | POA: Diagnosis not present

## 2020-02-03 DIAGNOSIS — I5022 Chronic systolic (congestive) heart failure: Secondary | ICD-10-CM | POA: Diagnosis not present

## 2020-02-03 DIAGNOSIS — J9621 Acute and chronic respiratory failure with hypoxia: Secondary | ICD-10-CM | POA: Diagnosis not present

## 2020-02-03 DIAGNOSIS — J90 Pleural effusion, not elsewhere classified: Secondary | ICD-10-CM | POA: Diagnosis not present

## 2020-02-03 NOTE — Progress Notes (Signed)
[facility]   PULMONARY CRITICAL CARE SERVICE  PROGRESS NOTE  Date of Service: 02/03/2020  Jose Tucker  NUU:[id number]  DOB: 09-14-1951   DOA: 12/17/2019  Referring Physician: Merton Border, MD  HPI: Jose Tucker is a 69 y.o. male seen for follow up of Acute on Chronic Respiratory Failure.  Right now is on pressure support 12/5 has been requiring 28% FiO2  Medications: Reviewed on Rounds  Physical Exam:  Vitals: Temperature is 97.0 pulse 85 respiratory 16 blood pressure is 102/71 saturations 94%  Ventilator Settings on pressure support FiO2 28% pressure poor 12 PEEP 5  . General: Comfortable at this time . Eyes: Grossly normal lids, irises & conjunctiva . ENT: grossly tongue is normal . Neck: no obvious mass . Cardiovascular: S1 S2 normal no gallop . Respiratory: No rhonchi coarse breath sounds . Abdomen: soft . Skin: no rash seen on limited exam . Musculoskeletal: not rigid . Psychiatric:unable to assess . Neurologic: no seizure no involuntary movements         Lab Data:   Basic Metabolic Panel: Recent Labs  Lab 01/28/20 1002 01/31/20 0714 02/02/20 0802  NA 134* 133* 133*  K 4.0 4.3 4.2  CL 97* 94* 95*  CO2 27 28 26   GLUCOSE 144* 187* 132*  BUN 58* 76* 68*  CREATININE 3.25* 3.40* 3.33*  CALCIUM 9.1 9.1 9.2  PHOS 3.8 3.4 4.2    ABG: No results for input(s): PHART, PCO2ART, PO2ART, HCO3, O2SAT in the last 168 hours.  Liver Function Tests: Recent Labs  Lab 01/28/20 1002 01/31/20 0714 02/02/20 0802  ALBUMIN 2.9* 2.8* 3.0*   No results for input(s): LIPASE, AMYLASE in the last 168 hours. No results for input(s): AMMONIA in the last 168 hours.  CBC: Recent Labs  Lab 01/28/20 1002 01/31/20 0714 02/02/20 0802  WBC 12.1* 11.6* 12.8*  HGB 9.3* 8.6* 8.4*  HCT 32.0* 29.4* 28.6*  MCV 105.6* 102.8* 103.6*  PLT 78* 72* 85*    Cardiac Enzymes: No results for input(s): CKTOTAL, CKMB, CKMBINDEX,  TROPONINI in the last 168 hours.  BNP (last 3 results) No results for input(s): BNP in the last 8760 hours.  ProBNP (last 3 results) No results for input(s): PROBNP in the last 8760 hours.  Radiological Exams: No results found.  Assessment/Plan Active Problems:   Acute on chronic respiratory failure with hypoxia (HCC)   Chronic systolic (congestive) heart failure (HCC)   End stage renal disease on dialysis [facility])   Chronic atrial fibrillation (HCC)   Bilateral pleural effusion   1. Acute on chronic respiratory failure hypoxia patient right now is on pressure support has been on 28% FiO2 requiring 12/5.  Spoke with respiratory therapy to try to start weaning on T collar 2. Chronic congestive heart failure systolic dysfunction supportive care monitor fluid status 3. End-stage renal disease followed by nephrology 4. Chronic atrial fibrillation rate controlled 5. Bilateral effusions we will continue with supportive care   I have personally seen and evaluated the patient, evaluated laboratory and imaging results, formulated the assessment and plan and placed orders. The Patient requires high complexity decision making with multiple systems involvement.  Rounds were done with the Respiratory Therapy Director and Staff therapists and discussed with nursing staff also.  Allyne Gee, MD [facility] Pulmonary Critical Care Medicine Sleep Medicine

## 2020-02-04 DIAGNOSIS — I482 Chronic atrial fibrillation, unspecified: Secondary | ICD-10-CM | POA: Diagnosis not present

## 2020-02-04 DIAGNOSIS — J9621 Acute and chronic respiratory failure with hypoxia: Secondary | ICD-10-CM | POA: Diagnosis not present

## 2020-02-04 DIAGNOSIS — J90 Pleural effusion, not elsewhere classified: Secondary | ICD-10-CM | POA: Diagnosis not present

## 2020-02-04 DIAGNOSIS — I5022 Chronic systolic (congestive) heart failure: Secondary | ICD-10-CM | POA: Diagnosis not present

## 2020-02-04 LAB — RENAL FUNCTION PANEL
Albumin: 3 g/dL — ABNORMAL LOW (ref 3.5–5.0)
Anion gap: 13 (ref 5–15)
BUN: 65 mg/dL — ABNORMAL HIGH (ref 8–23)
CO2: 24 mmol/L (ref 22–32)
Calcium: 9.1 mg/dL (ref 8.9–10.3)
Chloride: 99 mmol/L (ref 98–111)
Creatinine, Ser: 3.31 mg/dL — ABNORMAL HIGH (ref 0.61–1.24)
GFR calc Af Amer: 21 mL/min — ABNORMAL LOW (ref >60)
GFR calc non Af Amer: 18 mL/min — ABNORMAL LOW (ref >60)
Glucose, Bld: 138 mg/dL — ABNORMAL HIGH (ref 70–99)
Phosphorus: 4.3 mg/dL (ref 2.5–4.6)
Potassium: 4.5 mmol/L (ref 3.5–5.1)
Sodium: 136 mmol/L (ref 135–145)

## 2020-02-04 LAB — CBC
HCT: 32.2 % — ABNORMAL LOW (ref 39.0–52.0)
Hemoglobin: 9.6 g/dL — ABNORMAL LOW (ref 13.0–17.0)
MCH: 30.9 pg (ref 26.0–34.0)
MCHC: 29.8 g/dL — ABNORMAL LOW (ref 30.0–36.0)
MCV: 103.5 fL — ABNORMAL HIGH (ref 80.0–100.0)
Platelets: 65 10*3/uL — ABNORMAL LOW (ref 150–400)
RBC: 3.11 MIL/uL — ABNORMAL LOW (ref 4.22–5.81)
RDW: 20.2 % — ABNORMAL HIGH (ref 11.5–15.5)
WBC: 12.5 10*3/uL — ABNORMAL HIGH (ref 4.0–10.5)
nRBC: 0.4 % — ABNORMAL HIGH (ref 0.0–0.2)

## 2020-02-04 NOTE — Progress Notes (Signed)
[facility]  ROUNDING NOTE   Subjective:  Patient resting in bed comfortably. Currently on the ventilator. Due for dialysis treatment today.  Objective:  Vital signs in last 24 hours:  Temperature 96 pulse 69 respirations 14 blood pressure 121/54  Physical Exam: General: Critically ill-appearing  Head: Normocephalic, atraumatic. Moist oral mucosal membranes  Eyes: Anicteric  Neck: Tracheostomy in place  Lungs:  Scattered rhonchi bilateral, vent assisted  Heart: S1S2 irregular  Abdomen:  Soft, nontender, bowel sounds present  Extremities: Trace peripheral edema.  Neurologic: Awake, alert, following commands  Skin: Bilateral upper extremity ecchymoses  Access: Right IJ PermCath    Basic Metabolic Panel: Recent Labs  Lab 01/28/20 1002 01/28/20 1002 01/31/20 0714 02/02/20 0802 02/04/20 0554  NA 134*  --  133* 133* 136  K 4.0  --  4.3 4.2 4.5  CL 97*  --  94* 95* 99  CO2 27  --  28 26 24   GLUCOSE 144*  --  187* 132* 138*  BUN 58*  --  76* 68* 65*  CREATININE 3.25*  --  3.40* 3.33* 3.31*  CALCIUM 9.1   < > 9.1 9.2 9.1  PHOS 3.8  --  3.4 4.2 4.3   < > = values in this interval not displayed.    Liver Function Tests: Recent Labs  Lab 01/28/20 1002 01/31/20 0714 02/02/20 0802 02/04/20 0554  ALBUMIN 2.9* 2.8* 3.0* 3.0*   No results for input(s): LIPASE, AMYLASE in the last 168 hours. No results for input(s): AMMONIA in the last 168 hours.  CBC: Recent Labs  Lab 01/28/20 1002 01/31/20 0714 02/02/20 0802 02/04/20 0554  WBC 12.1* 11.6* 12.8* 12.5*  HGB 9.3* 8.6* 8.4* 9.6*  HCT 32.0* 29.4* 28.6* 32.2*  MCV 105.6* 102.8* 103.6* 103.5*  PLT 78* 72* 85* 65*    Cardiac Enzymes: No results for input(s): CKTOTAL, CKMB, CKMBINDEX, TROPONINI in the last 168 hours.  BNP: Invalid input(s): POCBNP  CBG: No results for input(s): GLUCAP in the last 168 hours.  Microbiology: Results for orders placed or performed during the hospital encounter of  12/30/2019  C difficile quick scan w PCR reflex     Status: None   Collection Time: 12/18/19  4:49 AM   Specimen: STOOL  Result Value Ref Range Status   C Diff antigen NEGATIVE NEGATIVE Final   C Diff toxin NEGATIVE NEGATIVE Final   C Diff interpretation No C. difficile detected.  Corrected    Comment: Performed at [facility], [place] [address], [place], [place] [address] CORRECTED ON 03/13 AT 1510: PREVIOUSLY REPORTED AS VALID   Culture, respiratory (non-expectorated)     Status: None   Collection Time: 12/24/19  4:00 PM   Specimen: Tracheal Aspirate; Respiratory  Result Value Ref Range Status   Specimen Description TRACHEAL ASPIRATE  Final   Special Requests NONE  Final   Gram Stain   Final    ABUNDANT WBC PRESENT, PREDOMINANTLY PMN ABUNDANT GRAM POSITIVE RODS FEW GRAM NEGATIVE RODS Performed at [facility], [place] [address] [place], [place] [address]    Culture MODERATE PROTEUS MIRABILIS  Final   Report Status 12/26/2019 FINAL  Final   Organism ID, Bacteria PROTEUS MIRABILIS  Final      Susceptibility   Proteus mirabilis - MIC*    AMPICILLIN <=2 SENSITIVE Sensitive     CEFAZOLIN 8 SENSITIVE Sensitive     CEFEPIME <=0.12 SENSITIVE Sensitive     CEFTAZIDIME <=1 SENSITIVE Sensitive  CEFTRIAXONE <=0.25 SENSITIVE Sensitive     CIPROFLOXACIN <=0.25 SENSITIVE Sensitive     GENTAMICIN <=1 SENSITIVE Sensitive     IMIPENEM 4 SENSITIVE Sensitive     TRIMETH/SULFA <=20 SENSITIVE Sensitive     AMPICILLIN/SULBACTAM <=2 SENSITIVE Sensitive     PIP/TAZO <=4 SENSITIVE Sensitive     * MODERATE PROTEUS MIRABILIS  Culture, blood (routine x 2)     Status: None   Collection Time: 12/25/19  5:16 PM   Specimen: BLOOD  Result Value Ref Range Status   Specimen Description BLOOD RIGHT ANTECUBITAL  Final   Special Requests   Final    BOTTLES DRAWN AEROBIC AND ANAEROBIC Blood Culture adequate volume   Culture   Final    NO GROWTH 5 DAYS Performed at [facility], [place]  [address] [place], [place] [address]    Report Status 12/30/2019 FINAL  Final  Culture, blood (routine x 2)     Status: None   Collection Time: 12/25/19  5:18 PM   Specimen: BLOOD  Result Value Ref Range Status   Specimen Description BLOOD RIGHT ANTECUBITAL  Final   Special Requests   Final    BOTTLES DRAWN AEROBIC AND ANAEROBIC Blood Culture adequate volume   Culture   Final    NO GROWTH 5 DAYS Performed at [facility], [place] [address] [place], [place] [address]    Report Status 12/30/2019 FINAL  Final  C difficile quick scan w PCR reflex     Status: Abnormal   Collection Time: 12/26/19 12:11 PM   Specimen: STOOL  Result Value Ref Range Status   C Diff antigen POSITIVE (A) NEGATIVE Final   C Diff toxin POSITIVE (A) NEGATIVE Final    Comment: CRITICAL RESULT CALLED TO, READ BACK BY AND VERIFIED WITH:  RN CHRISSY R. @1817  12/26/2019 AKT    C Diff interpretation Toxin producing C. difficile detected.  Final  Culture, blood (routine x 2)     Status: None   Collection Time: 01/05/20 12:43 PM   Specimen: BLOOD  Result Value Ref Range Status   Specimen Description BLOOD RIGHT ANTECUBITAL  Final   Special Requests   Final    BOTTLES DRAWN AEROBIC AND ANAEROBIC Blood Culture adequate volume   Culture   Final    NO GROWTH 5 DAYS Performed at [facility], [address] [place], [place] [address]    Report Status 01/10/2020 FINAL  Final  Culture, respiratory (non-expectorated)     Status: None   Collection Time: 01/05/20  2:55 PM   Specimen: Tracheal Aspirate; Respiratory  Result Value Ref Range Status   Specimen Description TRACHEAL ASPIRATE  Final   Special Requests NONE  Final   Gram Stain   Final    ABUNDANT WBC PRESENT,BOTH PMN AND MONONUCLEAR ABUNDANT GRAM NEGATIVE RODS Performed at [facility], [place] [address] [place], [place] [address]    Culture   Final    ABUNDANT KLEBSIELLA PNEUMONIAE MODERATE PROTEUS MIRABILIS KLEBSIELLA PNEUMONIAE CONFIRMED  CARBAPENEMASE RESISTANT ENTEROBACTERIACAE KLEBSIELLA PNEUMONIAE Confirmed Extended Spectrum Beta-Lactamase Producer (ESBL).  In bloodstream infections from ESBL organisms, carbapenems are preferred over piperacillin/tazobactam. They are shown to have a lower risk of mortality. KLEBSIELLA PNEUMONIAE MULTI-DRUG RESISTANT ORGANISM    Report Status 01/11/2020 FINAL  Final   Organism ID, Bacteria PROTEUS MIRABILIS  Final   Organism ID, Bacteria KLEBSIELLA PNEUMONIAE  Final      Susceptibility   Klebsiella pneumoniae - MIC*  AMPICILLIN >=32 RESISTANT Resistant     CEFAZOLIN >=64 RESISTANT Resistant     CEFEPIME >=32 RESISTANT Resistant     CEFTAZIDIME >=64 RESISTANT Resistant     CEFTRIAXONE >=64 RESISTANT Resistant     CIPROFLOXACIN <=0.25 SENSITIVE Sensitive     GENTAMICIN >=16 RESISTANT Resistant     IMIPENEM >=16 RESISTANT Resistant     TRIMETH/SULFA >=320 RESISTANT Resistant     AMPICILLIN/SULBACTAM >=32 RESISTANT Resistant     PIP/TAZO >=128 RESISTANT Resistant     * ABUNDANT KLEBSIELLA PNEUMONIAE   Proteus mirabilis - MIC*    AMPICILLIN <=2 SENSITIVE Sensitive     CEFAZOLIN <=4 SENSITIVE Sensitive     CEFEPIME <=0.12 SENSITIVE Sensitive     CEFTAZIDIME <=1 SENSITIVE Sensitive     CEFTRIAXONE <=0.25 SENSITIVE Sensitive     CIPROFLOXACIN <=0.25 SENSITIVE Sensitive     GENTAMICIN <=1 SENSITIVE Sensitive     IMIPENEM 8 INTERMEDIATE Intermediate     TRIMETH/SULFA <=20 SENSITIVE Sensitive     AMPICILLIN/SULBACTAM <=2 SENSITIVE Sensitive     PIP/TAZO <=4 SENSITIVE Sensitive     * MODERATE PROTEUS MIRABILIS  Culture, blood (routine x 2)     Status: None   Collection Time: 01/05/20  3:04 PM   Specimen: BLOOD  Result Value Ref Range Status   Specimen Description BLOOD RIGHT ANTECUBITAL  Final   Special Requests   Final    BOTTLES DRAWN AEROBIC ONLY Blood Culture adequate volume   Culture   Final    NO GROWTH 5 DAYS Performed at [facility], [address], [place],  [place] [address]    Report Status 01/10/2020 FINAL  Final  Carbapenem Resistance Panel     Status: Abnormal   Collection Time: 01/05/20  3:08 PM  Result Value Ref Range Status   Carba Resistance IMP Gene NOT DETECTED NOT DETECTED Final   Carba Resistance VIM Gene NOT DETECTED NOT DETECTED Final   Carba Resistance NDM Gene NOT DETECTED NOT DETECTED Final   Carba Resistance KPC Gene DETECTED (A) NOT DETECTED Final    Comment: CRITICAL RESULT CALLED TO, READ BACK BY AND VERIFIED WITH: RBV RN D. Fara Olden [phone] FCP    Carba Resistance OXA48 Gene NOT DETECTED NOT DETECTED Final    Comment: (NOTE) Cepheid Carba-R is an FDA-cleared nucleic acid amplification test  (NAAT)for the detection and differentiation of genes encoding the  most prevalent carbapenemases in bacterial isolate samples. Carbapenemase gene identification and implementation of comprehensive  infection control measures are recommended by the CDC to prevent the  spread of the resistant organisms. Performed at [facility], [place] [address], [place], [place] [address]     Coagulation Studies: No results for input(s): LABPROT, INR in the last 72 hours.  Urinalysis: No results for input(s): COLORURINE, LABSPEC, PHURINE, GLUCOSEU, HGBUR, BILIRUBINUR, KETONESUR, PROTEINUR, UROBILINOGEN, NITRITE, LEUKOCYTESUR in the last 72 hours.  Invalid input(s): APPERANCEUR    Imaging: No results found.   Medications:     iohexol  Assessment/ Plan:  69 y.o. male with a PMHx of ESRD on HD, CHF, aortic stenosis, hypertension, diabetes mellitus type 2, hyperlipidemia, obstructive sleep apnea, chronic venous stasis with LE edema, and obesity who was admitted to [facility] on 12/16/2019 for ongoing treatment of respiratory failure, ESRD, malnutrition, and generalized debility.    1.  ESRD on HD MWF.    Patient due for hemodialysis treatment and remains on MWF schedule.  We plan to complete treatment today and thereafter next treatment  will be on Monday.  2.  Acute respiratory failure.    Patient remains vent dependent at the moment.  Weaning protocol per respiratory therapy and pulmonary/critical care.  3.  Secondary hyperparathyroidism.    Phosphorus control has been good overall.  Phosphorus 4.3 today.  4.  Anemia of chronic kidney disease.  Hemoglobin improved up to 9.6.  Maintain the patient on Retacrit 4000 units IV with dialysis.    LOS: 0 Shawntrice Salle 4/30/20218:23 AM

## 2020-02-04 NOTE — Progress Notes (Signed)
Pulmonary Critical Care Medicine [place]   PULMONARY CRITICAL CARE SERVICE  PROGRESS NOTE  Date of Service: 02/04/2020  Jose Tucker  VZD:[id number]  DOB: July 08, 1951   DOA: 12/30/2019  Referring Physician: Merton Border, MD  HPI: Jose Tucker is a 69 y.o. male seen for follow up of Acute on Chronic Respiratory Failure.  Patient currently is on pressure support on 28% FiO2 has been on pressure support of 12/5  Medications: Reviewed on Rounds  Physical Exam:  Vitals: Temperature 96.0 pulse 69 respiratory 14 blood pressure is 121/57 saturations 96%  Ventilator Settings on pressure support FiO2 28%  . General: Comfortable at this time . Eyes: Grossly normal lids, irises & conjunctiva . ENT: grossly tongue is normal . Neck: no obvious mass . Cardiovascular: S1 S2 normal no gallop . Respiratory: No rhonchi coarse breath sounds . Abdomen: soft . Skin: no rash seen on limited exam . Musculoskeletal: not rigid . Psychiatric:unable to assess . Neurologic: no seizure no involuntary movements         Lab Data:   Basic Metabolic Panel: Recent Labs  Lab 01/31/20 0714 02/02/20 0802 02/04/20 0554  NA 133* 133* 136  K 4.3 4.2 4.5  CL 94* 95* 99  CO2 28 26 24   GLUCOSE 187* 132* 138*  BUN 76* 68* 65*  CREATININE 3.40* 3.33* 3.31*  CALCIUM 9.1 9.2 9.1  PHOS 3.4 4.2 4.3    ABG: No results for input(s): PHART, PCO2ART, PO2ART, HCO3, O2SAT in the last 168 hours.  Liver Function Tests: Recent Labs  Lab 01/31/20 0714 02/02/20 0802 02/04/20 0554  ALBUMIN 2.8* 3.0* 3.0*   No results for input(s): LIPASE, AMYLASE in the last 168 hours. No results for input(s): AMMONIA in the last 168 hours.  CBC: Recent Labs  Lab 01/31/20 0714 02/02/20 0802 02/04/20 0554  WBC 11.6* 12.8* 12.5*  HGB 8.6* 8.4* 9.6*  HCT 29.4* 28.6* 32.2*  MCV 102.8* 103.6* 103.5*  PLT 72* 85* 65*    Cardiac Enzymes: No results for input(s): CKTOTAL, CKMB, CKMBINDEX, TROPONINI  in the last 168 hours.  BNP (last 3 results) No results for input(s): BNP in the last 8760 hours.  ProBNP (last 3 results) No results for input(s): PROBNP in the last 8760 hours.  Radiological Exams: No results found.  Assessment/Plan Active Problems:   Acute on chronic respiratory failure with hypoxia (HCC)   Chronic systolic (congestive) heart failure (HCC)   End stage renal disease on dialysis [facility])   Chronic atrial fibrillation (HCC)   Bilateral pleural effusion   1. Acute on chronic respiratory failure hypoxia continue with pressure support weaning as tolerated patient is on 28% FiO2 2. Chronic systolic heart failure right now is compensated we will continue to follow 3. End-stage renal disease on hemodialysis 4. Chronic atrial fibrillation rate controlled 5. Bilateral pleural effusions no change we will continue to follow along   I have personally seen and evaluated the patient, evaluated laboratory and imaging results, formulated the assessment and plan and placed orders. The Patient requires high complexity decision making with multiple systems involvement.  Rounds were done with the Respiratory Therapy Director and Staff therapists and discussed with nursing staff also.  Allyne Gee, MD [facility] Pulmonary Critical Care Medicine Sleep Medicine

## 2020-02-05 DIAGNOSIS — J9621 Acute and chronic respiratory failure with hypoxia: Secondary | ICD-10-CM | POA: Diagnosis not present

## 2020-02-05 DIAGNOSIS — J90 Pleural effusion, not elsewhere classified: Secondary | ICD-10-CM | POA: Diagnosis not present

## 2020-02-05 DIAGNOSIS — I482 Chronic atrial fibrillation, unspecified: Secondary | ICD-10-CM | POA: Diagnosis not present

## 2020-02-05 DIAGNOSIS — I5022 Chronic systolic (congestive) heart failure: Secondary | ICD-10-CM | POA: Diagnosis not present

## 2020-02-05 NOTE — Progress Notes (Addendum)
Pulmonary [place]   PULMONARY CRITICAL CARE SERVICE  PROGRESS NOTE  Date of Service: 02/05/2020  Jose Tucker  [id number]:[id number]  DOB: 07/07/1951   DOA: 12/30/2019  Referring Physician: Merton Border, MD  HPI: Jose Tucker is a 69 y.o. male seen for follow up of Acute on Chronic Respiratory Failure.  Patient is pressure support was attempted on T collar again did not tolerate  Medications: Reviewed on Rounds  Physical Exam:  Vitals: Temperature 96.7 pulse 85 respiratory rate 20 blood pressure is 114/60 saturations 92%  Ventilator Settings on pressure support FiO2 28% pressure support 12/5  . General: Comfortable at this time . Eyes: Grossly normal lids, irises & conjunctiva . ENT: grossly tongue is normal . Neck: no obvious mass . Cardiovascular: S1 S2 normal no gallop . Respiratory: No rhonchi coarse breath sounds . Abdomen: soft . Skin: no rash seen on limited exam . Musculoskeletal: not rigid . Psychiatric:unable to assess . Neurologic: no seizure no involuntary movements         Lab Data:   Basic Metabolic Panel: Recent Labs  Lab 01/31/20 0714 02/02/20 0802 02/04/20 0554  NA 133* 133* 136  K 4.3 4.2 4.5  CL 94* 95* 99  CO2 28 26 24   GLUCOSE 187* 132* 138*  BUN 76* 68* 65*  CREATININE 3.40* 3.33* 3.31*  CALCIUM 9.1 9.2 9.1  PHOS 3.4 4.2 4.3    ABG: No results for input(s): PHART, PCO2ART, PO2ART, HCO3, O2SAT in the last 168 hours.  Liver Function Tests: Recent Labs  Lab 01/31/20 0714 02/02/20 0802 02/04/20 0554  ALBUMIN 2.8* 3.0* 3.0*   No results for input(s): LIPASE, AMYLASE in the last 168 hours. No results for input(s): AMMONIA in the last 168 hours.  CBC: Recent Labs  Lab 01/31/20 0714 02/02/20 0802 02/04/20 0554  WBC 11.6* 12.8* 12.5*  HGB 8.6* 8.4* 9.6*  HCT 29.4* 28.6* 32.2*  MCV 102.8* 103.6* 103.5*  PLT 72* 85* 65*    Cardiac Enzymes: No results for input(s): CKTOTAL, CKMB,  CKMBINDEX, TROPONINI in the last 168 hours.  BNP (last 3 results) No results for input(s): BNP in the last 8760 hours.  ProBNP (last 3 results) No results for input(s): PROBNP in the last 8760 hours.  Radiological Exams: No results found.  Assessment/Plan Active Problems:   Acute on chronic respiratory failure with hypoxia (HCC)   Chronic systolic (congestive) heart failure (HCC)   End stage renal disease on dialysis [facility])   Chronic atrial fibrillation (HCC)   Bilateral pleural effusion   1. Acute on chronic respiratory failure hypoxia we will continue with pressure support titrate oxygen continue pulmonary toilet patient was attempted on T collar did not do well 2. Chronic systolic heart failure optimize fluid status we will continue with supportive care 3. End-stage renal disease on hemodialysis being followed by nephrology 4. Chronic atrial fibrillation rate controlled 5. Bilateral effusions continue to monitor   I have personally seen and evaluated the patient, evaluated laboratory and imaging results, formulated the assessment and plan and placed orders. The Patient requires high complexity decision making with multiple systems involvement.  Rounds were done with the Respiratory Therapy Director and Staff therapists and discussed with nursing staff also.  Allyne Gee, MD [facility] Pulmonary Critical Care Medicine Sleep Medicine

## 2020-02-06 DIAGNOSIS — I5022 Chronic systolic (congestive) heart failure: Secondary | ICD-10-CM | POA: Diagnosis not present

## 2020-02-06 DIAGNOSIS — J90 Pleural effusion, not elsewhere classified: Secondary | ICD-10-CM | POA: Diagnosis not present

## 2020-02-06 DIAGNOSIS — J9621 Acute and chronic respiratory failure with hypoxia: Secondary | ICD-10-CM | POA: Diagnosis not present

## 2020-02-06 DIAGNOSIS — I482 Chronic atrial fibrillation, unspecified: Secondary | ICD-10-CM | POA: Diagnosis not present

## 2020-02-06 NOTE — Progress Notes (Signed)
Pulmonary Critical Care Medicine [place]   PULMONARY CRITICAL CARE SERVICE  PROGRESS NOTE  Date of Service: 02/06/2020  Tyreese Thain  JOA:[id number]  DOB: Jun 29, 1951   DOA: 12/11/2019  Referring Physician: Merton Border, MD  HPI: Liberty Stead is a 69 y.o. male seen for follow up of Acute on Chronic Respiratory Failure.  Patient is weaning on pressure support today not been tolerating on T collar  Medications: Reviewed on Rounds  Physical Exam:  Vitals: Temperature is 97.2 pulse 85 respiratory 24 blood pressure is 109/65 saturations 96%  Ventilator Settings on pressure support FiO2 28% pressure poor 12 PEEP 5  . General: Comfortable at this time . Eyes: Grossly normal lids, irises & conjunctiva . ENT: grossly tongue is normal . Neck: no obvious mass . Cardiovascular: S1 S2 normal no gallop . Respiratory: No rhonchi no rales are noted at this time . Abdomen: soft . Skin: no rash seen on limited exam . Musculoskeletal: not rigid . Psychiatric:unable to assess . Neurologic: no seizure no involuntary movements         Lab Data:   Basic Metabolic Panel: Recent Labs  Lab 01/31/20 0714 02/02/20 0802 02/04/20 0554  NA 133* 133* 136  K 4.3 4.2 4.5  CL 94* 95* 99  CO2 28 26 24   GLUCOSE 187* 132* 138*  BUN 76* 68* 65*  CREATININE 3.40* 3.33* 3.31*  CALCIUM 9.1 9.2 9.1  PHOS 3.4 4.2 4.3    ABG: No results for input(s): PHART, PCO2ART, PO2ART, HCO3, O2SAT in the last 168 hours.  Liver Function Tests: Recent Labs  Lab 01/31/20 0714 02/02/20 0802 02/04/20 0554  ALBUMIN 2.8* 3.0* 3.0*   No results for input(s): LIPASE, AMYLASE in the last 168 hours. No results for input(s): AMMONIA in the last 168 hours.  CBC: Recent Labs  Lab 01/31/20 0714 02/02/20 0802 02/04/20 0554  WBC 11.6* 12.8* 12.5*  HGB 8.6* 8.4* 9.6*  HCT 29.4* 28.6* 32.2*  MCV 102.8* 103.6* 103.5*  PLT 72* 85* 65*    Cardiac Enzymes: No results for input(s): CKTOTAL,  CKMB, CKMBINDEX, TROPONINI in the last 168 hours.  BNP (last 3 results) No results for input(s): BNP in the last 8760 hours.  ProBNP (last 3 results) No results for input(s): PROBNP in the last 8760 hours.  Radiological Exams: No results found.  Assessment/Plan Active Problems:   Acute on chronic respiratory failure with hypoxia (HCC)   Chronic systolic (congestive) heart failure (HCC)   End stage renal disease on dialysis [facility])   Chronic atrial fibrillation (HCC)   Bilateral pleural effusion   1. Acute on chronic respiratory failure hypoxia we will continue with pressure support titrate oxygen continue pulmonary toilet. 2. Chronic systolic heart failure at baseline we will continue to follow 3. End-stage renal disease on hemodialysis continue present management 4. Chronic atrial fibrillation rate controlled 5. Bilateral effusions following fluid status   I have personally seen and evaluated the patient, evaluated laboratory and imaging results, formulated the assessment and plan and placed orders. The Patient requires high complexity decision making with multiple systems involvement.  Rounds were done with the Respiratory Therapy Director and Staff therapists and discussed with nursing staff also.  Allyne Gee, MD [facility] Pulmonary Critical Care Medicine Sleep Medicine

## 2020-02-07 DIAGNOSIS — J9621 Acute and chronic respiratory failure with hypoxia: Secondary | ICD-10-CM | POA: Diagnosis not present

## 2020-02-07 DIAGNOSIS — J90 Pleural effusion, not elsewhere classified: Secondary | ICD-10-CM | POA: Diagnosis not present

## 2020-02-07 DIAGNOSIS — I482 Chronic atrial fibrillation, unspecified: Secondary | ICD-10-CM | POA: Diagnosis not present

## 2020-02-07 DIAGNOSIS — I5022 Chronic systolic (congestive) heart failure: Secondary | ICD-10-CM | POA: Diagnosis not present

## 2020-02-07 LAB — CBC
HCT: 31.2 % — ABNORMAL LOW (ref 39.0–52.0)
Hemoglobin: 9 g/dL — ABNORMAL LOW (ref 13.0–17.0)
MCH: 30.3 pg (ref 26.0–34.0)
MCHC: 28.8 g/dL — ABNORMAL LOW (ref 30.0–36.0)
MCV: 105.1 fL — ABNORMAL HIGH (ref 80.0–100.0)
Platelets: 64 10*3/uL — ABNORMAL LOW (ref 150–400)
RBC: 2.97 MIL/uL — ABNORMAL LOW (ref 4.22–5.81)
RDW: 20.4 % — ABNORMAL HIGH (ref 11.5–15.5)
WBC: 13.1 10*3/uL — ABNORMAL HIGH (ref 4.0–10.5)
nRBC: 0.2 % (ref 0.0–0.2)

## 2020-02-07 LAB — RENAL FUNCTION PANEL
Albumin: 2.8 g/dL — ABNORMAL LOW (ref 3.5–5.0)
Anion gap: 13 (ref 5–15)
BUN: 76 mg/dL — ABNORMAL HIGH (ref 8–23)
CO2: 22 mmol/L (ref 22–32)
Calcium: 9 mg/dL (ref 8.9–10.3)
Chloride: 99 mmol/L (ref 98–111)
Creatinine, Ser: 3.58 mg/dL — ABNORMAL HIGH (ref 0.61–1.24)
GFR calc Af Amer: 19 mL/min — ABNORMAL LOW (ref >60)
GFR calc non Af Amer: 16 mL/min — ABNORMAL LOW (ref >60)
Glucose, Bld: 160 mg/dL — ABNORMAL HIGH (ref 70–99)
Phosphorus: 4.3 mg/dL (ref 2.5–4.6)
Potassium: 5.6 mmol/L — ABNORMAL HIGH (ref 3.5–5.1)
Sodium: 134 mmol/L — ABNORMAL LOW (ref 135–145)

## 2020-02-07 NOTE — Progress Notes (Signed)
[facility]  ROUNDING NOTE   Subjective:  Patient due for hemodialysis treatment today. Resting comfortably in bed. Still on the ventilator at this time.  Objective:  Vital signs in last 24 hours:  Temperature 98.7 pulse 74 respiration 16 blood pressure 118/55  Physical Exam: General: Critically ill-appearing  Head: Normocephalic, atraumatic. Moist oral mucosal membranes  Eyes: Anicteric  Neck: Tracheostomy in place  Lungs:  Scattered rhonchi bilateral, vent assisted  Heart: S1S2 irregular  Abdomen:  Soft, nontender, bowel sounds present  Extremities: Trace peripheral edema.  Neurologic: Awake, alert, following commands  Skin: Bilateral upper extremity ecchymoses  Access: Right IJ PermCath    Basic Metabolic Panel: Recent Labs  Lab 02/02/20 0802 02/04/20 0554 02/07/20 0703  NA 133* 136 134*  K 4.2 4.5 5.6*  CL 95* 99 99  CO2 26 24 22   GLUCOSE 132* 138* 160*  BUN 68* 65* 76*  CREATININE 3.33* 3.31* 3.58*  CALCIUM 9.2 9.1 9.0  PHOS 4.2 4.3 4.3    Liver Function Tests: Recent Labs  Lab 02/02/20 0802 02/04/20 0554 02/07/20 0703  ALBUMIN 3.0* 3.0* 2.8*   No results for input(s): LIPASE, AMYLASE in the last 168 hours. No results for input(s): AMMONIA in the last 168 hours.  CBC: Recent Labs  Lab 02/02/20 0802 02/04/20 0554  WBC 12.8* 12.5*  HGB 8.4* 9.6*  HCT 28.6* 32.2*  MCV 103.6* 103.5*  PLT 85* 65*    Cardiac Enzymes: No results for input(s): CKTOTAL, CKMB, CKMBINDEX, TROPONINI in the last 168 hours.  BNP: Invalid input(s): POCBNP  CBG: No results for input(s): GLUCAP in the last 168 hours.  Microbiology: Results for orders placed or performed during the hospital encounter of 12/26/2019  C difficile quick scan w PCR reflex     Status: None   Collection Time: 12/18/19  4:49 AM   Specimen: STOOL  Result Value Ref Range Status   C Diff antigen NEGATIVE NEGATIVE Final   C Diff toxin NEGATIVE NEGATIVE Final   C Diff  interpretation No C. difficile detected.  Corrected    Comment: Performed at [facility], [place] [address] [place], [place] [address] CORRECTED ON 03/13 AT 1510: PREVIOUSLY REPORTED AS VALID   Culture, respiratory (non-expectorated)     Status: None   Collection Time: 12/24/19  4:00 PM   Specimen: Tracheal Aspirate; Respiratory  Result Value Ref Range Status   Specimen Description TRACHEAL ASPIRATE  Final   Special Requests NONE  Final   Gram Stain   Final    ABUNDANT WBC PRESENT, PREDOMINANTLY PMN ABUNDANT GRAM POSITIVE RODS FEW GRAM NEGATIVE RODS Performed at [facility], [place] [address] [place], [place] [address]    Culture MODERATE PROTEUS MIRABILIS  Final   Report Status 12/26/2019 FINAL  Final   Organism ID, Bacteria PROTEUS MIRABILIS  Final      Susceptibility   Proteus mirabilis - MIC*    AMPICILLIN <=2 SENSITIVE Sensitive     CEFAZOLIN 8 SENSITIVE Sensitive     CEFEPIME <=0.12 SENSITIVE Sensitive     CEFTAZIDIME <=1 SENSITIVE Sensitive     CEFTRIAXONE <=0.25 SENSITIVE Sensitive     CIPROFLOXACIN <=0.25 SENSITIVE Sensitive     GENTAMICIN <=1 SENSITIVE Sensitive     IMIPENEM 4 SENSITIVE Sensitive     TRIMETH/SULFA <=20 SENSITIVE Sensitive     AMPICILLIN/SULBACTAM <=2 SENSITIVE Sensitive     PIP/TAZO <=4 SENSITIVE Sensitive     * MODERATE PROTEUS MIRABILIS  Culture, blood (routine x  2)     Status: None   Collection Time: 12/25/19  5:16 PM   Specimen: BLOOD  Result Value Ref Range Status   Specimen Description BLOOD RIGHT ANTECUBITAL  Final   Special Requests   Final    BOTTLES DRAWN AEROBIC AND ANAEROBIC Blood Culture adequate volume   Culture   Final    NO GROWTH 5 DAYS Performed at [facility], [address] [place], [place] [address]    Report Status 12/30/2019 FINAL  Final  Culture, blood (routine x 2)     Status: None   Collection Time: 12/25/19  5:18 PM   Specimen: BLOOD  Result Value Ref Range Status   Specimen Description BLOOD  RIGHT ANTECUBITAL  Final   Special Requests   Final    BOTTLES DRAWN AEROBIC AND ANAEROBIC Blood Culture adequate volume   Culture   Final    NO GROWTH 5 DAYS Performed at [facility], [place] [address] [place], [place] [address]    Report Status 12/30/2019 FINAL  Final  C difficile quick scan w PCR reflex     Status: Abnormal   Collection Time: 12/26/19 12:11 PM   Specimen: STOOL  Result Value Ref Range Status   C Diff antigen POSITIVE (A) NEGATIVE Final   C Diff toxin POSITIVE (A) NEGATIVE Final    Comment: CRITICAL RESULT CALLED TO, READ BACK BY AND VERIFIED WITH:  RN CHRISSY R. @1817  12/26/2019 AKT    C Diff interpretation Toxin producing C. difficile detected.  Final  Culture, blood (routine x 2)     Status: None   Collection Time: 01/05/20 12:43 PM   Specimen: BLOOD  Result Value Ref Range Status   Specimen Description BLOOD RIGHT ANTECUBITAL  Final   Special Requests   Final    BOTTLES DRAWN AEROBIC AND ANAEROBIC Blood Culture adequate volume   Culture   Final    NO GROWTH 5 DAYS Performed at [facility], [address] [place], [place] [address]    Report Status 01/10/2020 FINAL  Final  Culture, respiratory (non-expectorated)     Status: None   Collection Time: 01/05/20  2:55 PM   Specimen: Tracheal Aspirate; Respiratory  Result Value Ref Range Status   Specimen Description TRACHEAL ASPIRATE  Final   Special Requests NONE  Final   Gram Stain   Final    ABUNDANT WBC PRESENT,BOTH PMN AND MONONUCLEAR ABUNDANT GRAM NEGATIVE RODS Performed at [facility], [place] [address], [place], [place] [address]    Culture   Final    ABUNDANT KLEBSIELLA PNEUMONIAE MODERATE PROTEUS MIRABILIS KLEBSIELLA PNEUMONIAE CONFIRMED CARBAPENEMASE RESISTANT ENTEROBACTERIACAE KLEBSIELLA PNEUMONIAE Confirmed Extended Spectrum Beta-Lactamase Producer (ESBL).  In bloodstream infections from ESBL organisms, carbapenems are preferred over piperacillin/tazobactam. They are shown to  have a lower risk of mortality. KLEBSIELLA PNEUMONIAE MULTI-DRUG RESISTANT ORGANISM    Report Status 01/11/2020 FINAL  Final   Organism ID, Bacteria PROTEUS MIRABILIS  Final   Organism ID, Bacteria KLEBSIELLA PNEUMONIAE  Final      Susceptibility   Klebsiella pneumoniae - MIC*    AMPICILLIN >=32 RESISTANT Resistant     CEFAZOLIN >=64 RESISTANT Resistant     CEFEPIME >=32 RESISTANT Resistant     CEFTAZIDIME >=64 RESISTANT Resistant     CEFTRIAXONE >=64 RESISTANT Resistant     CIPROFLOXACIN <=0.25 SENSITIVE Sensitive     GENTAMICIN >=16 RESISTANT Resistant     IMIPENEM >=16 RESISTANT Resistant     TRIMETH/SULFA >=  320 RESISTANT Resistant     AMPICILLIN/SULBACTAM >=32 RESISTANT Resistant     PIP/TAZO >=128 RESISTANT Resistant     * ABUNDANT KLEBSIELLA PNEUMONIAE   Proteus mirabilis - MIC*    AMPICILLIN <=2 SENSITIVE Sensitive     CEFAZOLIN <=4 SENSITIVE Sensitive     CEFEPIME <=0.12 SENSITIVE Sensitive     CEFTAZIDIME <=1 SENSITIVE Sensitive     CEFTRIAXONE <=0.25 SENSITIVE Sensitive     CIPROFLOXACIN <=0.25 SENSITIVE Sensitive     GENTAMICIN <=1 SENSITIVE Sensitive     IMIPENEM 8 INTERMEDIATE Intermediate     TRIMETH/SULFA <=20 SENSITIVE Sensitive     AMPICILLIN/SULBACTAM <=2 SENSITIVE Sensitive     PIP/TAZO <=4 SENSITIVE Sensitive     * MODERATE PROTEUS MIRABILIS  Culture, blood (routine x 2)     Status: None   Collection Time: 01/05/20  3:04 PM   Specimen: BLOOD  Result Value Ref Range Status   Specimen Description BLOOD RIGHT ANTECUBITAL  Final   Special Requests   Final    BOTTLES DRAWN AEROBIC ONLY Blood Culture adequate volume   Culture   Final    NO GROWTH 5 DAYS Performed at [facility], [place] [address], [place], [place] [address]    Report Status 01/10/2020 FINAL  Final  Carbapenem Resistance Panel     Status: Abnormal   Collection Time: 01/05/20  3:08 PM  Result Value Ref Range Status   Carba Resistance IMP Gene NOT DETECTED NOT DETECTED Final   Carba  Resistance VIM Gene NOT DETECTED NOT DETECTED Final   Carba Resistance NDM Gene NOT DETECTED NOT DETECTED Final   Carba Resistance KPC Gene DETECTED (A) NOT DETECTED Final    Comment: CRITICAL RESULT CALLED TO, READ BACK BY AND VERIFIED WITH: RBV RN D. Fara Olden [phone] FCP    Carba Resistance OXA48 Gene NOT DETECTED NOT DETECTED Final    Comment: (NOTE) Cepheid Carba-R is an FDA-cleared nucleic acid amplification test  (NAAT)for the detection and differentiation of genes encoding the  most prevalent carbapenemases in bacterial isolate samples. Carbapenemase gene identification and implementation of comprehensive  infection control measures are recommended by the CDC to prevent the  spread of the resistant organisms. Performed at [facility], [place] [address], [place], [place] [address]     Coagulation Studies: No results for input(s): LABPROT, INR in the last 72 hours.  Urinalysis: No results for input(s): COLORURINE, LABSPEC, PHURINE, GLUCOSEU, HGBUR, BILIRUBINUR, KETONESUR, PROTEINUR, UROBILINOGEN, NITRITE, LEUKOCYTESUR in the last 72 hours.  Invalid input(s): APPERANCEUR    Imaging: No results found.   Medications:     iohexol  Assessment/ Plan:  69 y.o. male with a PMHx of ESRD on HD, CHF, aortic stenosis, hypertension, diabetes mellitus type 2, hyperlipidemia, obstructive sleep apnea, chronic venous stasis with LE edema, and obesity who was admitted to [facility] on 01/03/2020 for ongoing treatment of respiratory failure, ESRD, malnutrition, and generalized debility.    1.  ESRD on HD MWF.    We will maintain patient on MWF dialysis treatment schedule.  Therefore due for dialysis treatment today.  2.  Acute respiratory failure.    Patient remains on the ventilator this AM.  Continue current ventilatory support.  3.  Secondary hyperparathyroidism.    Phosphorus remains stable at 4.3.  Continue to monitor.  4.  Anemia of chronic kidney disease.  Hemoglobin was  9.6 at last check.  Continue Retacrit 4000 units IV with dialysis.    LOS: 0 Matelyn Antonelli 5/3/20218:20  AM

## 2020-02-07 NOTE — Progress Notes (Signed)
Pulmonary [name]   PULMONARY CRITICAL CARE SERVICE  PROGRESS NOTE  Date of Service: 02/07/2020  Jose Tucker  XKG:[id number]  DOB: 24-Jun-1951   DOA: 12/14/2019  Referring Physician: Merton Border, MD  HPI: Jose Tucker is a 69 y.o. male seen for follow up of Acute on Chronic Respiratory Failure.  Patient currently is on pressure support has been on 28% FiO2.  Patient has been consistently failing attempts at weaning  Medications: Reviewed on Rounds  Physical Exam:  Vitals: Temperature is 98.7 pulse 74 respiratory rate 16 blood pressure is 118/55 saturations 96%  Ventilator Settings on pressure support FiO2 is 28% pressure poor 12 PEEP 5 tidal volume 619  . General: Comfortable at this time . Eyes: Grossly normal lids, irises & conjunctiva . ENT: grossly tongue is normal . Neck: no obvious mass . Cardiovascular: S1 S2 normal no gallop . Respiratory: Scattered rhonchi coarse breath sounds are noted . Abdomen: soft . Skin: no rash seen on limited exam . Musculoskeletal: not rigid . Psychiatric:unable to assess . Neurologic: no seizure no involuntary movements         Lab Data:   Basic Metabolic Panel: Recent Labs  Lab 02/02/20 0802 02/04/20 0554 02/07/20 0703  NA 133* 136 134*  K 4.2 4.5 5.6*  CL 95* 99 99  CO2 26 24 22   GLUCOSE 132* 138* 160*  BUN 68* 65* 76*  CREATININE 3.33* 3.31* 3.58*  CALCIUM 9.2 9.1 9.0  PHOS 4.2 4.3 4.3    ABG: No results for input(s): PHART, PCO2ART, PO2ART, HCO3, O2SAT in the last 168 hours.  Liver Function Tests: Recent Labs  Lab 02/02/20 0802 02/04/20 0554 02/07/20 0703  ALBUMIN 3.0* 3.0* 2.8*   No results for input(s): LIPASE, AMYLASE in the last 168 hours. No results for input(s): AMMONIA in the last 168 hours.  CBC: Recent Labs  Lab 02/02/20 0802 02/04/20 0554  WBC 12.8* 12.5*  HGB 8.4* 9.6*  HCT 28.6* 32.2*  MCV 103.6* 103.5*  PLT 85* 65*    Cardiac Enzymes: No  results for input(s): CKTOTAL, CKMB, CKMBINDEX, TROPONINI in the last 168 hours.  BNP (last 3 results) No results for input(s): BNP in the last 8760 hours.  ProBNP (last 3 results) No results for input(s): PROBNP in the last 8760 hours.  Radiological Exams: No results found.  Assessment/Plan Active Problems:   Acute on chronic respiratory failure with hypoxia (HCC)   Chronic systolic (congestive) heart failure (HCC)   End stage renal disease on dialysis [facility])   Chronic atrial fibrillation (HCC)   Bilateral pleural effusion   1. Acute on chronic respiratory failure hypoxia we will continue with on full support on assist control mode.  We will continue secretion management pulmonary toilet. 2. Chronic systolic heart failure compensated at this time we will continue to monitor 3. End-stage renal disease on hemodialysis 4. Chronic atrial fibrillation rate is controlled at this time we will continue to follow 5. Bilateral effusions follow-up x-rays   I have personally seen and evaluated the patient, evaluated laboratory and imaging results, formulated the assessment and plan and placed orders. The Patient requires high complexity decision making with multiple systems involvement.  Rounds were done with the Respiratory Therapy Director and Staff therapists and discussed with nursing staff also.  Allyne Gee, MD [facility] Pulmonary Critical Care Medicine Sleep Medicine

## 2020-02-08 ENCOUNTER — Other Ambulatory Visit (HOSPITAL_COMMUNITY): Payer: Medicare Other

## 2020-02-08 DIAGNOSIS — J90 Pleural effusion, not elsewhere classified: Secondary | ICD-10-CM | POA: Diagnosis not present

## 2020-02-08 DIAGNOSIS — I482 Chronic atrial fibrillation, unspecified: Secondary | ICD-10-CM | POA: Diagnosis not present

## 2020-02-08 DIAGNOSIS — I5022 Chronic systolic (congestive) heart failure: Secondary | ICD-10-CM | POA: Diagnosis not present

## 2020-02-08 DIAGNOSIS — J9621 Acute and chronic respiratory failure with hypoxia: Secondary | ICD-10-CM | POA: Diagnosis not present

## 2020-02-08 LAB — BLOOD GAS, ARTERIAL
Acid-Base Excess: 5.8 mmol/L — ABNORMAL HIGH (ref 0.0–2.0)
Acid-Base Excess: 5.4 mmol/L — ABNORMAL HIGH (ref 0.0–2.0)
Bicarbonate: 31 mmol/L — ABNORMAL HIGH (ref 20.0–28.0)
Bicarbonate: 30.1 mmol/L — ABNORMAL HIGH (ref 20.0–28.0)
FIO2: 28
FIO2: 55
O2 Saturation: 86.1 %
O2 Saturation: 99.4 %
Patient temperature: 36.6
Patient temperature: 36.8
pCO2 arterial: 54.7 mmHg — ABNORMAL HIGH (ref 32.0–48.0)
pCO2 arterial: 49.9 mmHg — ABNORMAL HIGH (ref 32.0–48.0)
pH, Arterial: 7.37 (ref 7.350–7.450)
pH, Arterial: 7.397 (ref 7.350–7.450)
pO2, Arterial: 51.1 mmHg — ABNORMAL LOW (ref 83.0–108.0)
pO2, Arterial: 106 mmHg (ref 83.0–108.0)

## 2020-02-08 LAB — POTASSIUM: Potassium: 3.4 mmol/L — ABNORMAL LOW (ref 3.5–5.1)

## 2020-02-08 NOTE — Progress Notes (Signed)
Pulmonary [place]   PULMONARY CRITICAL CARE SERVICE  PROGRESS NOTE  Date of Service: 02/08/2020  Jose Tucker  ELF:[id number]  DOB: 1951/09/14   DOA: 12/16/2019  Referring Physician: Merton Border, MD  HPI: Jose Tucker is a 69 y.o. male seen for follow up of Acute on Chronic Respiratory Failure.  Patient currently is on pressure support mode has been on 45% FiO2 has had some desaturations noted earlier this morning.  I spoke with respiratory therapy asked them to place back on the ventilator to the rest  Medications: Reviewed on Rounds  Physical Exam:  Vitals: Temperature 96.4 pulse of 71 respiratory rate 12 blood pressure is 101/62 saturations 96%  Ventilator Settings on pressure support FiO2 45% pressure support 12 PEEP 5  . General: Comfortable at this time . Eyes: Grossly normal lids, irises & conjunctiva . ENT: grossly tongue is normal . Neck: no obvious mass . Cardiovascular: S1 S2 normal no gallop . Respiratory: No rhonchi coarse breath sounds are noted . Abdomen: soft . Skin: no rash seen on limited exam . Musculoskeletal: not rigid . Psychiatric:unable to assess . Neurologic: no seizure no involuntary movements         Lab Data:   Basic Metabolic Panel: Recent Labs  Lab 02/02/20 0802 02/04/20 0554 02/07/20 0703 02/08/20 0509  NA 133* 136 134*  --   K 4.2 4.5 5.6* 3.4*  CL 95* 99 99  --   CO2 26 24 22   --   GLUCOSE 132* 138* 160*  --   BUN 68* 65* 76*  --   CREATININE 3.33* 3.31* 3.58*  --   CALCIUM 9.2 9.1 9.0  --   PHOS 4.2 4.3 4.3  --     ABG: Recent Labs  Lab 02/08/20 1238  PHART 7.370  PCO2ART 54.7*  PO2ART 51.1*  HCO3 31.0*  O2SAT 86.1    Liver Function Tests: Recent Labs  Lab 02/02/20 0802 02/04/20 0554 02/07/20 0703  ALBUMIN 3.0* 3.0* 2.8*   No results for input(s): LIPASE, AMYLASE in the last 168 hours. No results for input(s): AMMONIA in the last 168 hours.  CBC: Recent Labs   Lab 02/02/20 0802 02/04/20 0554 02/07/20 0930  WBC 12.8* 12.5* 13.1*  HGB 8.4* 9.6* 9.0*  HCT 28.6* 32.2* 31.2*  MCV 103.6* 103.5* 105.1*  PLT 85* 65* 64*    Cardiac Enzymes: No results for input(s): CKTOTAL, CKMB, CKMBINDEX, TROPONINI in the last 168 hours.  BNP (last 3 results) No results for input(s): BNP in the last 8760 hours.  ProBNP (last 3 results) No results for input(s): PROBNP in the last 8760 hours.  Radiological Exams: No results found.  Assessment/Plan Active Problems:   Acute on chronic respiratory failure with hypoxia (HCC)   Chronic systolic (congestive) heart failure (HCC)   End stage renal disease on dialysis [facility])   Chronic atrial fibrillation (HCC)   Bilateral pleural effusion   1. Acute on chronic respiratory failure hypoxia continue with assist control and take off of pressure support titrate oxygen continue pulmonary toilet.  Patient does tolerate the FiO2 45% fairly well 2. Chronic systolic heart failure compensated at this time we will continue present management 3. End-stage renal failure on hemodialysis 4. Chronic atrial fibrillation rate is controlled we will continue to monitor 5. Bilateral pleural effusions no changes we will continue to follow along   I have personally seen and evaluated the patient, evaluated laboratory and imaging results, formulated the assessment and plan and  placed orders. The Patient requires high complexity decision making with multiple systems involvement.  Rounds were done with the Respiratory Therapy Director and Staff therapists and discussed with nursing staff also.  Allyne Gee, MD [facility] Pulmonary Critical Care Medicine Sleep Medicine

## 2020-02-09 DIAGNOSIS — I5022 Chronic systolic (congestive) heart failure: Secondary | ICD-10-CM | POA: Diagnosis not present

## 2020-02-09 DIAGNOSIS — I482 Chronic atrial fibrillation, unspecified: Secondary | ICD-10-CM | POA: Diagnosis not present

## 2020-02-09 DIAGNOSIS — J9621 Acute and chronic respiratory failure with hypoxia: Secondary | ICD-10-CM | POA: Diagnosis not present

## 2020-02-09 DIAGNOSIS — J90 Pleural effusion, not elsewhere classified: Secondary | ICD-10-CM | POA: Diagnosis not present

## 2020-02-09 LAB — CBC
HCT: 30.9 % — ABNORMAL LOW (ref 39.0–52.0)
Hemoglobin: 9 g/dL — ABNORMAL LOW (ref 13.0–17.0)
MCH: 30.6 pg (ref 26.0–34.0)
MCHC: 29.1 g/dL — ABNORMAL LOW (ref 30.0–36.0)
MCV: 105.1 fL — ABNORMAL HIGH (ref 80.0–100.0)
Platelets: 45 10*3/uL — ABNORMAL LOW (ref 150–400)
RBC: 2.94 MIL/uL — ABNORMAL LOW (ref 4.22–5.81)
RDW: 20 % — ABNORMAL HIGH (ref 11.5–15.5)
WBC: 13.4 10*3/uL — ABNORMAL HIGH (ref 4.0–10.5)
nRBC: 0 % (ref 0.0–0.2)

## 2020-02-09 LAB — RENAL FUNCTION PANEL
Albumin: 2.8 g/dL — ABNORMAL LOW (ref 3.5–5.0)
Anion gap: 9 (ref 5–15)
BUN: 66 mg/dL — ABNORMAL HIGH (ref 8–23)
CO2: 31 mmol/L (ref 22–32)
Calcium: 8.8 mg/dL — ABNORMAL LOW (ref 8.9–10.3)
Chloride: 99 mmol/L (ref 98–111)
Creatinine, Ser: 3.37 mg/dL — ABNORMAL HIGH (ref 0.61–1.24)
GFR calc Af Amer: 21 mL/min — ABNORMAL LOW (ref >60)
GFR calc non Af Amer: 18 mL/min — ABNORMAL LOW (ref >60)
Glucose, Bld: 157 mg/dL — ABNORMAL HIGH (ref 70–99)
Phosphorus: 3.2 mg/dL (ref 2.5–4.6)
Potassium: 3.8 mmol/L (ref 3.5–5.1)
Sodium: 139 mmol/L (ref 135–145)

## 2020-02-09 NOTE — Progress Notes (Addendum)
Pulmonary [place]   PULMONARY CRITICAL CARE SERVICE  PROGRESS NOTE  Date of Service: 02/09/2020  Michal Callicott  NTI:[id number]  DOB: Nov 23, 1950   DOA: 12/19/2019  Referring Physician: Merton Border, MD  HPI: Jose Tucker is a 69 y.o. male seen for follow up of Acute on Chronic Respiratory Failure.  Patient has on full support on ventilator style with a rate of 25 and FiO2 of 50% satting well no fever or distress.  Currently on dialysis of no weaning is done.  Medications: Reviewed on Rounds  Physical Exam:  Vitals: Pulse 74 respirations 22 BP 126/59 O2 sat 100% temp 98.6  Ventilator Settings ventilator mode AC VC rate of 25 tidal volume 500 PEEP of 8 with an FiO2 of 50%  . General: Comfortable at this time . Eyes: Grossly normal lids, irises & conjunctiva . ENT: grossly tongue is normal . Neck: no obvious mass . Cardiovascular: S1 S2 normal no gallop . Respiratory: No rales or rhonchi noted . Abdomen: soft . Skin: no rash seen on limited exam . Musculoskeletal: not rigid . Psychiatric:unable to assess . Neurologic: no seizure no involuntary movements         Lab Data:   Basic Metabolic Panel: Recent Labs  Lab 02/04/20 0554 02/07/20 0703 02/08/20 0509 02/09/20 0652  NA 136 134*  --  139  K 4.5 5.6* 3.4* 3.8  CL 99 99  --  99  CO2 24 22  --  31  GLUCOSE 138* 160*  --  157*  BUN 65* 76*  --  66*  CREATININE 3.31* 3.58*  --  3.37*  CALCIUM 9.1 9.0  --  8.8*  PHOS 4.3 4.3  --  3.2    ABG: Recent Labs  Lab 02/08/20 1238 02/08/20 1445  PHART 7.370 7.397  PCO2ART 54.7* 49.9*  PO2ART 51.1* 106  HCO3 31.0* 30.1*  O2SAT 86.1 99.4    Liver Function Tests: Recent Labs  Lab 02/04/20 0554 02/07/20 0703 02/09/20 0652  ALBUMIN 3.0* 2.8* 2.8*   No results for input(s): LIPASE, AMYLASE in the last 168 hours. No results for input(s): AMMONIA in the last 168 hours.  CBC: Recent Labs  Lab 02/04/20 0554 02/07/20 0930  02/09/20 0652  WBC 12.5* 13.1* 13.4*  HGB 9.6* 9.0* 9.0*  HCT 32.2* 31.2* 30.9*  MCV 103.5* 105.1* 105.1*  PLT 65* 64* 45*    Cardiac Enzymes: No results for input(s): CKTOTAL, CKMB, CKMBINDEX, TROPONINI in the last 168 hours.  BNP (last 3 results) No results for input(s): BNP in the last 8760 hours.  ProBNP (last 3 results) No results for input(s): PROBNP in the last 8760 hours.  Radiological Exams: DG CHEST PORT 1 VIEW  Result Date: 02/08/2020 CLINICAL DATA:  Hypoxia. EXAM: PORTABLE CHEST 1 VIEW COMPARISON:  01/12/2020. FINDINGS: Tracheostomy and right dual-lumen catheter in stable position. Left PICC line tip noted over the left brachiocephalic vein. Prominent cardiomegaly again noted. Diffuse bilateral pulmonary infiltrates/edema again noted. Slight improvement from prior exam may be present. Persistent bibasilar atelectasis bilateral pleural effusions again noted, worsened from prior exam. No pneumothorax. IMPRESSION: 1. Tracheostomy tube right dual-lumen central catheter in stable position. Left PICC line noted with tip over left brachiocephalic vein. 2. Prominent cardiomegaly again noted. Diffuse bilateral pulmonary infiltrates/edema again noted. Slight improvement in aeration from prior exam may be present. Persistent bibasilar atelectasis. Bilateral pleural effusions again noted, worsened from prior exam. Electronically Signed   By: [place]   On: 02/08/2020  13:28    Assessment/Plan Active Problems:   Acute on chronic respiratory failure with hypoxia (HCC)   Chronic systolic (congestive) heart failure (HCC)   End stage renal disease on dialysis [facility])   Chronic atrial fibrillation (HCC)   Bilateral pleural effusion   1. Acute on chronic respiratory failure hypoxia patient will continue to attempt weaning however is on hold at this time for dialysis.  Continue with supportive measures and pulmonary toilet. 2. Chronic systolic heart failure compensated at this time we  will continue present management 3. End-stage renal failure on hemodialysis 4. Chronic atrial fibrillation rate is controlled we will continue to monitor 5. Bilateral pleural effusions no changes we will continue to follow along   I have personally seen and evaluated the patient, evaluated laboratory and imaging results, formulated the assessment and plan and placed orders. The Patient requires high complexity decision making with multiple systems involvement.  Rounds were done with the Respiratory Therapy Director and Staff therapists and discussed with nursing staff also.  Allyne Gee, MD [facility] Pulmonary Critical Care Medicine Sleep Medicine

## 2020-02-09 NOTE — Progress Notes (Signed)
[facility]  ROUNDING NOTE   Subjective:  Patient seen and evaluated during dialysis treatment. Blood flow rate 400. Ultrafiltration target 1.5 kg.  Objective:  Vital signs in last 24 hours:  Temperature 98 pulse 84 respirations 22 blood pressure 126/57  Physical Exam: General: Critically ill-appearing  Head: Normocephalic, atraumatic. Moist oral mucosal membranes  Eyes: Anicteric  Neck: Tracheostomy in place  Lungs:  Scattered rhonchi bilateral, vent assisted  Heart: S1S2 irregular  Abdomen:  Soft, nontender, bowel sounds present  Extremities: Trace peripheral edema.  Neurologic: Awake, alert, following commands  Skin: Bilateral upper extremity ecchymoses  Access: Right IJ PermCath    Basic Metabolic Panel: Recent Labs  Lab 02/04/20 0554 02/07/20 0703 02/08/20 0509 02/09/20 0652  NA 136 134*  --  139  K 4.5 5.6* 3.4* 3.8  CL 99 99  --  99  CO2 24 22  --  31  GLUCOSE 138* 160*  --  157*  BUN 65* 76*  --  66*  CREATININE 3.31* 3.58*  --  3.37*  CALCIUM 9.1 9.0  --  8.8*  PHOS 4.3 4.3  --  3.2    Liver Function Tests: Recent Labs  Lab 02/04/20 0554 02/07/20 0703 02/09/20 0652  ALBUMIN 3.0* 2.8* 2.8*   No results for input(s): LIPASE, AMYLASE in the last 168 hours. No results for input(s): AMMONIA in the last 168 hours.  CBC: Recent Labs  Lab 02/04/20 0554 02/07/20 0930 02/09/20 0652  WBC 12.5* 13.1* 13.4*  HGB 9.6* 9.0* 9.0*  HCT 32.2* 31.2* 30.9*  MCV 103.5* 105.1* 105.1*  PLT 65* 64* 45*    Cardiac Enzymes: No results for input(s): CKTOTAL, CKMB, CKMBINDEX, TROPONINI in the last 168 hours.  BNP: Invalid input(s): POCBNP  CBG: No results for input(s): GLUCAP in the last 168 hours.  Microbiology: Results for orders placed or performed during the hospital encounter of 12/25/2019  C difficile quick scan w PCR reflex     Status: None   Collection Time: 12/18/19  4:49 AM   Specimen: STOOL  Result Value Ref Range Status   C Diff  antigen NEGATIVE NEGATIVE Final   C Diff toxin NEGATIVE NEGATIVE Final   C Diff interpretation No C. difficile detected.  Corrected    Comment: Performed at [facility], [place] [address] [place], [place] [address] CORRECTED ON 03/13 AT 1510: PREVIOUSLY REPORTED AS VALID   Culture, respiratory (non-expectorated)     Status: None   Collection Time: 12/24/19  4:00 PM   Specimen: Tracheal Aspirate; Respiratory  Result Value Ref Range Status   Specimen Description TRACHEAL ASPIRATE  Final   Special Requests NONE  Final   Gram Stain   Final    ABUNDANT WBC PRESENT, PREDOMINANTLY PMN ABUNDANT GRAM POSITIVE RODS FEW GRAM NEGATIVE RODS Performed at [facility], [place] [address], [place], [place] [address]    Culture MODERATE PROTEUS MIRABILIS  Final   Report Status 12/26/2019 FINAL  Final   Organism ID, Bacteria PROTEUS MIRABILIS  Final      Susceptibility   Proteus mirabilis - MIC*    AMPICILLIN <=2 SENSITIVE Sensitive     CEFAZOLIN 8 SENSITIVE Sensitive     CEFEPIME <=0.12 SENSITIVE Sensitive     CEFTAZIDIME <=1 SENSITIVE Sensitive     CEFTRIAXONE <=0.25 SENSITIVE Sensitive     CIPROFLOXACIN <=0.25 SENSITIVE Sensitive     GENTAMICIN <=1 SENSITIVE Sensitive     IMIPENEM 4 SENSITIVE Sensitive     TRIMETH/SULFA <=  20 SENSITIVE Sensitive     AMPICILLIN/SULBACTAM <=2 SENSITIVE Sensitive     PIP/TAZO <=4 SENSITIVE Sensitive     * MODERATE PROTEUS MIRABILIS  Culture, blood (routine x 2)     Status: None   Collection Time: 12/25/19  5:16 PM   Specimen: BLOOD  Result Value Ref Range Status   Specimen Description BLOOD RIGHT ANTECUBITAL  Final   Special Requests   Final    BOTTLES DRAWN AEROBIC AND ANAEROBIC Blood Culture adequate volume   Culture   Final    NO GROWTH 5 DAYS Performed at [facility], [place] [address], [place], [place] [address]    Report Status 12/30/2019 FINAL  Final  Culture, blood (routine x 2)     Status: None   Collection Time: 12/25/19  5:18 PM    Specimen: BLOOD  Result Value Ref Range Status   Specimen Description BLOOD RIGHT ANTECUBITAL  Final   Special Requests   Final    BOTTLES DRAWN AEROBIC AND ANAEROBIC Blood Culture adequate volume   Culture   Final    NO GROWTH 5 DAYS Performed at [facility], [place] [address] [place], [place] [address]    Report Status 12/30/2019 FINAL  Final  C difficile quick scan w PCR reflex     Status: Abnormal   Collection Time: 12/26/19 12:11 PM   Specimen: STOOL  Result Value Ref Range Status   C Diff antigen POSITIVE (A) NEGATIVE Final   C Diff toxin POSITIVE (A) NEGATIVE Final    Comment: CRITICAL RESULT CALLED TO, READ BACK BY AND VERIFIED WITH:  RN CHRISSY R. @1817  12/26/2019 AKT    C Diff interpretation Toxin producing C. difficile detected.  Final  Culture, blood (routine x 2)     Status: None   Collection Time: 01/05/20 12:43 PM   Specimen: BLOOD  Result Value Ref Range Status   Specimen Description BLOOD RIGHT ANTECUBITAL  Final   Special Requests   Final    BOTTLES DRAWN AEROBIC AND ANAEROBIC Blood Culture adequate volume   Culture   Final    NO GROWTH 5 DAYS Performed at [facility], [address], [place], [place] [address]    Report Status 01/10/2020 FINAL  Final  Culture, respiratory (non-expectorated)     Status: None   Collection Time: 01/05/20  2:55 PM   Specimen: Tracheal Aspirate; Respiratory  Result Value Ref Range Status   Specimen Description TRACHEAL ASPIRATE  Final   Special Requests NONE  Final   Gram Stain   Final    ABUNDANT WBC PRESENT,BOTH PMN AND MONONUCLEAR ABUNDANT GRAM NEGATIVE RODS Performed at [facility], [place] [address] [place], [place] [address]    Culture   Final    ABUNDANT KLEBSIELLA PNEUMONIAE MODERATE PROTEUS MIRABILIS KLEBSIELLA PNEUMONIAE CONFIRMED CARBAPENEMASE RESISTANT ENTEROBACTERIACAE KLEBSIELLA PNEUMONIAE Confirmed Extended Spectrum Beta-Lactamase Producer (ESBL).  In bloodstream infections from ESBL  organisms, carbapenems are preferred over piperacillin/tazobactam. They are shown to have a lower risk of mortality. KLEBSIELLA PNEUMONIAE MULTI-DRUG RESISTANT ORGANISM    Report Status 01/11/2020 FINAL  Final   Organism ID, Bacteria PROTEUS MIRABILIS  Final   Organism ID, Bacteria KLEBSIELLA PNEUMONIAE  Final      Susceptibility   Klebsiella pneumoniae - MIC*    AMPICILLIN >=32 RESISTANT Resistant     CEFAZOLIN >=64 RESISTANT Resistant     CEFEPIME >=32 RESISTANT Resistant     CEFTAZIDIME >=64 RESISTANT Resistant     CEFTRIAXONE >=  64 RESISTANT Resistant     CIPROFLOXACIN <=0.25 SENSITIVE Sensitive     GENTAMICIN >=16 RESISTANT Resistant     IMIPENEM >=16 RESISTANT Resistant     TRIMETH/SULFA >=320 RESISTANT Resistant     AMPICILLIN/SULBACTAM >=32 RESISTANT Resistant     PIP/TAZO >=128 RESISTANT Resistant     * ABUNDANT KLEBSIELLA PNEUMONIAE   Proteus mirabilis - MIC*    AMPICILLIN <=2 SENSITIVE Sensitive     CEFAZOLIN <=4 SENSITIVE Sensitive     CEFEPIME <=0.12 SENSITIVE Sensitive     CEFTAZIDIME <=1 SENSITIVE Sensitive     CEFTRIAXONE <=0.25 SENSITIVE Sensitive     CIPROFLOXACIN <=0.25 SENSITIVE Sensitive     GENTAMICIN <=1 SENSITIVE Sensitive     IMIPENEM 8 INTERMEDIATE Intermediate     TRIMETH/SULFA <=20 SENSITIVE Sensitive     AMPICILLIN/SULBACTAM <=2 SENSITIVE Sensitive     PIP/TAZO <=4 SENSITIVE Sensitive     * MODERATE PROTEUS MIRABILIS  Culture, blood (routine x 2)     Status: None   Collection Time: 01/05/20  3:04 PM   Specimen: BLOOD  Result Value Ref Range Status   Specimen Description BLOOD RIGHT ANTECUBITAL  Final   Special Requests   Final    BOTTLES DRAWN AEROBIC ONLY Blood Culture adequate volume   Culture   Final    NO GROWTH 5 DAYS Performed at [facility], [address], [place], [place] [address]    Report Status 01/10/2020 FINAL  Final  Carbapenem Resistance Panel     Status: Abnormal   Collection Time: 01/05/20  3:08 PM  Result Value Ref  Range Status   Carba Resistance IMP Gene NOT DETECTED NOT DETECTED Final   Carba Resistance VIM Gene NOT DETECTED NOT DETECTED Final   Carba Resistance NDM Gene NOT DETECTED NOT DETECTED Final   Carba Resistance KPC Gene DETECTED (A) NOT DETECTED Final    Comment: CRITICAL RESULT CALLED TO, READ BACK BY AND VERIFIED WITH: RBV RN D. Fara Olden [phone] [id number] FCP    Carba Resistance OXA48 Gene NOT DETECTED NOT DETECTED Final    Comment: (NOTE) Cepheid Carba-R is an FDA-cleared nucleic acid amplification test  (NAAT)for the detection and differentiation of genes encoding the  most prevalent carbapenemases in bacterial isolate samples. Carbapenemase gene identification and implementation of comprehensive  infection control measures are recommended by the CDC to prevent the  spread of the resistant organisms. Performed at [facility], [place] [address] [place], [place] [address]     Coagulation Studies: No results for input(s): LABPROT, INR in the last 72 hours.  Urinalysis: No results for input(s): COLORURINE, LABSPEC, PHURINE, GLUCOSEU, HGBUR, BILIRUBINUR, KETONESUR, PROTEINUR, UROBILINOGEN, NITRITE, LEUKOCYTESUR in the last 72 hours.  Invalid input(s): APPERANCEUR    Imaging: DG CHEST PORT 1 VIEW  Result Date: 02/08/2020 CLINICAL DATA:  Hypoxia. EXAM: PORTABLE CHEST 1 VIEW COMPARISON:  01/12/2020. FINDINGS: Tracheostomy and right dual-lumen catheter in stable position. Left PICC line tip noted over the left brachiocephalic vein. Prominent cardiomegaly again noted. Diffuse bilateral pulmonary infiltrates/edema again noted. Slight improvement from prior exam may be present. Persistent bibasilar atelectasis bilateral pleural effusions again noted, worsened from prior exam. No pneumothorax. IMPRESSION: 1. Tracheostomy tube right dual-lumen central catheter in stable position. Left PICC line noted with tip over left brachiocephalic vein. 2. Prominent cardiomegaly again noted. Diffuse  bilateral pulmonary infiltrates/edema again noted. Slight improvement in aeration from prior exam may be present. Persistent bibasilar atelectasis. Bilateral pleural effusions again noted, worsened from prior exam. Electronically Signed  By: [place]   On: 02/08/2020 13:28     Medications:     iohexol  Assessment/ Plan:  69 y.o. male with a PMHx of ESRD on HD, CHF, aortic stenosis, hypertension, diabetes mellitus type 2, hyperlipidemia, obstructive sleep apnea, chronic venous stasis with LE edema, and obesity who was admitted to [facility] on 01/05/2020 for ongoing treatment of respiratory failure, ESRD, malnutrition, and generalized debility.    1.  ESRD on HD MWF.    Patient seen and evaluated during dialysis treatment.  Tolerating well.  Ultrafiltration target 1.5 kg.  2.  Acute respiratory failure.    Still on the ventilator.  FiO2 up to 50%.  Weaning per pulmonary/critical care.  3.  Secondary hyperparathyroidism.    Phosphorus at target at 3.2.  Continue to monitor.  4.  Anemia of chronic kidney disease.  Hemoglobin currently 9.0.  Maintain the patient on Retacrit 4000 units IV with dialysis.    LOS: 0 Doralee Kocak 5/5/20215:30 PM

## 2020-02-10 ENCOUNTER — Other Ambulatory Visit (HOSPITAL_COMMUNITY): Payer: Medicare Other

## 2020-02-10 DIAGNOSIS — J90 Pleural effusion, not elsewhere classified: Secondary | ICD-10-CM | POA: Diagnosis not present

## 2020-02-10 DIAGNOSIS — I5022 Chronic systolic (congestive) heart failure: Secondary | ICD-10-CM | POA: Diagnosis not present

## 2020-02-10 DIAGNOSIS — J9621 Acute and chronic respiratory failure with hypoxia: Secondary | ICD-10-CM | POA: Diagnosis not present

## 2020-02-10 DIAGNOSIS — I482 Chronic atrial fibrillation, unspecified: Secondary | ICD-10-CM | POA: Diagnosis not present

## 2020-02-10 LAB — SARS CORONAVIRUS 2 (TAT 6-24 HRS): SARS Coronavirus 2: NEGATIVE

## 2020-02-10 NOTE — Progress Notes (Signed)
Pulmonary Critical Care Medicine [place]   PULMONARY CRITICAL CARE SERVICE  PROGRESS NOTE  Date of Service: 02/10/2020  Jose Tucker  GYJ:[id number]  DOB: 12-16-1950   DOA: 12/14/2019  Referring Physician: Merton Border, MD  HPI: Jose Tucker is a 69 y.o. male seen for follow up of Acute on Chronic Respiratory Failure.  Patient currently is on pressure support has been on 45% FiO2 right now is on 12/5  Medications: Reviewed on Rounds  Physical Exam:  Vitals: Temperature is 97.7 pulse 78 respiratory 22 blood pressure is 105/67 saturations 99%  Ventilator Settings on pressure support FiO2 is 45% pressure support 12 PEEP 5  . General: Comfortable at this time . Eyes: Grossly normal lids, irises & conjunctiva . ENT: grossly tongue is normal . Neck: no obvious mass . Cardiovascular: S1 S2 normal no gallop . Respiratory: No rhonchi coarse breath sounds are noted . Abdomen: soft . Skin: no rash seen on limited exam . Musculoskeletal: not rigid . Psychiatric:unable to assess . Neurologic: no seizure no involuntary movements         Lab Data:   Basic Metabolic Panel: Recent Labs  Lab 02/04/20 0554 02/07/20 0703 02/08/20 0509 02/09/20 0652  NA 136 134*  --  139  K 4.5 5.6* 3.4* 3.8  CL 99 99  --  99  CO2 24 22  --  31  GLUCOSE 138* 160*  --  157*  BUN 65* 76*  --  66*  CREATININE 3.31* 3.58*  --  3.37*  CALCIUM 9.1 9.0  --  8.8*  PHOS 4.3 4.3  --  3.2    ABG: Recent Labs  Lab 02/08/20 1238 02/08/20 1445  PHART 7.370 7.397  PCO2ART 54.7* 49.9*  PO2ART 51.1* 106  HCO3 31.0* 30.1*  O2SAT 86.1 99.4    Liver Function Tests: Recent Labs  Lab 02/04/20 0554 02/07/20 0703 02/09/20 0652  ALBUMIN 3.0* 2.8* 2.8*   No results for input(s): LIPASE, AMYLASE in the last 168 hours. No results for input(s): AMMONIA in the last 168 hours.  CBC: Recent Labs  Lab 02/04/20 0554 02/07/20 0930 02/09/20 0652  WBC 12.5* 13.1* 13.4*  HGB 9.6* 9.0*  9.0*  HCT 32.2* 31.2* 30.9*  MCV 103.5* 105.1* 105.1*  PLT 65* 64* 45*    Cardiac Enzymes: No results for input(s): CKTOTAL, CKMB, CKMBINDEX, TROPONINI in the last 168 hours.  BNP (last 3 results) No results for input(s): BNP in the last 8760 hours.  ProBNP (last 3 results) No results for input(s): PROBNP in the last 8760 hours.  Radiological Exams: DG CHEST PORT 1 VIEW  Result Date: 02/08/2020 CLINICAL DATA:  Hypoxia. EXAM: PORTABLE CHEST 1 VIEW COMPARISON:  01/12/2020. FINDINGS: Tracheostomy and right dual-lumen catheter in stable position. Left PICC line tip noted over the left brachiocephalic vein. Prominent cardiomegaly again noted. Diffuse bilateral pulmonary infiltrates/edema again noted. Slight improvement from prior exam may be present. Persistent bibasilar atelectasis bilateral pleural effusions again noted, worsened from prior exam. No pneumothorax. IMPRESSION: 1. Tracheostomy tube right dual-lumen central catheter in stable position. Left PICC line noted with tip over left brachiocephalic vein. 2. Prominent cardiomegaly again noted. Diffuse bilateral pulmonary infiltrates/edema again noted. Slight improvement in aeration from prior exam may be present. Persistent bibasilar atelectasis. Bilateral pleural effusions again noted, worsened from prior exam. Electronically Signed   By: [place]   On: 02/08/2020 13:28    Assessment/Plan Active Problems:   Acute on chronic respiratory failure with hypoxia ([place])  Chronic systolic (congestive) heart failure (HCC)   End stage renal disease on dialysis [facility])   Chronic atrial fibrillation (HCC)   Bilateral pleural effusion   1. Acute on chronic respiratory failure hypoxia we will continue with pressure support titrate oxygen continue pulmonary toilet. 2. Chronic systolic heart failure compensated we will continue to follow 3. End-stage renal disease on hemodialysis 4. Chronic atrial fibrillation rate is controlled 5. Bilateral  pleural effusions at baseline continue to follow-up   I have personally seen and evaluated the patient, evaluated laboratory and imaging results, formulated the assessment and plan and placed orders. The Patient requires high complexity decision making with multiple systems involvement.  Rounds were done with the Respiratory Therapy Director and Staff therapists and discussed with nursing staff also.  Allyne Gee, MD [facility] Pulmonary Critical Care Medicine Sleep Medicine

## 2020-02-11 ENCOUNTER — Other Ambulatory Visit (HOSPITAL_COMMUNITY): Payer: Medicare Other

## 2020-02-11 DIAGNOSIS — J90 Pleural effusion, not elsewhere classified: Secondary | ICD-10-CM | POA: Diagnosis not present

## 2020-02-11 DIAGNOSIS — J9621 Acute and chronic respiratory failure with hypoxia: Secondary | ICD-10-CM | POA: Diagnosis not present

## 2020-02-11 DIAGNOSIS — I5022 Chronic systolic (congestive) heart failure: Secondary | ICD-10-CM | POA: Diagnosis not present

## 2020-02-11 DIAGNOSIS — I482 Chronic atrial fibrillation, unspecified: Secondary | ICD-10-CM | POA: Diagnosis not present

## 2020-02-11 HISTORY — PX: IR THORACENTESIS ASP PLEURAL SPACE W/IMG GUIDE: IMG5380

## 2020-02-11 LAB — CBC
HCT: 30.7 % — ABNORMAL LOW (ref 39.0–52.0)
Hemoglobin: 8.8 g/dL — ABNORMAL LOW (ref 13.0–17.0)
MCH: 29.7 pg (ref 26.0–34.0)
MCHC: 28.7 g/dL — ABNORMAL LOW (ref 30.0–36.0)
MCV: 103.7 fL — ABNORMAL HIGH (ref 80.0–100.0)
Platelets: 47 10*3/uL — ABNORMAL LOW (ref 150–400)
RBC: 2.96 MIL/uL — ABNORMAL LOW (ref 4.22–5.81)
RDW: 19.5 % — ABNORMAL HIGH (ref 11.5–15.5)
WBC: 11.7 10*3/uL — ABNORMAL HIGH (ref 4.0–10.5)
nRBC: 0 % (ref 0.0–0.2)

## 2020-02-11 LAB — RENAL FUNCTION PANEL
Albumin: 2.8 g/dL — ABNORMAL LOW (ref 3.5–5.0)
Anion gap: 14 (ref 5–15)
BUN: 63 mg/dL — ABNORMAL HIGH (ref 8–23)
CO2: 28 mmol/L (ref 22–32)
Calcium: 9.1 mg/dL (ref 8.9–10.3)
Chloride: 97 mmol/L — ABNORMAL LOW (ref 98–111)
Creatinine, Ser: 2.99 mg/dL — ABNORMAL HIGH (ref 0.61–1.24)
GFR calc Af Amer: 24 mL/min — ABNORMAL LOW (ref >60)
GFR calc non Af Amer: 20 mL/min — ABNORMAL LOW (ref >60)
Glucose, Bld: 181 mg/dL — ABNORMAL HIGH (ref 70–99)
Phosphorus: 2.6 mg/dL (ref 2.5–4.6)
Potassium: 4 mmol/L (ref 3.5–5.1)
Sodium: 139 mmol/L (ref 135–145)

## 2020-02-11 MED ORDER — LIDOCAINE HCL (PF) 1 % IJ SOLN
INTRAMUSCULAR | Status: DC | PRN
  Administered 2020-02-11: 10 mL

## 2020-02-11 NOTE — Progress Notes (Addendum)
Pulmonary Critical Care Medicine [place]   PULMONARY CRITICAL CARE SERVICE  PROGRESS NOTE  Date of Service: 02/11/2020  Jose Tucker  VOZ:[id number]  DOB: [birth year]   DOA: 12/20/2019  Referring Physician: Merton Border, MD  HPI: Jose Tucker is a 69 y.o. male seen for follow up of Acute on Chronic Respiratory Failure.  Patient remains on full support on the ventilator at this time assist-control mode rate of 25 FiO2 45%.  Satting well no distress.  Medications: Reviewed on Rounds  Physical Exam:  Vitals: Pulse 83 respirations 26 BP 128/60 O2 sat 100% temp 97.1  Ventilator Settings ventilator AC VC rate 25 tidal volume 500 PEEP of 8 with an FiO2 45%  . General: Comfortable at this time . Eyes: Grossly normal lids, irises & conjunctiva . ENT: grossly tongue is normal . Neck: no obvious mass . Cardiovascular: S1 S2 normal no gallop . Respiratory: No rales or rhonchi noted . Abdomen: soft . Skin: no rash seen on limited exam . Musculoskeletal: not rigid . Psychiatric:unable to assess . Neurologic: no seizure no involuntary movements         Lab Data:   Basic Metabolic Panel: Recent Labs  Lab 02/07/20 0703 02/08/20 0509 02/09/20 0652 02/11/20 0548  NA 134*  --  139 139  K 5.6* 3.4* 3.8 4.0  CL 99  --  99 97*  CO2 22  --  31 28  GLUCOSE 160*  --  157* 181*  BUN 76*  --  66* 63*  CREATININE 3.58*  --  3.37* 2.99*  CALCIUM 9.0  --  8.8* 9.1  PHOS 4.3  --  3.2 2.6    ABG: Recent Labs  Lab 02/08/20 1238 02/08/20 1445  PHART 7.370 7.397  PCO2ART 54.7* 49.9*  PO2ART 51.1* 106  HCO3 31.0* 30.1*  O2SAT 86.1 99.4    Liver Function Tests: Recent Labs  Lab 02/07/20 0703 02/09/20 0652 02/11/20 0548  ALBUMIN 2.8* 2.8* 2.8*   No results for input(s): LIPASE, AMYLASE in the last 168 hours. No results for input(s): AMMONIA in the last 168 hours.  CBC: Recent Labs  Lab 02/07/20 0930 02/09/20 0652 02/11/20 0548  WBC 13.1* 13.4* 11.7*   HGB 9.0* 9.0* 8.8*  HCT 31.2* 30.9* 30.7*  MCV 105.1* 105.1* 103.7*  PLT 64* 45* 47*    Cardiac Enzymes: No results for input(s): CKTOTAL, CKMB, CKMBINDEX, TROPONINI in the last 168 hours.  BNP (last 3 results) No results for input(s): BNP in the last 8760 hours.  ProBNP (last 3 results) No results for input(s): PROBNP in the last 8760 hours.  Radiological Exams: DG Chest 1 View  Result Date: 02/11/2020 CLINICAL DATA:  Status post right thoracentesis. EXAM: CHEST  1 VIEW COMPARISON:  02/08/2020. FINDINGS: Tracheostomy tube, left IJ line, right subclavian line in stable position. Interim right thoracentesis. Small right pneumothorax noted. Cardiomegaly with diffuse bilateral interstitial prominence suggesting CHF. Pneumonitis can not be excluded. Small left pleural effusion. IMPRESSION: 1. Interim right thoracentesis. Small right pneumothorax noted. Close follow-up exams suggested to demonstrate stability. 2.  Lines and tubes stable position. 3. Findings suggest congestive heart failure bilateral interstitial edema. Critical Value/emergent results were called by telephone at the time of interpretation on 02/11/2020 at 11:38 am to provider Donavan Burnet, who verbally acknowledged these results. Electronically Signed   By: Marcello Moores  Register   On: 02/11/2020 11:44   IR THORACENTESIS ASP PLEURAL SPACE W/IMG GUIDE  Result Date: 02/11/2020 INDICATION: Patient history of end-stage renal disease  with respiratory failure now on ventilator and to have bilateral pleural effusions. Procedure request for therapeutic thoracentesis EXAM: ULTRASOUND GUIDED THERAPEUTIC THORACENTESIS MEDICATIONS: Lidocaine 1% 10 mL COMPLICATIONS: Right-sided pneumothorax approximately 10% PROCEDURE: An ultrasound guided thoracentesis was thoroughly discussed with the patient and questions answered. The benefits, risks, alternatives and complications were also discussed. The patient understands and wishes to proceed with the  procedure. Written consent was obtained. Ultrasound was performed to localize and mark an adequate pocket of fluid in the right chest. The area was then prepped and draped in the normal sterile fashion. 1% Lidocaine was used for local anesthesia. Under ultrasound guidance a 6 Fr Safe-T-Centesis catheter was introduced. Thoracentesis was performed. The catheter was removed and a dressing applied. FINDINGS: A total of approximately 2 L of amber color fluid was removed. IMPRESSION: Successful ultrasound guided right-sided therapeutic thoracentesis yielding 2 L amber colored of pleural fluid. Read by: Rushie Nyhan, NP Electronically Signed   By: Jacqulynn Cadet M.D.   On: 02/11/2020 12:10    Assessment/Plan Active Problems:   Acute on chronic respiratory failure with hypoxia (HCC)   Chronic systolic (congestive) heart failure (HCC)   End stage renal disease on dialysis [facility])   Chronic atrial fibrillation (HCC)   Bilateral pleural effusion   1. Acute on chronic respiratory failure hypoxia patient will remain on full support on the ventilator at this time continue to wean per protocol as patient can tolerate.  Continue supportive measures and pulmonary toilet. 2. Chronic systolic heart failure compensated we will continue to follow 3. End-stage renal disease on hemodialysis 4. Chronic atrial fibrillation rate is controlled 5. Bilateral pleural effusions at baseline continue to follow-up   I have personally seen and evaluated the patient, evaluated laboratory and imaging results, formulated the assessment and plan and placed orders. The Patient requires high complexity decision making with multiple systems involvement.  Rounds were done with the Respiratory Therapy Director and Staff therapists and discussed with nursing staff also.  Allyne Gee, MD [facility] Pulmonary Critical Care Medicine Sleep Medicine

## 2020-02-11 NOTE — Progress Notes (Signed)
[facility]  ROUNDING NOTE   Subjective:  Patient due for dialysis treatment today. Resting comfortably in bed at the moment. Still requiring mechanical ventilation.  Objective:  Vital signs in last 24 hours:  Temperature 97.1 pulse 83 respirations 26 blood pressure 128/68  Physical Exam: General: Critically ill-appearing  Head: Normocephalic, atraumatic. Moist oral mucosal membranes  Eyes: Anicteric  Neck: Tracheostomy in place  Lungs:  Scattered rhonchi bilateral, vent assisted  Heart: S1S2 irregular  Abdomen:  Soft, nontender, bowel sounds present  Extremities: Trace peripheral edema.  Neurologic: Awake, alert, following commands  Skin: Bilateral upper extremity ecchymoses  Access: Right IJ PermCath    Basic Metabolic Panel: Recent Labs  Lab 02/07/20 0703 02/08/20 0509 02/09/20 0652 02/11/20 0548  NA 134*  --  139 139  K 5.6* 3.4* 3.8 4.0  CL 99  --  99 97*  CO2 22  --  31 28  GLUCOSE 160*  --  157* 181*  BUN 76*  --  66* 63*  CREATININE 3.58*  --  3.37* 2.99*  CALCIUM 9.0  --  8.8* 9.1  PHOS 4.3  --  3.2 2.6    Liver Function Tests: Recent Labs  Lab 02/07/20 0703 02/09/20 0652 02/11/20 0548  ALBUMIN 2.8* 2.8* 2.8*   No results for input(s): LIPASE, AMYLASE in the last 168 hours. No results for input(s): AMMONIA in the last 168 hours.  CBC: Recent Labs  Lab 02/07/20 0930 02/09/20 0652 02/11/20 0548  WBC 13.1* 13.4* 11.7*  HGB 9.0* 9.0* 8.8*  HCT 31.2* 30.9* 30.7*  MCV 105.1* 105.1* 103.7*  PLT 64* 45* 47*    Cardiac Enzymes: No results for input(s): CKTOTAL, CKMB, CKMBINDEX, TROPONINI in the last 168 hours.  BNP: Invalid input(s): POCBNP  CBG: No results for input(s): GLUCAP in the last 168 hours.  Microbiology: Results for orders placed or performed during the hospital encounter of 12/08/2019  C difficile quick scan w PCR reflex     Status: None   Collection Time: 12/18/19  4:49 AM   Specimen: STOOL  Result Value Ref  Range Status   C Diff antigen NEGATIVE NEGATIVE Final   C Diff toxin NEGATIVE NEGATIVE Final   C Diff interpretation No C. difficile detected.  Corrected    Comment: Performed at [facility], [place] [address], [place], [place] [address] CORRECTED ON 03/13 AT 1510: PREVIOUSLY REPORTED AS VALID   Culture, respiratory (non-expectorated)     Status: None   Collection Time: 12/24/19  4:00 PM   Specimen: Tracheal Aspirate; Respiratory  Result Value Ref Range Status   Specimen Description TRACHEAL ASPIRATE  Final   Special Requests NONE  Final   Gram Stain   Final    ABUNDANT WBC PRESENT, PREDOMINANTLY PMN ABUNDANT GRAM POSITIVE RODS FEW GRAM NEGATIVE RODS Performed at [facility], [place] [address] [place], [place] [address]    Culture MODERATE PROTEUS MIRABILIS  Final   Report Status 12/26/2019 FINAL  Final   Organism ID, Bacteria PROTEUS MIRABILIS  Final      Susceptibility   Proteus mirabilis - MIC*    AMPICILLIN <=2 SENSITIVE Sensitive     CEFAZOLIN 8 SENSITIVE Sensitive     CEFEPIME <=0.12 SENSITIVE Sensitive     CEFTAZIDIME <=1 SENSITIVE Sensitive     CEFTRIAXONE <=0.25 SENSITIVE Sensitive     CIPROFLOXACIN <=0.25 SENSITIVE Sensitive     GENTAMICIN <=1 SENSITIVE Sensitive     IMIPENEM 4 SENSITIVE Sensitive  TRIMETH/SULFA <=20 SENSITIVE Sensitive     AMPICILLIN/SULBACTAM <=2 SENSITIVE Sensitive     PIP/TAZO <=4 SENSITIVE Sensitive     * MODERATE PROTEUS MIRABILIS  Culture, blood (routine x 2)     Status: None   Collection Time: 12/25/19  5:16 PM   Specimen: BLOOD  Result Value Ref Range Status   Specimen Description BLOOD RIGHT ANTECUBITAL  Final   Special Requests   Final    BOTTLES DRAWN AEROBIC AND ANAEROBIC Blood Culture adequate volume   Culture   Final    NO GROWTH 5 DAYS Performed at [facility], [place] [address], [place], [place] [address]    Report Status 12/30/2019 FINAL  Final  Culture, blood (routine x 2)     Status: None   Collection  Time: 12/25/19  5:18 PM   Specimen: BLOOD  Result Value Ref Range Status   Specimen Description BLOOD RIGHT ANTECUBITAL  Final   Special Requests   Final    BOTTLES DRAWN AEROBIC AND ANAEROBIC Blood Culture adequate volume   Culture   Final    NO GROWTH 5 DAYS Performed at [facility], [place] [address] [place], [place] [address]    Report Status 12/30/2019 FINAL  Final  C difficile quick scan w PCR reflex     Status: Abnormal   Collection Time: 12/26/19 12:11 PM   Specimen: STOOL  Result Value Ref Range Status   C Diff antigen POSITIVE (A) NEGATIVE Final   C Diff toxin POSITIVE (A) NEGATIVE Final    Comment: CRITICAL RESULT CALLED TO, READ BACK BY AND VERIFIED WITH:  RN CHRISSY R. @1817  12/26/2019 AKT    C Diff interpretation Toxin producing C. difficile detected.  Final  Culture, blood (routine x 2)     Status: None   Collection Time: 01/05/20 12:43 PM   Specimen: BLOOD  Result Value Ref Range Status   Specimen Description BLOOD RIGHT ANTECUBITAL  Final   Special Requests   Final    BOTTLES DRAWN AEROBIC AND ANAEROBIC Blood Culture adequate volume   Culture   Final    NO GROWTH 5 DAYS Performed at [facility], [address] [place], [place] [address]    Report Status 01/10/2020 FINAL  Final  Culture, respiratory (non-expectorated)     Status: None   Collection Time: 01/05/20  2:55 PM   Specimen: Tracheal Aspirate; Respiratory  Result Value Ref Range Status   Specimen Description TRACHEAL ASPIRATE  Final   Special Requests NONE  Final   Gram Stain   Final    ABUNDANT WBC PRESENT,BOTH PMN AND MONONUCLEAR ABUNDANT GRAM NEGATIVE RODS Performed at [facility], [place] [address], [place], [place] [address]    Culture   Final    ABUNDANT KLEBSIELLA PNEUMONIAE MODERATE PROTEUS MIRABILIS KLEBSIELLA PNEUMONIAE CONFIRMED CARBAPENEMASE RESISTANT ENTEROBACTERIACAE KLEBSIELLA PNEUMONIAE Confirmed Extended Spectrum Beta-Lactamase Producer (ESBL).  In bloodstream  infections from ESBL organisms, carbapenems are preferred over piperacillin/tazobactam. They are shown to have a lower risk of mortality. KLEBSIELLA PNEUMONIAE MULTI-DRUG RESISTANT ORGANISM    Report Status 01/11/2020 FINAL  Final   Organism ID, Bacteria PROTEUS MIRABILIS  Final   Organism ID, Bacteria KLEBSIELLA PNEUMONIAE  Final      Susceptibility   Klebsiella pneumoniae - MIC*    AMPICILLIN >=32 RESISTANT Resistant     CEFAZOLIN >=64 RESISTANT Resistant     CEFEPIME >=32 RESISTANT Resistant     CEFTAZIDIME >=64 RESISTANT Resistant  CEFTRIAXONE >=64 RESISTANT Resistant     CIPROFLOXACIN <=0.25 SENSITIVE Sensitive     GENTAMICIN >=16 RESISTANT Resistant     IMIPENEM >=16 RESISTANT Resistant     TRIMETH/SULFA >=320 RESISTANT Resistant     AMPICILLIN/SULBACTAM >=32 RESISTANT Resistant     PIP/TAZO >=128 RESISTANT Resistant     * ABUNDANT KLEBSIELLA PNEUMONIAE   Proteus mirabilis - MIC*    AMPICILLIN <=2 SENSITIVE Sensitive     CEFAZOLIN <=4 SENSITIVE Sensitive     CEFEPIME <=0.12 SENSITIVE Sensitive     CEFTAZIDIME <=1 SENSITIVE Sensitive     CEFTRIAXONE <=0.25 SENSITIVE Sensitive     CIPROFLOXACIN <=0.25 SENSITIVE Sensitive     GENTAMICIN <=1 SENSITIVE Sensitive     IMIPENEM 8 INTERMEDIATE Intermediate     TRIMETH/SULFA <=20 SENSITIVE Sensitive     AMPICILLIN/SULBACTAM <=2 SENSITIVE Sensitive     PIP/TAZO <=4 SENSITIVE Sensitive     * MODERATE PROTEUS MIRABILIS  Culture, blood (routine x 2)     Status: None   Collection Time: 01/05/20  3:04 PM   Specimen: BLOOD  Result Value Ref Range Status   Specimen Description BLOOD RIGHT ANTECUBITAL  Final   Special Requests   Final    BOTTLES DRAWN AEROBIC ONLY Blood Culture adequate volume   Culture   Final    NO GROWTH 5 DAYS Performed at [facility], [address] [place], [place] [address]    Report Status 01/10/2020 FINAL  Final  Carbapenem Resistance Panel     Status: Abnormal   Collection Time: 01/05/20  3:08 PM   Result Value Ref Range Status   Carba Resistance IMP Gene NOT DETECTED NOT DETECTED Final   Carba Resistance VIM Gene NOT DETECTED NOT DETECTED Final   Carba Resistance NDM Gene NOT DETECTED NOT DETECTED Final   Carba Resistance KPC Gene DETECTED (A) NOT DETECTED Final    Comment: CRITICAL RESULT CALLED TO, READ BACK BY AND VERIFIED WITH: RBV RN D. Fara Olden [phone] FCP    Carba Resistance OXA48 Gene NOT DETECTED NOT DETECTED Final    Comment: (NOTE) Cepheid Carba-R is an FDA-cleared nucleic acid amplification test  (NAAT)for the detection and differentiation of genes encoding the  most prevalent carbapenemases in bacterial isolate samples. Carbapenemase gene identification and implementation of comprehensive  infection control measures are recommended by the CDC to prevent the  spread of the resistant organisms. Performed at [facility], [place] [address], [place], [place] [address]   SARS CORONAVIRUS 2 (TAT 6-24 HRS) Nasopharyngeal Nasopharyngeal Swab     Status: None   Collection Time: 02/10/20  3:46 PM   Specimen: Nasopharyngeal Swab  Result Value Ref Range Status   SARS Coronavirus 2 NEGATIVE NEGATIVE Final    Comment: (NOTE) SARS-CoV-2 target nucleic acids are NOT DETECTED. The SARS-CoV-2 RNA is generally detectable in upper and lower respiratory specimens during the acute phase of infection. Negative results do not preclude SARS-CoV-2 infection, do not rule out co-infections with other pathogens, and should not be used as the sole basis for treatment or other patient management decisions. Negative results must be combined with clinical observations, patient history, and epidemiological information. The expected result is Negative. Fact Sheet for Patients: SugarRoll.be Fact Sheet for Healthcare Providers: https://www.woods-mathews.com/ This test is not yet approved or cleared by the [place] FDA and  has been authorized  for detection and/or diagnosis of SARS-CoV-2 by FDA under an Emergency Use Authorization (EUA). This EUA will remain  in effect (meaning  this test can be used) for the duration of the COVID-19 declaration under Section 56 4(b)(1) of the Act, 21 U.S.C. section 360bbb-3(b)(1), unless the authorization is terminated or revoked sooner. Performed at [facility], [place] [address], [place], [place] [address]     Coagulation Studies: No results for input(s): LABPROT, INR in the last 72 hours.  Urinalysis: No results for input(s): COLORURINE, LABSPEC, PHURINE, GLUCOSEU, HGBUR, BILIRUBINUR, KETONESUR, PROTEINUR, UROBILINOGEN, NITRITE, LEUKOCYTESUR in the last 72 hours.  Invalid input(s): APPERANCEUR    Imaging: No results found.   Medications:     iohexol  Assessment/ Plan:  69 y.o. male with a PMHx of ESRD on HD, CHF, aortic stenosis, hypertension, diabetes mellitus type 2, hyperlipidemia, obstructive sleep apnea, chronic venous stasis with LE edema, and obesity who was admitted to [facility] on 12/21/2019 for ongoing treatment of respiratory failure, ESRD, malnutrition, and generalized debility.    1.  ESRD on HD MWF.    Patient due for hemodialysis today.  Orders have been prepared.  2.  Acute respiratory failure.    Patient has been difficult to wean from the ventilator.  Still requiring ventilatory support.  Continue mechanical ventilation at this time.  3.  Secondary hyperparathyroidism.    Phosphorus 2.6 at last check.  Continue to monitor bone mineral metabolism parameters.  4.  Anemia of chronic kidney disease.  Hemoglobin down slightly to 8.8.  Maintain the patient on Retacrit 4000 units.    LOS: 0 Levert Heslop 5/7/20218:27 AM

## 2020-02-11 NOTE — Procedures (Signed)
Ultrasound-guide therapeutic right sided thoracentesis performed yielding 2liters of amber colored fluid. No immediate complications. Follow-up chest x-ray pending. EBL is < 2 ml.

## 2020-02-12 ENCOUNTER — Other Ambulatory Visit (HOSPITAL_COMMUNITY): Payer: Medicare Other

## 2020-02-12 DIAGNOSIS — J90 Pleural effusion, not elsewhere classified: Secondary | ICD-10-CM | POA: Diagnosis not present

## 2020-02-12 DIAGNOSIS — J9621 Acute and chronic respiratory failure with hypoxia: Secondary | ICD-10-CM | POA: Diagnosis not present

## 2020-02-12 DIAGNOSIS — I482 Chronic atrial fibrillation, unspecified: Secondary | ICD-10-CM | POA: Diagnosis not present

## 2020-02-12 DIAGNOSIS — I5022 Chronic systolic (congestive) heart failure: Secondary | ICD-10-CM | POA: Diagnosis not present

## 2020-02-12 NOTE — Progress Notes (Signed)
Pulmonary [place]   PULMONARY CRITICAL CARE SERVICE  PROGRESS NOTE  Date of Service: 02/12/2020  Jose Tucker  [id number]:[id number]  DOB: 1951/01/30   DOA: 12/19/2019  Referring Physician: Merton Border, MD  HPI: Jose Tucker is a 69 y.o. male seen for follow up of Acute on Chronic Respiratory Failure.  Patient currently is on assist control has been on FiO2 of 45% with a PEEP of 8 good saturations are noted  Medications: Reviewed on Rounds  Physical Exam:  Vitals: Temperature 98.3 pulse 78 respiratory 22 blood pressure is 123/69 saturations 100%  Ventilator Settings on assist control FiO2 45% tidal volume 523 PEEP 8  . General: Comfortable at this time . Eyes: Grossly normal lids, irises & conjunctiva . ENT: grossly tongue is normal . Neck: no obvious mass . Cardiovascular: S1 S2 normal no gallop . Respiratory: No rhonchi no rales are noted at this time . Abdomen: soft . Skin: no rash seen on limited exam . Musculoskeletal: not rigid . Psychiatric:unable to assess . Neurologic: no seizure no involuntary movements         Lab Data:   Basic Metabolic Panel: Recent Labs  Lab 02/07/20 0703 02/08/20 0509 02/09/20 0652 02/11/20 0548  NA 134*  --  139 139  K 5.6* 3.4* 3.8 4.0  CL 99  --  99 97*  CO2 22  --  31 28  GLUCOSE 160*  --  157* 181*  BUN 76*  --  66* 63*  CREATININE 3.58*  --  3.37* 2.99*  CALCIUM 9.0  --  8.8* 9.1  PHOS 4.3  --  3.2 2.6    ABG: Recent Labs  Lab 02/08/20 1238 02/08/20 1445  PHART 7.370 7.397  PCO2ART 54.7* 49.9*  PO2ART 51.1* 106  HCO3 31.0* 30.1*  O2SAT 86.1 99.4    Liver Function Tests: Recent Labs  Lab 02/07/20 0703 02/09/20 0652 02/11/20 0548  ALBUMIN 2.8* 2.8* 2.8*   No results for input(s): LIPASE, AMYLASE in the last 168 hours. No results for input(s): AMMONIA in the last 168 hours.  CBC: Recent Labs  Lab 02/07/20 0930 02/09/20 0652 02/11/20 0548  WBC 13.1* 13.4* 11.7*   HGB 9.0* 9.0* 8.8*  HCT 31.2* 30.9* 30.7*  MCV 105.1* 105.1* 103.7*  PLT 64* 45* 47*    Cardiac Enzymes: No results for input(s): CKTOTAL, CKMB, CKMBINDEX, TROPONINI in the last 168 hours.  BNP (last 3 results) No results for input(s): BNP in the last 8760 hours.  ProBNP (last 3 results) No results for input(s): PROBNP in the last 8760 hours.  Radiological Exams: DG Chest 1 View  Result Date: 02/11/2020 CLINICAL DATA:  Status post right thoracentesis. EXAM: CHEST  1 VIEW COMPARISON:  02/08/2020. FINDINGS: Tracheostomy tube, left IJ line, right subclavian line in stable position. Interim right thoracentesis. Small right pneumothorax noted. Cardiomegaly with diffuse bilateral interstitial prominence suggesting CHF. Pneumonitis can not be excluded. Small left pleural effusion. IMPRESSION: 1. Interim right thoracentesis. Small right pneumothorax noted. Close follow-up exams suggested to demonstrate stability. 2.  Lines and tubes stable position. 3. Findings suggest congestive heart failure bilateral interstitial edema. Critical Value/emergent results were called by telephone at the time of interpretation on 02/11/2020 at 11:38 am to provider Donavan Burnet, who verbally acknowledged these results. Electronically Signed   By: Marcello Moores  Register   On: 02/11/2020 11:44   DG CHEST PORT 1 VIEW  Result Date: 02/12/2020 CLINICAL DATA:  Pneumothorax. EXAM: PORTABLE CHEST 1 VIEW COMPARISON:  Chest radiograph 02/11/2020. FINDINGS: Unchanged position of a right subclavian approach central venous catheter, tracheostomy tube and left IJ approach line. Persistent small right pneumothorax now seen at the medial right lung base and right lung apex. Unchanged cardiomegaly. Bibasilar atelectasis, increased on the right. A small right pleural effusion is difficult to exclude. Unchanged small left pleural effusion. Bilateral interstitial and ill-defined airspace opacities have slightly progressed as compared to prior  exam 02/11/2020. Findings likely reflect pulmonary edema. IMPRESSION: Lines and tubes unchanged in position. Persistent small right pneumothorax now seen at the right lung apex and medial right lung base. Bibasilar atelectasis, increased on the right. A small right pleural effusion is difficult to exclude. Unchanged small left pleural effusion. Bilateral interstitial and ill-defined airspace opacities have slightly progressed and likely reflect pulmonary edema. Infection cannot be excluded. Electronically Signed   By: Kellie Simmering DO   On: 02/12/2020 08:03   IR THORACENTESIS ASP PLEURAL SPACE W/IMG GUIDE  Result Date: 02/11/2020 INDICATION: Patient history of end-stage renal disease with respiratory failure now on ventilator and to have bilateral pleural effusions. Procedure request for therapeutic thoracentesis EXAM: ULTRASOUND GUIDED THERAPEUTIC THORACENTESIS MEDICATIONS: Lidocaine 1% 10 mL COMPLICATIONS: Right-sided pneumothorax approximately 10% PROCEDURE: An ultrasound guided thoracentesis was thoroughly discussed with the patient and questions answered. The benefits, risks, alternatives and complications were also discussed. The patient understands and wishes to proceed with the procedure. Written consent was obtained. Ultrasound was performed to localize and mark an adequate pocket of fluid in the right chest. The area was then prepped and draped in the normal sterile fashion. 1% Lidocaine was used for local anesthesia. Under ultrasound guidance a 6 Fr Safe-T-Centesis catheter was introduced. Thoracentesis was performed. The catheter was removed and a dressing applied. FINDINGS: A total of approximately 2 L of amber color fluid was removed. IMPRESSION: Successful ultrasound guided right-sided therapeutic thoracentesis yielding 2 L amber colored of pleural fluid. Read by: Rushie Nyhan, NP Electronically Signed   By: Jacqulynn Cadet M.D.   On: 02/11/2020 12:10    Assessment/Plan Active  Problems:   Acute on chronic respiratory failure with hypoxia (HCC)   Chronic systolic (congestive) heart failure (HCC)   End stage renal disease on dialysis [facility])   Chronic atrial fibrillation (HCC)   Bilateral pleural effusion   1. Acute on chronic respiratory failure with hypoxia we will continue with full support on assist control mode titrate oxygen as tolerated patient has not been tolerating the pressure support today reportedly 2. Chronic systolic heart failure compensated monitor fluid status 3. End-stage renal disease on hemodialysis followed by nephrology 4. Chronic atrial fibrillation rate controlled 5. Bilateral effusions secondary to heart failure monitor fluid status   I have personally seen and evaluated the patient, evaluated laboratory and imaging results, formulated the assessment and plan and placed orders. The Patient requires high complexity decision making with multiple systems involvement.  Rounds were done with the Respiratory Therapy Director and Staff therapists and discussed with nursing staff also.  Allyne Gee, MD [facility] Pulmonary Critical Care Medicine Sleep Medicine

## 2020-02-12 NOTE — Progress Notes (Signed)
   Rt Thoracentesis performed yesterday Small 10% Rt PTX post procedure  CXR reviewed with Dr Laurence Ferrari this am CXR unchanged per MD  I called RN She reports pt is asymptomatic IN NAD Continues on vent  Will recheck CXR 24 hrs-- No plan for any type of intervention at this time per Dr Laurence Ferrari

## 2020-02-13 ENCOUNTER — Other Ambulatory Visit (HOSPITAL_COMMUNITY): Payer: Medicare Other

## 2020-02-13 DIAGNOSIS — I5022 Chronic systolic (congestive) heart failure: Secondary | ICD-10-CM | POA: Diagnosis not present

## 2020-02-13 DIAGNOSIS — J9621 Acute and chronic respiratory failure with hypoxia: Secondary | ICD-10-CM | POA: Diagnosis not present

## 2020-02-13 DIAGNOSIS — I482 Chronic atrial fibrillation, unspecified: Secondary | ICD-10-CM | POA: Diagnosis not present

## 2020-02-13 DIAGNOSIS — J90 Pleural effusion, not elsewhere classified: Secondary | ICD-10-CM | POA: Diagnosis not present

## 2020-02-13 NOTE — Progress Notes (Signed)
Pulmonary Critical Care Medicine [place]   PULMONARY CRITICAL CARE SERVICE  PROGRESS NOTE  Date of Service: 02/13/2020  Jose Tucker  [id number]  DOB: 1950/11/14   DOA: 01/02/2020  Referring Physician: Merton Border, MD  HPI: Jose Tucker is a 69 y.o. male seen for follow up of Acute on Chronic Respiratory Failure.  Patient had a postprocedure pneumothorax on May 7 the patient had a chest x-ray done which revealed really no change and no increase in size.  Right now patient is stable the radiologist is going to continue to monitor the pneumothorax if there is any decompensation might need a chest tube  Medications: Reviewed on Rounds  Physical Exam:  Vitals: Temperature 96.1 pulse 80 respiratory 35 blood pressure is 114/60 saturations 98%  Ventilator Settings on the vent on assist control FiO2 40% tidal volume 577 PEEP 8  . General: Comfortable at this time . Eyes: Grossly normal lids, irises & conjunctiva . ENT: grossly tongue is normal . Neck: no obvious mass . Cardiovascular: S1 S2 normal no gallop . Respiratory: No rhonchi coarse breath sounds . Abdomen: soft . Skin: no rash seen on limited exam . Musculoskeletal: not rigid . Psychiatric:unable to assess . Neurologic: no seizure no involuntary movements         Lab Data:   Basic Metabolic Panel: Recent Labs  Lab 02/07/20 0703 02/08/20 0509 02/09/20 0652 02/11/20 0548  NA 134*  --  139 139  K 5.6* 3.4* 3.8 4.0  CL 99  --  99 97*  CO2 22  --  31 28  GLUCOSE 160*  --  157* 181*  BUN 76*  --  66* 63*  CREATININE 3.58*  --  3.37* 2.99*  CALCIUM 9.0  --  8.8* 9.1  PHOS 4.3  --  3.2 2.6    ABG: Recent Labs  Lab 02/08/20 1238 02/08/20 1445  PHART 7.370 7.397  PCO2ART 54.7* 49.9*  PO2ART 51.1* 106  HCO3 31.0* 30.1*  O2SAT 86.1 99.4    Liver Function Tests: Recent Labs  Lab 02/07/20 0703 02/09/20 0652 02/11/20 0548  ALBUMIN 2.8* 2.8* 2.8*   No results for input(s): LIPASE,  AMYLASE in the last 168 hours. No results for input(s): AMMONIA in the last 168 hours.  CBC: Recent Labs  Lab 02/07/20 0930 02/09/20 0652 02/11/20 0548  WBC 13.1* 13.4* 11.7*  HGB 9.0* 9.0* 8.8*  HCT 31.2* 30.9* 30.7*  MCV 105.1* 105.1* 103.7*  PLT 64* 45* 47*    Cardiac Enzymes: No results for input(s): CKTOTAL, CKMB, CKMBINDEX, TROPONINI in the last 168 hours.  BNP (last 3 results) No results for input(s): BNP in the last 8760 hours.  ProBNP (last 3 results) No results for input(s): PROBNP in the last 8760 hours.  Radiological Exams: DG Chest 1 View  Result Date: 02/11/2020 CLINICAL DATA:  Status post right thoracentesis. EXAM: CHEST  1 VIEW COMPARISON:  02/08/2020. FINDINGS: Tracheostomy tube, left IJ line, right subclavian line in stable position. Interim right thoracentesis. Small right pneumothorax noted. Cardiomegaly with diffuse bilateral interstitial prominence suggesting CHF. Pneumonitis can not be excluded. Small left pleural effusion. IMPRESSION: 1. Interim right thoracentesis. Small right pneumothorax noted. Close follow-up exams suggested to demonstrate stability. 2.  Lines and tubes stable position. 3. Findings suggest congestive heart failure bilateral interstitial edema. Critical Value/emergent results were called by telephone at the time of interpretation on 02/11/2020 at 11:38 am to provider Donavan Burnet, who verbally acknowledged these results. Electronically Signed   By: Marcello Moores  Register   On: 02/11/2020 11:44   DG Chest Port 1 View  Result Date: 02/13/2020 CLINICAL DATA:  Respiratory failure EXAM: PORTABLE CHEST 1 VIEW COMPARISON:  Feb 12, 2020 FINDINGS: Tracheostomy catheter tip is 5.5 cm above the carina. Dual lumen central catheter tip is in the superior vena cava near the cavoatrial junction. Left jugular catheter tip is in the left innominate vein. There is a lateral pneumothorax on the right, without tension component. There is extensive airspace  consolidation throughout the left lung with moderate left pleural effusion. There is consolidation in the right lower lung region as well. There is stable cardiomegaly. The pulmonary vascularity is normal. No adenopathy. IMPRESSION: Tube and catheter positions as described. There is now a lateral pneumothorax on the right without tension component. There is new extensive airspace consolidation throughout the left lung with left pleural effusion. Consolidation persists in the right base. There is stable cardiomegaly. Critical Value/emergent results were called by telephone at the time of interpretation on 02/13/2020 at 8:34 am to provider Janora Norlander, who verbally acknowledged these results. Electronically Signed   By: Lowella Grip III M.D.   On: 02/13/2020 08:34   DG CHEST PORT 1 VIEW  Result Date: 02/12/2020 CLINICAL DATA:  Pneumothorax. EXAM: PORTABLE CHEST 1 VIEW COMPARISON:  Chest radiograph 02/11/2020. FINDINGS: Unchanged position of a right subclavian approach central venous catheter, tracheostomy tube and left IJ approach line. Persistent small right pneumothorax now seen at the medial right lung base and right lung apex. Unchanged cardiomegaly. Bibasilar atelectasis, increased on the right. A small right pleural effusion is difficult to exclude. Unchanged small left pleural effusion. Bilateral interstitial and ill-defined airspace opacities have slightly progressed as compared to prior exam 02/11/2020. Findings likely reflect pulmonary edema. IMPRESSION: Lines and tubes unchanged in position. Persistent small right pneumothorax now seen at the right lung apex and medial right lung base. Bibasilar atelectasis, increased on the right. A small right pleural effusion is difficult to exclude. Unchanged small left pleural effusion. Bilateral interstitial and ill-defined airspace opacities have slightly progressed and likely reflect pulmonary edema. Infection cannot be excluded. Electronically Signed   By:  Kellie Simmering DO   On: 02/12/2020 08:03    Assessment/Plan Active Problems:   Acute on chronic respiratory failure with hypoxia (HCC)   Chronic systolic (congestive) heart failure (HCC)   End stage renal disease on dialysis [facility])   Chronic atrial fibrillation (HCC)   Bilateral pleural effusion   1. Acute on chronic respiratory failure hypoxia plan is to continue with full support on assist control mode right now patient has not been tolerating attempts at weaning. 2. Chronic systolic heart failure compensated at this time we will continue to monitor 3. End-stage renal failure on hemodialysis continue to follow along 4. Chronic atrial fibrillation rate is controlled 5. Bilateral pleural effusions no change we will continue to monitor closely.   I have personally seen and evaluated the patient, evaluated laboratory and imaging results, formulated the assessment and plan and placed orders. The Patient requires high complexity decision making with multiple systems involvement.  Rounds were done with the Respiratory Therapy Director and Staff therapists and discussed with nursing staff also.  Allyne Gee, MD [facility] Pulmonary Critical Care Medicine Sleep Medicine

## 2020-02-13 NOTE — Progress Notes (Signed)
   Rt Thoracentesis performed 5/7 Small 10% Rt PTX post procedure  CXR reviewed with Dr Laurence Ferrari this am Still no real change--- possible shift in stable ptx-- pt position maybe No increase in size  I spoke to [facility] She reports pt is asymptomatic IN NAD Continues on vent   No plan for any type of intervention at this time per Dr Laurence Ferrari

## 2020-02-14 ENCOUNTER — Other Ambulatory Visit (HOSPITAL_COMMUNITY): Payer: Medicare Other

## 2020-02-14 DIAGNOSIS — I482 Chronic atrial fibrillation, unspecified: Secondary | ICD-10-CM | POA: Diagnosis not present

## 2020-02-14 DIAGNOSIS — I5022 Chronic systolic (congestive) heart failure: Secondary | ICD-10-CM | POA: Diagnosis not present

## 2020-02-14 DIAGNOSIS — J9621 Acute and chronic respiratory failure with hypoxia: Secondary | ICD-10-CM | POA: Diagnosis not present

## 2020-02-14 DIAGNOSIS — J90 Pleural effusion, not elsewhere classified: Secondary | ICD-10-CM | POA: Diagnosis not present

## 2020-02-14 LAB — RENAL FUNCTION PANEL
Albumin: 2.3 g/dL — ABNORMAL LOW (ref 3.5–5.0)
Anion gap: 11 (ref 5–15)
BUN: 82 mg/dL — ABNORMAL HIGH (ref 8–23)
CO2: 27 mmol/L (ref 22–32)
Calcium: 8.7 mg/dL — ABNORMAL LOW (ref 8.9–10.3)
Chloride: 101 mmol/L (ref 98–111)
Creatinine, Ser: 3.5 mg/dL — ABNORMAL HIGH (ref 0.61–1.24)
GFR calc Af Amer: 20 mL/min — ABNORMAL LOW (ref >60)
GFR calc non Af Amer: 17 mL/min — ABNORMAL LOW (ref >60)
Glucose, Bld: 236 mg/dL — ABNORMAL HIGH (ref 70–99)
Phosphorus: 2 mg/dL — ABNORMAL LOW (ref 2.5–4.6)
Potassium: 4.4 mmol/L (ref 3.5–5.1)
Sodium: 139 mmol/L (ref 135–145)

## 2020-02-14 LAB — CBC
HCT: 28.1 % — ABNORMAL LOW (ref 39.0–52.0)
Hemoglobin: 8.1 g/dL — ABNORMAL LOW (ref 13.0–17.0)
MCH: 29.1 pg (ref 26.0–34.0)
MCHC: 28.8 g/dL — ABNORMAL LOW (ref 30.0–36.0)
MCV: 101.1 fL — ABNORMAL HIGH (ref 80.0–100.0)
Platelets: 41 10*3/uL — ABNORMAL LOW (ref 150–400)
RBC: 2.78 MIL/uL — ABNORMAL LOW (ref 4.22–5.81)
RDW: 19.1 % — ABNORMAL HIGH (ref 11.5–15.5)
WBC: 12.1 10*3/uL — ABNORMAL HIGH (ref 4.0–10.5)
nRBC: 0 % (ref 0.0–0.2)

## 2020-02-14 NOTE — Progress Notes (Addendum)
Pulmonary Critical Care Medicine [name]   PULMONARY CRITICAL CARE SERVICE  PROGRESS NOTE  Date of Service: 02/14/2020  Jose Tucker  [id number]  DOB: 09-15-1951   DOA: 01/04/2020  Referring Physician: Merton Border, MD  HPI: Jose Tucker is a 69 y.o. male seen for follow up of Acute on Chronic Respiratory Failure.  Patient currently is on assist control mode has been on 40% FiO2 with good tidal volumes.  Medications: Reviewed on Rounds  Physical Exam:  Vitals: Temperature is 96.6 pulse 78 respiratory rate 37 blood pressure is 107/63 saturations 100%  Ventilator Settings on assist control FiO2 is 40% tidal volume 500 with a PEEP of 8  . General: Comfortable at this time . Eyes: Grossly normal lids, irises & conjunctiva . ENT: grossly tongue is normal . Neck: no obvious mass . Cardiovascular: S1 S2 normal no gallop . Respiratory: No rhonchi no rales are noted at this time . Abdomen: soft . Skin: no rash seen on limited exam . Musculoskeletal: not rigid . Psychiatric:unable to assess . Neurologic: no seizure no involuntary movements         Lab Data:   Basic Metabolic Panel: Recent Labs  Lab 02/08/20 0509 02/09/20 0652 02/11/20 0548 02/14/20 0652  NA  --  139 139 139  K 3.4* 3.8 4.0 4.4  CL  --  99 97* 101  CO2  --  31 28 27   GLUCOSE  --  157* 181* 236*  BUN  --  66* 63* 82*  CREATININE  --  3.37* 2.99* 3.50*  CALCIUM  --  8.8* 9.1 8.7*  PHOS  --  3.2 2.6 2.0*    ABG: Recent Labs  Lab 02/08/20 1238 02/08/20 1445  PHART 7.370 7.397  PCO2ART 54.7* 49.9*  PO2ART 51.1* 106  HCO3 31.0* 30.1*  O2SAT 86.1 99.4    Liver Function Tests: Recent Labs  Lab 02/09/20 0652 02/11/20 0548 02/14/20 0652  ALBUMIN 2.8* 2.8* 2.3*   No results for input(s): LIPASE, AMYLASE in the last 168 hours. No results for input(s): AMMONIA in the last 168 hours.  CBC: Recent Labs  Lab 02/09/20 0652 02/11/20 0548 02/14/20 0652  WBC 13.4* 11.7*  12.1*  HGB 9.0* 8.8* 8.1*  HCT 30.9* 30.7* 28.1*  MCV 105.1* 103.7* 101.1*  PLT 45* 47* 41*    Cardiac Enzymes: No results for input(s): CKTOTAL, CKMB, CKMBINDEX, TROPONINI in the last 168 hours.  BNP (last 3 results) No results for input(s): BNP in the last 8760 hours.  ProBNP (last 3 results) No results for input(s): PROBNP in the last 8760 hours.  Radiological Exams: DG CHEST PORT 1 VIEW  Result Date: 02/14/2020 CLINICAL DATA:  Respiratory failure. EXAM: PORTABLE CHEST 1 VIEW COMPARISON:  Chest x-ray from yesterday. FINDINGS: Unchanged tracheostomy tube and tunneled right subclavian dialysis catheter. Unchanged left internal jugular central venous catheter. Stable cardiomegaly. New diffuse interstitial thickening. Decreasing left pleural effusion improved aeration of the left upper lobe. Persistent retrocardiac left lower lobe consolidation. Unchanged consolidation at the right lung base and small right pleural effusion. Decreasing small lateral pneumothorax on the right. No acute osseous abnormality. IMPRESSION: 1. Decreasing small lateral pneumothorax on the right. 2. Decreasing left pleural effusion with improved aeration of the left upper lobe. Persistent left lower lobe atelectasis/consolidation. 3. Unchanged consolidation at the right lung base and small right pleural effusion. 4. New interstitial pulmonary edema. Electronically Signed   By: Titus Dubin M.D.   On: 02/14/2020 07:46   DG Chest  Port 1 View  Result Date: 02/13/2020 CLINICAL DATA:  Respiratory failure EXAM: PORTABLE CHEST 1 VIEW COMPARISON:  Feb 12, 2020 FINDINGS: Tracheostomy catheter tip is 5.5 cm above the carina. Dual lumen central catheter tip is in the superior vena cava near the cavoatrial junction. Left jugular catheter tip is in the left innominate vein. There is a lateral pneumothorax on the right, without tension component. There is extensive airspace consolidation throughout the left lung with moderate left  pleural effusion. There is consolidation in the right lower lung region as well. There is stable cardiomegaly. The pulmonary vascularity is normal. No adenopathy. IMPRESSION: Tube and catheter positions as described. There is now a lateral pneumothorax on the right without tension component. There is new extensive airspace consolidation throughout the left lung with left pleural effusion. Consolidation persists in the right base. There is stable cardiomegaly. Critical Value/emergent results were called by telephone at the time of interpretation on 02/13/2020 at 8:34 am to provider Janora Norlander, who verbally acknowledged these results. Electronically Signed   By: Lowella Grip III M.D.   On: 02/13/2020 08:34    Assessment/Plan Active Problems:   Acute on chronic respiratory failure with hypoxia (HCC)   Chronic systolic (congestive) heart failure (HCC)   End stage renal disease on dialysis [facility])   Chronic atrial fibrillation (HCC)   Bilateral pleural effusion   1. Acute on chronic respiratory failure with hypoxia continue with assist control titrate oxygen continue pulmonary toilet.  Patient was supposed to have chest tube placed yesterday however did not we are monitoring clinically. 2. Chronic systolic heart failure right now is compensated we will continue with supportive care 3. End-stage renal failure on hemodialysis continue with nephrology recommendations. 4. Chronic atrial fibrillation rate is controlled 5. Bilateral effusions monitoring x-rays   I have personally seen and evaluated the patient, evaluated laboratory and imaging results, formulated the assessment and plan and placed orders. The Patient requires high complexity decision making with multiple systems involvement.  Rounds were done with the Respiratory Therapy Director and Staff therapists and discussed with nursing staff also.  Allyne Gee, MD [facility] Pulmonary Critical Care Medicine Sleep Medicine

## 2020-02-14 NOTE — Progress Notes (Signed)
[facility]  ROUNDING NOTE   Subjective:  Patient due for dialysis treatment today. Reports a dry mouth. Still on the ventilator.  Objective:  Vital signs in last 24 hours:  Temperature 96.6 pulse 70 respirations 37 blood pressure 103/53  Physical Exam: General: Critically ill-appearing  Head: Normocephalic, atraumatic. Dry oral mucosal membranes  Eyes: Anicteric  Neck: Tracheostomy in place  Lungs:  Scattered rhonchi bilateral, vent assisted  Heart: S1S2 irregular  Abdomen:  Soft, nontender, bowel sounds present  Extremities: Trace peripheral edema.  Neurologic: Awake, alert, following commands  Skin: Bilateral upper extremity ecchymoses  Access: Right IJ PermCath    Basic Metabolic Panel: Recent Labs  Lab 02/08/20 0509 02/09/20 0652 02/11/20 0548  NA  --  139 139  K 3.4* 3.8 4.0  CL  --  99 97*  CO2  --  31 28  GLUCOSE  --  157* 181*  BUN  --  66* 63*  CREATININE  --  3.37* 2.99*  CALCIUM  --  8.8* 9.1  PHOS  --  3.2 2.6    Liver Function Tests: Recent Labs  Lab 02/09/20 0652 02/11/20 0548  ALBUMIN 2.8* 2.8*   No results for input(s): LIPASE, AMYLASE in the last 168 hours. No results for input(s): AMMONIA in the last 168 hours.  CBC: Recent Labs  Lab 02/07/20 0930 02/09/20 0652 02/11/20 0548 02/14/20 0652  WBC 13.1* 13.4* 11.7* 12.1*  HGB 9.0* 9.0* 8.8* 8.1*  HCT 31.2* 30.9* 30.7* 28.1*  MCV 105.1* 105.1* 103.7* 101.1*  PLT 64* 45* 47* 41*    Cardiac Enzymes: No results for input(s): CKTOTAL, CKMB, CKMBINDEX, TROPONINI in the last 168 hours.  BNP: Invalid input(s): POCBNP  CBG: No results for input(s): GLUCAP in the last 168 hours.  Microbiology: Results for orders placed or performed during the hospital encounter of 01/03/2020  C difficile quick scan w PCR reflex     Status: None   Collection Time: 12/18/19  4:49 AM   Specimen: STOOL  Result Value Ref Range Status   C Diff antigen NEGATIVE NEGATIVE Final   C Diff toxin  NEGATIVE NEGATIVE Final   C Diff interpretation No C. difficile detected.  Corrected    Comment: Performed at [facility], [place] [address] [place], [place] [address] CORRECTED ON 03/13 AT 1510: PREVIOUSLY REPORTED AS VALID   Culture, respiratory (non-expectorated)     Status: None   Collection Time: 12/24/19  4:00 PM   Specimen: Tracheal Aspirate; Respiratory  Result Value Ref Range Status   Specimen Description TRACHEAL ASPIRATE  Final   Special Requests NONE  Final   Gram Stain   Final    ABUNDANT WBC PRESENT, PREDOMINANTLY PMN ABUNDANT GRAM POSITIVE RODS FEW GRAM NEGATIVE RODS Performed at [facility], [place] [address] [place],  [address]    Culture MODERATE PROTEUS MIRABILIS  Final   Report Status 12/26/2019 FINAL  Final   Organism ID, Bacteria PROTEUS MIRABILIS  Final      Susceptibility   Proteus mirabilis - MIC*    AMPICILLIN <=2 SENSITIVE Sensitive     CEFAZOLIN 8 SENSITIVE Sensitive     CEFEPIME <=0.12 SENSITIVE Sensitive     CEFTAZIDIME <=1 SENSITIVE Sensitive     CEFTRIAXONE <=0.25 SENSITIVE Sensitive     CIPROFLOXACIN <=0.25 SENSITIVE Sensitive     GENTAMICIN <=1 SENSITIVE Sensitive     IMIPENEM 4 SENSITIVE Sensitive     TRIMETH/SULFA <=20 SENSITIVE Sensitive     AMPICILLIN/SULBACTAM <=  2 SENSITIVE Sensitive     PIP/TAZO <=4 SENSITIVE Sensitive     * MODERATE PROTEUS MIRABILIS  Culture, blood (routine x 2)     Status: None   Collection Time: 12/25/19  5:16 PM   Specimen: BLOOD  Result Value Ref Range Status   Specimen Description BLOOD RIGHT ANTECUBITAL  Final   Special Requests   Final    BOTTLES DRAWN AEROBIC AND ANAEROBIC Blood Culture adequate volume   Culture   Final    NO GROWTH 5 DAYS Performed at [facility], [place] [address] [place], [place] [address]    Report Status 12/30/2019 FINAL  Final  Culture, blood (routine x 2)     Status: None   Collection Time: 12/25/19  5:18 PM   Specimen: BLOOD  Result Value Ref Range Status    Specimen Description BLOOD RIGHT ANTECUBITAL  Final   Special Requests   Final    BOTTLES DRAWN AEROBIC AND ANAEROBIC Blood Culture adequate volume   Culture   Final    NO GROWTH 5 DAYS Performed at [facility], [place] [address] [place], [place] [address]    Report Status 12/30/2019 FINAL  Final  C difficile quick scan w PCR reflex     Status: Abnormal   Collection Time: 12/26/19 12:11 PM   Specimen: STOOL  Result Value Ref Range Status   C Diff antigen POSITIVE (A) NEGATIVE Final   C Diff toxin POSITIVE (A) NEGATIVE Final    Comment: CRITICAL RESULT CALLED TO, READ BACK BY AND VERIFIED WITH:  RN CHRISSY R. @1817  12/26/2019 AKT    C Diff interpretation Toxin producing C. difficile detected.  Final  Culture, blood (routine x 2)     Status: None   Collection Time: 01/05/20 12:43 PM   Specimen: BLOOD  Result Value Ref Range Status   Specimen Description BLOOD RIGHT ANTECUBITAL  Final   Special Requests   Final    BOTTLES DRAWN AEROBIC AND ANAEROBIC Blood Culture adequate volume   Culture   Final    NO GROWTH 5 DAYS Performed at [facility], [address] [place], [place] [address]    Report Status 01/10/2020 FINAL  Final  Culture, respiratory (non-expectorated)     Status: None   Collection Time: 01/05/20  2:55 PM   Specimen: Tracheal Aspirate; Respiratory  Result Value Ref Range Status   Specimen Description TRACHEAL ASPIRATE  Final   Special Requests NONE  Final   Gram Stain   Final    ABUNDANT WBC PRESENT,BOTH PMN AND MONONUCLEAR ABUNDANT GRAM NEGATIVE RODS Performed at [facility], [place] [address], [place], [place] [address]    Culture   Final    ABUNDANT KLEBSIELLA PNEUMONIAE MODERATE PROTEUS MIRABILIS KLEBSIELLA PNEUMONIAE CONFIRMED CARBAPENEMASE RESISTANT ENTEROBACTERIACAE KLEBSIELLA PNEUMONIAE Confirmed Extended Spectrum Beta-Lactamase Producer (ESBL).  In bloodstream infections from ESBL organisms, carbapenems are preferred over  piperacillin/tazobactam. They are shown to have a lower risk of mortality. KLEBSIELLA PNEUMONIAE MULTI-DRUG RESISTANT ORGANISM    Report Status 01/11/2020 FINAL  Final   Organism ID, Bacteria PROTEUS MIRABILIS  Final   Organism ID, Bacteria KLEBSIELLA PNEUMONIAE  Final      Susceptibility   Klebsiella pneumoniae - MIC*    AMPICILLIN >=32 RESISTANT Resistant     CEFAZOLIN >=64 RESISTANT Resistant     CEFEPIME >=32 RESISTANT Resistant     CEFTAZIDIME >=64 RESISTANT Resistant     CEFTRIAXONE >=64 RESISTANT Resistant     CIPROFLOXACIN <=  0.25 SENSITIVE Sensitive     GENTAMICIN >=16 RESISTANT Resistant     IMIPENEM >=16 RESISTANT Resistant     TRIMETH/SULFA >=320 RESISTANT Resistant     AMPICILLIN/SULBACTAM >=32 RESISTANT Resistant     PIP/TAZO >=128 RESISTANT Resistant     * ABUNDANT KLEBSIELLA PNEUMONIAE   Proteus mirabilis - MIC*    AMPICILLIN <=2 SENSITIVE Sensitive     CEFAZOLIN <=4 SENSITIVE Sensitive     CEFEPIME <=0.12 SENSITIVE Sensitive     CEFTAZIDIME <=1 SENSITIVE Sensitive     CEFTRIAXONE <=0.25 SENSITIVE Sensitive     CIPROFLOXACIN <=0.25 SENSITIVE Sensitive     GENTAMICIN <=1 SENSITIVE Sensitive     IMIPENEM 8 INTERMEDIATE Intermediate     TRIMETH/SULFA <=20 SENSITIVE Sensitive     AMPICILLIN/SULBACTAM <=2 SENSITIVE Sensitive     PIP/TAZO <=4 SENSITIVE Sensitive     * MODERATE PROTEUS MIRABILIS  Culture, blood (routine x 2)     Status: None   Collection Time: 01/05/20  3:04 PM   Specimen: BLOOD  Result Value Ref Range Status   Specimen Description BLOOD RIGHT ANTECUBITAL  Final   Special Requests   Final    BOTTLES DRAWN AEROBIC ONLY Blood Culture adequate volume   Culture   Final    NO GROWTH 5 DAYS Performed at [facility], [address], [place], [place] [address]    Report Status 01/10/2020 FINAL  Final  Carbapenem Resistance Panel     Status: Abnormal   Collection Time: 01/05/20  3:08 PM  Result Value Ref Range Status   Carba Resistance IMP Gene  NOT DETECTED NOT DETECTED Final   Carba Resistance VIM Gene NOT DETECTED NOT DETECTED Final   Carba Resistance NDM Gene NOT DETECTED NOT DETECTED Final   Carba Resistance KPC Gene DETECTED (A) NOT DETECTED Final    Comment: CRITICAL RESULT CALLED TO, READ BACK BY AND VERIFIED WITH: RBV RN D. Fara Olden [id number] FCP    Carba Resistance OXA48 Gene NOT DETECTED NOT DETECTED Final    Comment: (NOTE) Cepheid Carba-R is an FDA-cleared nucleic acid amplification test  (NAAT)for the detection and differentiation of genes encoding the  most prevalent carbapenemases in bacterial isolate samples. Carbapenemase gene identification and implementation of comprehensive  infection control measures are recommended by the CDC to prevent the  spread of the resistant organisms. Performed at [facility], [place] [address], [place], [place] [address]   SARS CORONAVIRUS 2 (TAT 6-24 HRS) Nasopharyngeal Nasopharyngeal Swab     Status: None   Collection Time: 02/10/20  3:46 PM   Specimen: Nasopharyngeal Swab  Result Value Ref Range Status   SARS Coronavirus 2 NEGATIVE NEGATIVE Final    Comment: (NOTE) SARS-CoV-2 target nucleic acids are NOT DETECTED. The SARS-CoV-2 RNA is generally detectable in upper and lower respiratory specimens during the acute phase of infection. Negative results do not preclude SARS-CoV-2 infection, do not rule out co-infections with other pathogens, and should not be used as the sole basis for treatment or other patient management decisions. Negative results must be combined with clinical observations, patient history, and epidemiological information. The expected result is Negative. Fact Sheet for Patients: SugarRoll.be Fact Sheet for Healthcare Providers: https://www.woods-mathews.com/ This test is not yet approved or cleared by the [place] FDA and  has been authorized for detection and/or diagnosis of SARS-CoV-2 by FDA under  an Emergency Use Authorization (EUA). This EUA will remain  in effect (meaning this test can be used) for the duration of  the COVID-19 declaration under Section 56 4(b)(1) of the Act, 21 U.S.C. section 360bbb-3(b)(1), unless the authorization is terminated or revoked sooner. Performed at [facility], [place] [address], [place], [place] [address]   Culture, respiratory (non-expectorated)     Status: None (Preliminary result)   Collection Time: 02/13/20 11:24 AM   Specimen: Tracheal Aspirate; Respiratory  Result Value Ref Range Status   Specimen Description TRACHEAL ASPIRATE  Final   Special Requests NONE  Final   Gram Stain   Final    RARE WBC PRESENT, PREDOMINANTLY PMN MODERATE GRAM VARIABLE ROD Performed at [facility], [place] [address], [place], [place] [address]    Culture PENDING  Incomplete   Report Status PENDING  Incomplete    Coagulation Studies: No results for input(s): LABPROT, INR in the last 72 hours.  Urinalysis: No results for input(s): COLORURINE, LABSPEC, PHURINE, GLUCOSEU, HGBUR, BILIRUBINUR, KETONESUR, PROTEINUR, UROBILINOGEN, NITRITE, LEUKOCYTESUR in the last 72 hours.  Invalid input(s): APPERANCEUR    Imaging: DG CHEST PORT 1 VIEW  Result Date: 02/14/2020 CLINICAL DATA:  Respiratory failure. EXAM: PORTABLE CHEST 1 VIEW COMPARISON:  Chest x-ray from yesterday. FINDINGS: Unchanged tracheostomy tube and tunneled right subclavian dialysis catheter. Unchanged left internal jugular central venous catheter. Stable cardiomegaly. New diffuse interstitial thickening. Decreasing left pleural effusion improved aeration of the left upper lobe. Persistent retrocardiac left lower lobe consolidation. Unchanged consolidation at the right lung base and small right pleural effusion. Decreasing small lateral pneumothorax on the right. No acute osseous abnormality. IMPRESSION: 1. Decreasing small lateral pneumothorax on the right. 2. Decreasing left pleural effusion with  improved aeration of the left upper lobe. Persistent left lower lobe atelectasis/consolidation. 3. Unchanged consolidation at the right lung base and small right pleural effusion. 4. New interstitial pulmonary edema. Electronically Signed   By: Titus Dubin M.D.   On: 02/14/2020 07:46   DG Chest Port 1 View  Result Date: 02/13/2020 CLINICAL DATA:  Respiratory failure EXAM: PORTABLE CHEST 1 VIEW COMPARISON:  Feb 12, 2020 FINDINGS: Tracheostomy catheter tip is 5.5 cm above the carina. Dual lumen central catheter tip is in the superior vena cava near the cavoatrial junction. Left jugular catheter tip is in the left innominate vein. There is a lateral pneumothorax on the right, without tension component. There is extensive airspace consolidation throughout the left lung with moderate left pleural effusion. There is consolidation in the right lower lung region as well. There is stable cardiomegaly. The pulmonary vascularity is normal. No adenopathy. IMPRESSION: Tube and catheter positions as described. There is now a lateral pneumothorax on the right without tension component. There is new extensive airspace consolidation throughout the left lung with left pleural effusion. Consolidation persists in the right base. There is stable cardiomegaly. Critical Value/emergent results were called by telephone at the time of interpretation on 02/13/2020 at 8:34 am to provider Janora Norlander, who verbally acknowledged these results. Electronically Signed   By: Lowella Grip III M.D.   On: 02/13/2020 08:34     Medications:     iohexol, lidocaine (PF)  Assessment/ Plan:  69 y.o. male with a PMHx of ESRD on HD, CHF, aortic stenosis, hypertension, diabetes mellitus type 2, hyperlipidemia, obstructive sleep apnea, chronic venous stasis with LE edema, and obesity who was admitted to [facility] on 12/17/2019 for ongoing treatment of respiratory failure, ESRD, malnutrition, and generalized debility.    1.  ESRD on HD MWF.     Patient due for dialysis treatment today per usual schedule.  We will plan to complete dialysis treatment and maintain him on dialysis on MWF schedule.  2.  Acute respiratory failure.    Patient continues to be ventilator dependent at this time.  Pulmonary/critical care following.  3.  Secondary hyperparathyroidism.    Continue periodically monitor serum phosphorus and bili mineral metabolism parameters.  4.  Anemia of chronic kidney disease.  Hemoglobin currently 8.1.  Continue Retacrit [id number] with dialysis.    LOS: 0 Jacorie Ernsberger 5/10/20218:49 AM

## 2020-02-15 ENCOUNTER — Other Ambulatory Visit (HOSPITAL_COMMUNITY): Payer: Medicare Other

## 2020-02-15 DIAGNOSIS — I482 Chronic atrial fibrillation, unspecified: Secondary | ICD-10-CM | POA: Diagnosis not present

## 2020-02-15 DIAGNOSIS — J90 Pleural effusion, not elsewhere classified: Secondary | ICD-10-CM | POA: Diagnosis not present

## 2020-02-15 DIAGNOSIS — I5022 Chronic systolic (congestive) heart failure: Secondary | ICD-10-CM | POA: Diagnosis not present

## 2020-02-15 DIAGNOSIS — J9621 Acute and chronic respiratory failure with hypoxia: Secondary | ICD-10-CM | POA: Diagnosis not present

## 2020-02-15 NOTE — Progress Notes (Signed)
[facility]   PULMONARY CRITICAL CARE SERVICE  PROGRESS NOTE  Date of Service: 02/15/2020  Jex Strausbaugh  KLK:[id number]  DOB: May 24, 1951   DOA: 01/03/2020  Referring Physician: Merton Border, MD  HPI: Desiderio Dolata is a 69 y.o. male seen for follow up of Acute on Chronic Respiratory Failure.  Patient currently is on pressure support mode back of full support right now currently requiring 45% FiO2 good volumes are noted on the pressure support spoke with respiratory therapy trying once again on T collar  Medications: Reviewed on Rounds  Physical Exam:  Vitals: Temperature is 96.7 pulse 78 respiratory rate 24 blood pressure is 121/59 saturations 100%  Ventilator Settings on pressure support FiO2 45% pressure support 12 PEEP 8 tidal volume 650  . General: Comfortable at this time . Eyes: Grossly normal lids, irises & conjunctiva . ENT: grossly tongue is normal . Neck: no obvious mass . Cardiovascular: S1 S2 normal no gallop . Respiratory: No rhonchi coarse breath sounds noted . Abdomen: soft . Skin: no rash seen on limited exam . Musculoskeletal: not rigid . Psychiatric:unable to assess . Neurologic: no seizure no involuntary movements         Lab Data:   Basic Metabolic Panel: Recent Labs  Lab 02/09/20 0652 02/11/20 0548 02/14/20 0652  NA 139 139 139  K 3.8 4.0 4.4  CL 99 97* 101  CO2 31 28 27   GLUCOSE 157* 181* 236*  BUN 66* 63* 82*  CREATININE 3.37* 2.99* 3.50*  CALCIUM 8.8* 9.1 8.7*  PHOS 3.2 2.6 2.0*    ABG: Recent Labs  Lab 02/08/20 1238 02/08/20 1445  PHART 7.370 7.397  PCO2ART 54.7* 49.9*  PO2ART 51.1* 106  HCO3 31.0* 30.1*  O2SAT 86.1 99.4    Liver Function Tests: Recent Labs  Lab 02/09/20 0652 02/11/20 0548 02/14/20 0652  ALBUMIN 2.8* 2.8* 2.3*   No results for input(s): LIPASE, AMYLASE in the last 168 hours. No results for input(s): AMMONIA in the last 168 hours.  CBC: Recent Labs   Lab 02/09/20 0652 02/11/20 0548 02/14/20 0652  WBC 13.4* 11.7* 12.1*  HGB 9.0* 8.8* 8.1*  HCT 30.9* 30.7* 28.1*  MCV 105.1* 103.7* 101.1*  PLT 45* 47* 41*    Cardiac Enzymes: No results for input(s): CKTOTAL, CKMB, CKMBINDEX, TROPONINI in the last 168 hours.  BNP (last 3 results) No results for input(s): BNP in the last 8760 hours.  ProBNP (last 3 results) No results for input(s): PROBNP in the last 8760 hours.  Radiological Exams: DG Chest Port 1 View  Result Date: 02/15/2020 CLINICAL DATA:  Pneumothorax EXAM: PORTABLE CHEST 1 VIEW COMPARISON:  Yesterday FINDINGS: Tracheostomy tube in place. Bilateral central line with tips in unchanged position. The left-sided catheter is shorter than typical, with tip near the lower IJ. Cardiomegaly and hazy pulmonary opacity with vascular congestion and cephalized below. No pneumothorax. IMPRESSION: 1. Stable hardware positioning. 2. Cardiomegaly with edema and pleural fluid. Pneumonia may be superimposed. Electronically Signed   By: Monte Fantasia M.D.   On: 02/15/2020 06:33   DG CHEST PORT 1 VIEW  Result Date: 02/14/2020 CLINICAL DATA:  Respiratory failure. EXAM: PORTABLE CHEST 1 VIEW COMPARISON:  Chest x-ray from yesterday. FINDINGS: Unchanged tracheostomy tube and tunneled right subclavian dialysis catheter. Unchanged left internal jugular central venous catheter. Stable cardiomegaly. New diffuse interstitial thickening. Decreasing left pleural effusion improved aeration of the left upper lobe. Persistent retrocardiac left lower lobe consolidation. Unchanged consolidation at the right lung  base and small right pleural effusion. Decreasing small lateral pneumothorax on the right. No acute osseous abnormality. IMPRESSION: 1. Decreasing small lateral pneumothorax on the right. 2. Decreasing left pleural effusion with improved aeration of the left upper lobe. Persistent left lower lobe atelectasis/consolidation. 3. Unchanged consolidation at the  right lung base and small right pleural effusion. 4. New interstitial pulmonary edema. Electronically Signed   By: Titus Dubin M.D.   On: 02/14/2020 07:46    Assessment/Plan Active Problems:   Acute on chronic respiratory failure with hypoxia (HCC)   Chronic systolic (congestive) heart failure (HCC)   End stage renal disease on dialysis [facility])   Chronic atrial fibrillation (HCC)   Bilateral pleural effusion   1. Acute on chronic respiratory failure with hypoxia we will continue to wean on pressure support as tolerated titrate oxygen continue pulmonary toilet.  As already noted above we will have respiratory therapy assess for T collar once again 2. Chronic systolic heart failure compensated monitor fluid status 3. End-stage renal failure on hemodialysis being followed by nephrology 4. Chronic atrial fibrillation rate is controlled 5. Bilateral pleural effusions no change we will continue with supportive care   I have personally seen and evaluated the patient, evaluated laboratory and imaging results, formulated the assessment and plan and placed orders. The Patient requires high complexity decision making with multiple systems involvement.  Rounds were done with the Respiratory Therapy Director and Staff therapists and discussed with nursing staff also.  Allyne Gee, MD [facility] Pulmonary Critical Care Medicine Sleep Medicine

## 2020-02-16 DIAGNOSIS — J90 Pleural effusion, not elsewhere classified: Secondary | ICD-10-CM | POA: Diagnosis not present

## 2020-02-16 DIAGNOSIS — J9621 Acute and chronic respiratory failure with hypoxia: Secondary | ICD-10-CM | POA: Diagnosis not present

## 2020-02-16 DIAGNOSIS — I5022 Chronic systolic (congestive) heart failure: Secondary | ICD-10-CM | POA: Diagnosis not present

## 2020-02-16 DIAGNOSIS — I482 Chronic atrial fibrillation, unspecified: Secondary | ICD-10-CM | POA: Diagnosis not present

## 2020-02-16 LAB — RENAL FUNCTION PANEL
Albumin: 2.3 g/dL — ABNORMAL LOW (ref 3.5–5.0)
Anion gap: 10 (ref 5–15)
BUN: 85 mg/dL — ABNORMAL HIGH (ref 8–23)
CO2: 29 mmol/L (ref 22–32)
Calcium: 8.9 mg/dL (ref 8.9–10.3)
Chloride: 100 mmol/L (ref 98–111)
Creatinine, Ser: 3.1 mg/dL — ABNORMAL HIGH (ref 0.61–1.24)
GFR calc Af Amer: 23 mL/min — ABNORMAL LOW (ref >60)
GFR calc non Af Amer: 20 mL/min — ABNORMAL LOW (ref >60)
Glucose, Bld: 246 mg/dL — ABNORMAL HIGH (ref 70–99)
Phosphorus: 2.3 mg/dL — ABNORMAL LOW (ref 2.5–4.6)
Potassium: 5.1 mmol/L (ref 3.5–5.1)
Sodium: 139 mmol/L (ref 135–145)

## 2020-02-16 LAB — CBC
HCT: 27.7 % — ABNORMAL LOW (ref 39.0–52.0)
Hemoglobin: 8 g/dL — ABNORMAL LOW (ref 13.0–17.0)
MCH: 29.3 pg (ref 26.0–34.0)
MCHC: 28.9 g/dL — ABNORMAL LOW (ref 30.0–36.0)
MCV: 101.5 fL — ABNORMAL HIGH (ref 80.0–100.0)
Platelets: 36 10*3/uL — ABNORMAL LOW (ref 150–400)
RBC: 2.73 MIL/uL — ABNORMAL LOW (ref 4.22–5.81)
RDW: 18.9 % — ABNORMAL HIGH (ref 11.5–15.5)
WBC: 13.9 10*3/uL — ABNORMAL HIGH (ref 4.0–10.5)
nRBC: 0 % (ref 0.0–0.2)

## 2020-02-16 LAB — CULTURE, RESPIRATORY W GRAM STAIN: Culture: NORMAL

## 2020-02-16 NOTE — Progress Notes (Signed)
[facility]  ROUNDING NOTE   Subjective:  Patient resting comfortably in bed at the moment. Due for hemodialysis treatment today.  Objective:  Vital signs in last 24 hours:  Temperature 96.6 pulse 68 respirations 23 blood pressure 151/74  Physical Exam: General: Critically ill-appearing  Head: Normocephalic, atraumatic. Dry oral mucosal membranes  Eyes: Anicteric  Neck: Tracheostomy in place  Lungs:  Scattered rhonchi bilateral, vent assisted  Heart: S1S2 irregular  Abdomen:  Soft, nontender, bowel sounds present  Extremities: Trace peripheral edema.  Neurologic: Awake, alert, following commands  Skin: Bilateral upper extremity ecchymoses  Access: Right IJ PermCath    Basic Metabolic Panel: Recent Labs  Lab 02/11/20 0548 02/14/20 0652  NA 139 139  K 4.0 4.4  CL 97* 101  CO2 28 27  GLUCOSE 181* 236*  BUN 63* 82*  CREATININE 2.99* 3.50*  CALCIUM 9.1 8.7*  PHOS 2.6 2.0*    Liver Function Tests: Recent Labs  Lab 02/11/20 0548 02/14/20 0652  ALBUMIN 2.8* 2.3*   No results for input(s): LIPASE, AMYLASE in the last 168 hours. No results for input(s): AMMONIA in the last 168 hours.  CBC: Recent Labs  Lab 02/11/20 0548 02/14/20 0652 02/16/20 0741  WBC 11.7* 12.1* 13.9*  HGB 8.8* 8.1* 8.0*  HCT 30.7* 28.1* 27.7*  MCV 103.7* 101.1* 101.5*  PLT 47* 41* 36*    Cardiac Enzymes: No results for input(s): CKTOTAL, CKMB, CKMBINDEX, TROPONINI in the last 168 hours.  BNP: Invalid input(s): POCBNP  CBG: No results for input(s): GLUCAP in the last 168 hours.  Microbiology: Results for orders placed or performed during the hospital encounter of 12/12/2019  C difficile quick scan w PCR reflex     Status: None   Collection Time: 12/18/19  4:49 AM   Specimen: STOOL  Result Value Ref Range Status   C Diff antigen NEGATIVE NEGATIVE Final   C Diff toxin NEGATIVE NEGATIVE Final   C Diff interpretation No C. difficile detected.  Corrected    Comment:  Performed at [facility], [place] [address], [place], [place] [address] CORRECTED ON 03/13 AT 1510: PREVIOUSLY REPORTED AS VALID   Culture, respiratory (non-expectorated)     Status: None   Collection Time: 12/24/19  4:00 PM   Specimen: Tracheal Aspirate; Respiratory  Result Value Ref Range Status   Specimen Description TRACHEAL ASPIRATE  Final   Special Requests NONE  Final   Gram Stain   Final    ABUNDANT WBC PRESENT, PREDOMINANTLY PMN ABUNDANT GRAM POSITIVE RODS FEW GRAM NEGATIVE RODS Performed at [facility], [place] [address], [place], [place] [address]    Culture MODERATE PROTEUS MIRABILIS  Final   Report Status 12/26/2019 FINAL  Final   Organism ID, Bacteria PROTEUS MIRABILIS  Final      Susceptibility   Proteus mirabilis - MIC*    AMPICILLIN <=2 SENSITIVE Sensitive     CEFAZOLIN 8 SENSITIVE Sensitive     CEFEPIME <=0.12 SENSITIVE Sensitive     CEFTAZIDIME <=1 SENSITIVE Sensitive     CEFTRIAXONE <=0.25 SENSITIVE Sensitive     CIPROFLOXACIN <=0.25 SENSITIVE Sensitive     GENTAMICIN <=1 SENSITIVE Sensitive     IMIPENEM 4 SENSITIVE Sensitive     TRIMETH/SULFA <=20 SENSITIVE Sensitive     AMPICILLIN/SULBACTAM <=2 SENSITIVE Sensitive     PIP/TAZO <=4 SENSITIVE Sensitive     * MODERATE PROTEUS MIRABILIS  Culture, blood (routine x 2)     Status: None   Collection Time:  12/25/19  5:16 PM   Specimen: BLOOD  Result Value Ref Range Status   Specimen Description BLOOD RIGHT ANTECUBITAL  Final   Special Requests   Final    BOTTLES DRAWN AEROBIC AND ANAEROBIC Blood Culture adequate volume   Culture   Final    NO GROWTH 5 DAYS Performed at [facility], [address], [place], [place] [address]    Report Status 12/30/2019 FINAL  Final  Culture, blood (routine x 2)     Status: None   Collection Time: 12/25/19  5:18 PM   Specimen: BLOOD  Result Value Ref Range Status   Specimen Description BLOOD RIGHT ANTECUBITAL  Final   Special Requests   Final    BOTTLES  DRAWN AEROBIC AND ANAEROBIC Blood Culture adequate volume   Culture   Final    NO GROWTH 5 DAYS Performed at [facility], [place] [address], [place], [place] [address]    Report Status 12/30/2019 FINAL  Final  C difficile quick scan w PCR reflex     Status: Abnormal   Collection Time: 12/26/19 12:11 PM   Specimen: STOOL  Result Value Ref Range Status   C Diff antigen POSITIVE (A) NEGATIVE Final   C Diff toxin POSITIVE (A) NEGATIVE Final    Comment: CRITICAL RESULT CALLED TO, READ BACK BY AND VERIFIED WITH:  RN CHRISSY R. @1817  12/26/2019 AKT    C Diff interpretation Toxin producing C. difficile detected.  Final  Culture, blood (routine x 2)     Status: None   Collection Time: 01/05/20 12:43 PM   Specimen: BLOOD  Result Value Ref Range Status   Specimen Description BLOOD RIGHT ANTECUBITAL  Final   Special Requests   Final    BOTTLES DRAWN AEROBIC AND ANAEROBIC Blood Culture adequate volume   Culture   Final    NO GROWTH 5 DAYS Performed at [facility], [address], [place], [place] [address]    Report Status 01/10/2020 FINAL  Final  Culture, respiratory (non-expectorated)     Status: None   Collection Time: 01/05/20  2:55 PM   Specimen: Tracheal Aspirate; Respiratory  Result Value Ref Range Status   Specimen Description TRACHEAL ASPIRATE  Final   Special Requests NONE  Final   Gram Stain   Final    ABUNDANT WBC PRESENT,BOTH PMN AND MONONUCLEAR ABUNDANT GRAM NEGATIVE RODS Performed at [facility], [place] [address] [place], [place] [address]    Culture   Final    ABUNDANT KLEBSIELLA PNEUMONIAE MODERATE PROTEUS MIRABILIS KLEBSIELLA PNEUMONIAE CONFIRMED CARBAPENEMASE RESISTANT ENTEROBACTERIACAE KLEBSIELLA PNEUMONIAE Confirmed Extended Spectrum Beta-Lactamase Producer (ESBL).  In bloodstream infections from ESBL organisms, carbapenems are preferred over piperacillin/tazobactam. They are shown to have a lower risk of mortality. KLEBSIELLA PNEUMONIAE MULTI-DRUG  RESISTANT ORGANISM    Report Status 01/11/2020 FINAL  Final   Organism ID, Bacteria PROTEUS MIRABILIS  Final   Organism ID, Bacteria KLEBSIELLA PNEUMONIAE  Final      Susceptibility   Klebsiella pneumoniae - MIC*    AMPICILLIN >=32 RESISTANT Resistant     CEFAZOLIN >=64 RESISTANT Resistant     CEFEPIME >=32 RESISTANT Resistant     CEFTAZIDIME >=64 RESISTANT Resistant     CEFTRIAXONE >=64 RESISTANT Resistant     CIPROFLOXACIN <=0.25 SENSITIVE Sensitive     GENTAMICIN >=16 RESISTANT Resistant     IMIPENEM >=16 RESISTANT Resistant     TRIMETH/SULFA >=320 RESISTANT Resistant     AMPICILLIN/SULBACTAM >=32 RESISTANT Resistant  PIP/TAZO >=128 RESISTANT Resistant     * ABUNDANT KLEBSIELLA PNEUMONIAE   Proteus mirabilis - MIC*    AMPICILLIN <=2 SENSITIVE Sensitive     CEFAZOLIN <=4 SENSITIVE Sensitive     CEFEPIME <=0.12 SENSITIVE Sensitive     CEFTAZIDIME <=1 SENSITIVE Sensitive     CEFTRIAXONE <=0.25 SENSITIVE Sensitive     CIPROFLOXACIN <=0.25 SENSITIVE Sensitive     GENTAMICIN <=1 SENSITIVE Sensitive     IMIPENEM 8 INTERMEDIATE Intermediate     TRIMETH/SULFA <=20 SENSITIVE Sensitive     AMPICILLIN/SULBACTAM <=2 SENSITIVE Sensitive     PIP/TAZO <=4 SENSITIVE Sensitive     * MODERATE PROTEUS MIRABILIS  Culture, blood (routine x 2)     Status: None   Collection Time: 01/05/20  3:04 PM   Specimen: BLOOD  Result Value Ref Range Status   Specimen Description BLOOD RIGHT ANTECUBITAL  Final   Special Requests   Final    BOTTLES DRAWN AEROBIC ONLY Blood Culture adequate volume   Culture   Final    NO GROWTH 5 DAYS Performed at [facility], [place] [address] [place], [place] [address]    Report Status 01/10/2020 FINAL  Final  Carbapenem Resistance Panel     Status: Abnormal   Collection Time: 01/05/20  3:08 PM  Result Value Ref Range Status   Carba Resistance IMP Gene NOT DETECTED NOT DETECTED Final   Carba Resistance VIM Gene NOT DETECTED NOT DETECTED Final   Carba  Resistance NDM Gene NOT DETECTED NOT DETECTED Final   Carba Resistance KPC Gene DETECTED (A) NOT DETECTED Final    Comment: CRITICAL RESULT CALLED TO, READ BACK BY AND VERIFIED WITH: RBV RN D. Fara Olden [id number] FCP    Carba Resistance OXA48 Gene NOT DETECTED NOT DETECTED Final    Comment: (NOTE) Cepheid Carba-R is an FDA-cleared nucleic acid amplification test  (NAAT)for the detection and differentiation of genes encoding the  most prevalent carbapenemases in bacterial isolate samples. Carbapenemase gene identification and implementation of comprehensive  infection control measures are recommended by the CDC to prevent the  spread of the resistant organisms. Performed at [facility], [place] [address], [place], [place] [address]   SARS CORONAVIRUS 2 (TAT 6-24 HRS) Nasopharyngeal Nasopharyngeal Swab     Status: None   Collection Time: 02/10/20  3:46 PM   Specimen: Nasopharyngeal Swab  Result Value Ref Range Status   SARS Coronavirus 2 NEGATIVE NEGATIVE Final    Comment: (NOTE) SARS-CoV-2 target nucleic acids are NOT DETECTED. The SARS-CoV-2 RNA is generally detectable in upper and lower respiratory specimens during the acute phase of infection. Negative results do not preclude SARS-CoV-2 infection, do not rule out co-infections with other pathogens, and should not be used as the sole basis for treatment or other patient management decisions. Negative results must be combined with clinical observations, patient history, and epidemiological information. The expected result is Negative. Fact Sheet for Patients: SugarRoll.be Fact Sheet for Healthcare Providers: https://www.woods-mathews.com/ This test is not yet approved or cleared by the [place] FDA and  has been authorized for detection and/or diagnosis of SARS-CoV-2 by FDA under an Emergency Use Authorization (EUA). This EUA will remain  in effect (meaning this test can be used)  for the duration of the COVID-19 declaration under Section 56 4(b)(1) of the Act, 21 U.S.C. section 360bbb-3(b)(1), unless the authorization is terminated or revoked sooner. Performed at [facility], [place] [address] [place], [place] [address]   Culture, respiratory (  non-expectorated)     Status: None   Collection Time: 02/13/20 11:24 AM   Specimen: Tracheal Aspirate; Respiratory  Result Value Ref Range Status   Specimen Description TRACHEAL ASPIRATE  Final   Special Requests NONE  Final   Gram Stain   Final    RARE WBC PRESENT, PREDOMINANTLY PMN MODERATE GRAM VARIABLE ROD    Culture   Final    ABUNDANT Consistent with normal respiratory flora. Performed at [facility], [place] [address] [place], [place] [address]    Report Status 02/16/2020 FINAL  Final    Coagulation Studies: No results for input(s): LABPROT, INR in the last 72 hours.  Urinalysis: No results for input(s): COLORURINE, LABSPEC, PHURINE, GLUCOSEU, HGBUR, BILIRUBINUR, KETONESUR, PROTEINUR, UROBILINOGEN, NITRITE, LEUKOCYTESUR in the last 72 hours.  Invalid input(s): APPERANCEUR    Imaging: DG Chest Port 1 View  Result Date: 02/15/2020 CLINICAL DATA:  Pneumothorax EXAM: PORTABLE CHEST 1 VIEW COMPARISON:  Yesterday FINDINGS: Tracheostomy tube in place. Bilateral central line with tips in unchanged position. The left-sided catheter is shorter than typical, with tip near the lower IJ. Cardiomegaly and hazy pulmonary opacity with vascular congestion and cephalized below. No pneumothorax. IMPRESSION: 1. Stable hardware positioning. 2. Cardiomegaly with edema and pleural fluid. Pneumonia may be superimposed. Electronically Signed   By: Monte Fantasia M.D.   On: 02/15/2020 06:33     Medications:     iohexol, lidocaine (PF)  Assessment/ Plan:  69 y.o. male with a PMHx of ESRD on HD, CHF, aortic stenosis, hypertension, diabetes mellitus type 2, hyperlipidemia, obstructive sleep apnea, chronic venous  stasis with LE edema, and obesity who was admitted to [facility] on 12/28/2019 for ongoing treatment of respiratory failure, ESRD, malnutrition, and generalized debility.    1.  ESRD on HD MWF.    Patient due for dialysis treatment today.  Orders have been prepared.  2.  Acute respiratory failure.    Patient continues to receive ventilator support.  Has been difficult to wean from the ventilator.  3.  Secondary hyperparathyroidism.    Phosphorus 2.0 at last check.  Continue to periodically monitor calcium and phosphorus.  4.  Anemia of chronic kidney disease.  Hemoglobin currently 8.0.  Maintain the patient on Retacrit 4000 units IV with dialysis.  Platelets also appear to be dropping.  Order heparin antibodies and serotonin release assay.  These were checked previously and were negative.    LOS: 0 Narely Nobles 5/12/20218:55 AM

## 2020-02-16 NOTE — Progress Notes (Signed)
Pulmonary [place]   PULMONARY CRITICAL CARE SERVICE  PROGRESS NOTE  Date of Service: 02/16/2020  Jose Tucker  IOE:[id number]  DOB: [birth year]   DOA: 12/14/2019  Referring Physician: Merton Border, MD  HPI: Jose Tucker is a 69 y.o. male seen for follow up of Acute on Chronic Respiratory Failure.  Patient currently is on pressure support mode is on 40% FiO2 with good saturations right now.  Was attempted on T collar once again however he did not tolerate.  As previously suspected I think his baseline will be on the ventilator with a rate of preferably.  Medications: Reviewed on Rounds  Physical Exam:  Vitals: Temperature 96.6 pulse 68 respiratory 23 blood pressure is 151/74 saturations 100%  Ventilator Settings on pressure support FiO2 40% pressure 12 PEEP 5  . General: Comfortable at this time . Eyes: Grossly normal lids, irises & conjunctiva . ENT: grossly tongue is normal . Neck: no obvious mass . Cardiovascular: S1 S2 normal no gallop . Respiratory: No rhonchi no rales noted at this time . Abdomen: soft . Skin: no rash seen on limited exam . Musculoskeletal: not rigid . Psychiatric:unable to assess . Neurologic: no seizure no involuntary movements         Lab Data:   Basic Metabolic Panel: Recent Labs  Lab 02/11/20 0548 02/14/20 0652 02/16/20 0741  NA 139 139 139  K 4.0 4.4 5.1  CL 97* 101 100  CO2 28 27 29   GLUCOSE 181* 236* 246*  BUN 63* 82* 85*  CREATININE 2.99* 3.50* 3.10*  CALCIUM 9.1 8.7* 8.9  PHOS 2.6 2.0* 2.3*    ABG: No results for input(s): PHART, PCO2ART, PO2ART, HCO3, O2SAT in the last 168 hours.  Liver Function Tests: Recent Labs  Lab 02/11/20 0548 02/14/20 0652 02/16/20 0741  ALBUMIN 2.8* 2.3* 2.3*   No results for input(s): LIPASE, AMYLASE in the last 168 hours. No results for input(s): AMMONIA in the last 168 hours.  CBC: Recent Labs  Lab 02/11/20 0548 02/14/20 0652 02/16/20 0741   WBC 11.7* 12.1* 13.9*  HGB 8.8* 8.1* 8.0*  HCT 30.7* 28.1* 27.7*  MCV 103.7* 101.1* 101.5*  PLT 47* 41* 36*    Cardiac Enzymes: No results for input(s): CKTOTAL, CKMB, CKMBINDEX, TROPONINI in the last 168 hours.  BNP (last 3 results) No results for input(s): BNP in the last 8760 hours.  ProBNP (last 3 results) No results for input(s): PROBNP in the last 8760 hours.  Radiological Exams: DG Chest Port 1 View  Result Date: 02/15/2020 CLINICAL DATA:  Pneumothorax EXAM: PORTABLE CHEST 1 VIEW COMPARISON:  Yesterday FINDINGS: Tracheostomy tube in place. Bilateral central line with tips in unchanged position. The left-sided catheter is shorter than typical, with tip near the lower IJ. Cardiomegaly and hazy pulmonary opacity with vascular congestion and cephalized below. No pneumothorax. IMPRESSION: 1. Stable hardware positioning. 2. Cardiomegaly with edema and pleural fluid. Pneumonia may be superimposed. Electronically Signed   By: Monte Fantasia M.D.   On: 02/15/2020 06:33    Assessment/Plan Active Problems:   Acute on chronic respiratory failure with hypoxia (HCC)   Chronic systolic (congestive) heart failure (HCC)   End stage renal disease on dialysis [facility])   Chronic atrial fibrillation (HCC)   Bilateral pleural effusion   1. Acute on chronic respiratory failure with hypoxia we will continue with pressure support right now is on 40% FiO2 will titrate oxygen as tolerated continue secretion management. 2. Chronic systolic heart failure right now  is compensated we will continue with supportive care patient does get dialysis regularly 3. End-stage renal failure on hemodialysis per nephrology recommendations 4. Chronic atrial fibrillation rate is controlled 5. Bilateral pleural effusion no change we will continue to follow along   I have personally seen and evaluated the patient, evaluated laboratory and imaging results, formulated the assessment and plan and placed orders. The  Patient requires high complexity decision making with multiple systems involvement.  Rounds were done with the Respiratory Therapy Director and Staff therapists and discussed with nursing staff also.  Allyne Gee, MD [facility] Pulmonary Critical Care Medicine Sleep Medicine

## 2020-02-17 DIAGNOSIS — J9621 Acute and chronic respiratory failure with hypoxia: Secondary | ICD-10-CM | POA: Diagnosis not present

## 2020-02-17 DIAGNOSIS — I5022 Chronic systolic (congestive) heart failure: Secondary | ICD-10-CM | POA: Diagnosis not present

## 2020-02-17 DIAGNOSIS — J90 Pleural effusion, not elsewhere classified: Secondary | ICD-10-CM | POA: Diagnosis not present

## 2020-02-17 DIAGNOSIS — I482 Chronic atrial fibrillation, unspecified: Secondary | ICD-10-CM | POA: Diagnosis not present

## 2020-02-17 LAB — HEPARIN INDUCED PLATELET AB (HIT ANTIBODY): Heparin Induced Plt Ab: 0.055 OD (ref 0.000–0.400)

## 2020-02-17 NOTE — Progress Notes (Signed)
Pulmonary Critical Care Medicine [place]   PULMONARY CRITICAL CARE SERVICE  PROGRESS NOTE  Date of Service: 02/17/2020  Jose Tucker  MPN:[id number]  DOB: Aug 03, 1951   DOA: 12/13/2019  Referring Physician: Merton Border, MD  HPI: Jose Tucker is a 69 y.o. male seen for follow up of Acute on Chronic Respiratory Failure.  Patient currently is back on pressure support 12/5 on now 40% FiO2.  Patient had been on T collar but only tolerated about 40 minutes.  Medications: Reviewed on Rounds  Physical Exam:  Vitals: Temperature 97.4 pulse 70 respiratory 23 blood pressure is 93/55 saturations 100%  Ventilator Settings on T collar failed now back on pressure support FiO2 40% with a pressure of 12/5  . General: Comfortable at this time . Eyes: Grossly normal lids, irises & conjunctiva . ENT: grossly tongue is normal . Neck: no obvious mass . Cardiovascular: S1 S2 normal no gallop . Respiratory: No rhonchi coarse breath sounds are noted . Abdomen: soft . Skin: no rash seen on limited exam . Musculoskeletal: not rigid . Psychiatric:unable to assess . Neurologic: no seizure no involuntary movements         Lab Data:   Basic Metabolic Panel: Recent Labs  Lab 02/11/20 0548 02/14/20 0652 02/16/20 0741  NA 139 139 139  K 4.0 4.4 5.1  CL 97* 101 100  CO2 28 27 29   GLUCOSE 181* 236* 246*  BUN 63* 82* 85*  CREATININE 2.99* 3.50* 3.10*  CALCIUM 9.1 8.7* 8.9  PHOS 2.6 2.0* 2.3*    ABG: No results for input(s): PHART, PCO2ART, PO2ART, HCO3, O2SAT in the last 168 hours.  Liver Function Tests: Recent Labs  Lab 02/11/20 0548 02/14/20 0652 02/16/20 0741  ALBUMIN 2.8* 2.3* 2.3*   No results for input(s): LIPASE, AMYLASE in the last 168 hours. No results for input(s): AMMONIA in the last 168 hours.  CBC: Recent Labs  Lab 02/11/20 0548 02/14/20 0652 02/16/20 0741  WBC 11.7* 12.1* 13.9*  HGB 8.8* 8.1* 8.0*  HCT 30.7* 28.1* 27.7*  MCV 103.7* 101.1*  101.5*  PLT 47* 41* 36*    Cardiac Enzymes: No results for input(s): CKTOTAL, CKMB, CKMBINDEX, TROPONINI in the last 168 hours.  BNP (last 3 results) No results for input(s): BNP in the last 8760 hours.  ProBNP (last 3 results) No results for input(s): PROBNP in the last 8760 hours.  Radiological Exams: No results found.  Assessment/Plan Active Problems:   Acute on chronic respiratory failure with hypoxia (HCC)   Chronic systolic (congestive) heart failure (HCC)   End stage renal disease on dialysis [facility])   Chronic atrial fibrillation (HCC)   Bilateral pleural effusion   1. Acute on chronic respiratory failure hypoxia we will continue to support on pressure support right now. 2. Chronic systolic heart failure is compensated we will continue present therapy 3. End-stage renal disease on hemodialysis 4. Chronic atrial fibrillation rate is controlled 5. Bilateral pleural effusions at baseline we will continue to monitor closely   I have personally seen and evaluated the patient, evaluated laboratory and imaging results, formulated the assessment and plan and placed orders. The Patient requires high complexity decision making with multiple systems involvement.  Rounds were done with the Respiratory Therapy Director and Staff therapists and discussed with nursing staff also.  Allyne Gee, MD [facility] Pulmonary Critical Care Medicine Sleep Medicine

## 2020-02-18 DIAGNOSIS — I5022 Chronic systolic (congestive) heart failure: Secondary | ICD-10-CM | POA: Diagnosis not present

## 2020-02-18 DIAGNOSIS — I482 Chronic atrial fibrillation, unspecified: Secondary | ICD-10-CM | POA: Diagnosis not present

## 2020-02-18 DIAGNOSIS — J90 Pleural effusion, not elsewhere classified: Secondary | ICD-10-CM | POA: Diagnosis not present

## 2020-02-18 DIAGNOSIS — J9621 Acute and chronic respiratory failure with hypoxia: Secondary | ICD-10-CM | POA: Diagnosis not present

## 2020-02-18 LAB — CBC
HCT: 26.1 % — ABNORMAL LOW (ref 39.0–52.0)
Hemoglobin: 8 g/dL — ABNORMAL LOW (ref 13.0–17.0)
MCH: 30.3 pg (ref 26.0–34.0)
MCHC: 30.7 g/dL (ref 30.0–36.0)
MCV: 98.9 fL (ref 80.0–100.0)
Platelets: 43 10*3/uL — ABNORMAL LOW (ref 150–400)
RBC: 2.64 MIL/uL — ABNORMAL LOW (ref 4.22–5.81)
RDW: 18.5 % — ABNORMAL HIGH (ref 11.5–15.5)
WBC: 14.2 10*3/uL — ABNORMAL HIGH (ref 4.0–10.5)
nRBC: 0 % (ref 0.0–0.2)

## 2020-02-18 LAB — RENAL FUNCTION PANEL
Albumin: 2.3 g/dL — ABNORMAL LOW (ref 3.5–5.0)
Anion gap: 12 (ref 5–15)
BUN: 87 mg/dL — ABNORMAL HIGH (ref 8–23)
CO2: 27 mmol/L (ref 22–32)
Calcium: 8.7 mg/dL — ABNORMAL LOW (ref 8.9–10.3)
Chloride: 98 mmol/L (ref 98–111)
Creatinine, Ser: 3.16 mg/dL — ABNORMAL HIGH (ref 0.61–1.24)
GFR calc Af Amer: 22 mL/min — ABNORMAL LOW (ref >60)
GFR calc non Af Amer: 19 mL/min — ABNORMAL LOW (ref >60)
Glucose, Bld: 161 mg/dL — ABNORMAL HIGH (ref 70–99)
Phosphorus: 2.2 mg/dL — ABNORMAL LOW (ref 2.5–4.6)
Potassium: 4.2 mmol/L (ref 3.5–5.1)
Sodium: 137 mmol/L (ref 135–145)

## 2020-02-18 LAB — SEROTONIN RELEASE ASSAY (SRA)
SRA .2 IU/mL UFH Ser-aCnc: 1 % (ref 0–20)
SRA 100IU/mL UFH Ser-aCnc: 1 % (ref 0–20)

## 2020-02-18 NOTE — Progress Notes (Signed)
[facility]  ROUNDING NOTE   Subjective:  Patient seen and evaluated at bedside. Seen and evaluated during dialysis treatment. Tolerating dialysis treatment well.  Objective:  Vital signs in last 24 hours:  Temperature 97.6 pulse 77 respirations 21 blood pressure 1 1/64  Physical Exam: General: Critically ill-appearing  Head: Normocephalic, atraumatic. Dry oral mucosal membranes  Eyes: Anicteric  Neck: Tracheostomy in place  Lungs:  Scattered rhonchi bilateral, vent assisted  Heart: S1S2 irregular  Abdomen:  Soft, nontender, bowel sounds present  Extremities: Trace peripheral edema.  Neurologic: Awake, alert, following commands  Skin: Bilateral upper extremity ecchymoses  Access: Right IJ PermCath    Basic Metabolic Panel: Recent Labs  Lab 02/14/20 0652 02/16/20 0741 02/18/20 0951  NA 139 139 137  K 4.4 5.1 4.2  CL 101 100 98  CO2 27 29 27   GLUCOSE 236* 246* 161*  BUN 82* 85* 87*  CREATININE 3.50* 3.10* 3.16*  CALCIUM 8.7* 8.9 8.7*  PHOS 2.0* 2.3* 2.2*    Liver Function Tests: Recent Labs  Lab 02/14/20 0652 02/16/20 0741 02/18/20 0951  ALBUMIN 2.3* 2.3* 2.3*   No results for input(s): LIPASE, AMYLASE in the last 168 hours. No results for input(s): AMMONIA in the last 168 hours.  CBC: Recent Labs  Lab 02/14/20 0652 02/16/20 0741 02/18/20 0951  WBC 12.1* 13.9* 14.2*  HGB 8.1* 8.0* 8.0*  HCT 28.1* 27.7* 26.1*  MCV 101.1* 101.5* 98.9  PLT 41* 36* 43*    Cardiac Enzymes: No results for input(s): CKTOTAL, CKMB, CKMBINDEX, TROPONINI in the last 168 hours.  BNP: Invalid input(s): POCBNP  CBG: No results for input(s): GLUCAP in the last 168 hours.  Microbiology: Results for orders placed or performed during the hospital encounter of 12/06/2019  C difficile quick scan w PCR reflex     Status: None   Collection Time: 12/18/19  4:49 AM   Specimen: STOOL  Result Value Ref Range Status   C Diff antigen NEGATIVE NEGATIVE Final   C Diff  toxin NEGATIVE NEGATIVE Final   C Diff interpretation No C. difficile detected.  Corrected    Comment: Performed at [facility], [place] [address] [place], [place] [address] CORRECTED ON 03/13 AT 1510: PREVIOUSLY REPORTED AS VALID   Culture, respiratory (non-expectorated)     Status: None   Collection Time: 12/24/19  4:00 PM   Specimen: Tracheal Aspirate; Respiratory  Result Value Ref Range Status   Specimen Description TRACHEAL ASPIRATE  Final   Special Requests NONE  Final   Gram Stain   Final    ABUNDANT WBC PRESENT, PREDOMINANTLY PMN ABUNDANT GRAM POSITIVE RODS FEW GRAM NEGATIVE RODS Performed at [facility], [place] [address], [place], [place] [address]    Culture MODERATE PROTEUS MIRABILIS  Final   Report Status 12/26/2019 FINAL  Final   Organism ID, Bacteria PROTEUS MIRABILIS  Final      Susceptibility   Proteus mirabilis - MIC*    AMPICILLIN <=2 SENSITIVE Sensitive     CEFAZOLIN 8 SENSITIVE Sensitive     CEFEPIME <=0.12 SENSITIVE Sensitive     CEFTAZIDIME <=1 SENSITIVE Sensitive     CEFTRIAXONE <=0.25 SENSITIVE Sensitive     CIPROFLOXACIN <=0.25 SENSITIVE Sensitive     GENTAMICIN <=1 SENSITIVE Sensitive     IMIPENEM 4 SENSITIVE Sensitive     TRIMETH/SULFA <=20 SENSITIVE Sensitive     AMPICILLIN/SULBACTAM <=2 SENSITIVE Sensitive     PIP/TAZO <=4 SENSITIVE Sensitive     * MODERATE  PROTEUS MIRABILIS  Culture, blood (routine x 2)     Status: None   Collection Time: 12/25/19  5:16 PM   Specimen: BLOOD  Result Value Ref Range Status   Specimen Description BLOOD RIGHT ANTECUBITAL  Final   Special Requests   Final    BOTTLES DRAWN AEROBIC AND ANAEROBIC Blood Culture adequate volume   Culture   Final    NO GROWTH 5 DAYS Performed at [facility], [address], [place], [place] [address]    Report Status 12/30/2019 FINAL  Final  Culture, blood (routine x 2)     Status: None   Collection Time: 12/25/19  5:18 PM   Specimen: BLOOD  Result Value Ref Range  Status   Specimen Description BLOOD RIGHT ANTECUBITAL  Final   Special Requests   Final    BOTTLES DRAWN AEROBIC AND ANAEROBIC Blood Culture adequate volume   Culture   Final    NO GROWTH 5 DAYS Performed at [facility], [place] [address], [place], [place] [address]    Report Status 12/30/2019 FINAL  Final  C difficile quick scan w PCR reflex     Status: Abnormal   Collection Time: 12/26/19 12:11 PM   Specimen: STOOL  Result Value Ref Range Status   C Diff antigen POSITIVE (A) NEGATIVE Final   C Diff toxin POSITIVE (A) NEGATIVE Final    Comment: CRITICAL RESULT CALLED TO, READ BACK BY AND VERIFIED WITH:  RN CHRISSY R. @1817  12/26/2019 AKT    C Diff interpretation Toxin producing C. difficile detected.  Final  Culture, blood (routine x 2)     Status: None   Collection Time: 01/05/20 12:43 PM   Specimen: BLOOD  Result Value Ref Range Status   Specimen Description BLOOD RIGHT ANTECUBITAL  Final   Special Requests   Final    BOTTLES DRAWN AEROBIC AND ANAEROBIC Blood Culture adequate volume   Culture   Final    NO GROWTH 5 DAYS Performed at [facility], [address], [place], [place] [address]    Report Status 01/10/2020 FINAL  Final  Culture, respiratory (non-expectorated)     Status: None   Collection Time: 01/05/20  2:55 PM   Specimen: Tracheal Aspirate; Respiratory  Result Value Ref Range Status   Specimen Description TRACHEAL ASPIRATE  Final   Special Requests NONE  Final   Gram Stain   Final    ABUNDANT WBC PRESENT,BOTH PMN AND MONONUCLEAR ABUNDANT GRAM NEGATIVE RODS Performed at [facility], [place] [address], [place], [place] [address]    Culture   Final    ABUNDANT KLEBSIELLA PNEUMONIAE MODERATE PROTEUS MIRABILIS KLEBSIELLA PNEUMONIAE CONFIRMED CARBAPENEMASE RESISTANT ENTEROBACTERIACAE KLEBSIELLA PNEUMONIAE Confirmed Extended Spectrum Beta-Lactamase Producer (ESBL).  In bloodstream infections from ESBL organisms, carbapenems are preferred over  piperacillin/tazobactam. They are shown to have a lower risk of mortality. KLEBSIELLA PNEUMONIAE MULTI-DRUG RESISTANT ORGANISM    Report Status 01/11/2020 FINAL  Final   Organism ID, Bacteria PROTEUS MIRABILIS  Final   Organism ID, Bacteria KLEBSIELLA PNEUMONIAE  Final      Susceptibility   Klebsiella pneumoniae - MIC*    AMPICILLIN >=32 RESISTANT Resistant     CEFAZOLIN >=64 RESISTANT Resistant     CEFEPIME >=32 RESISTANT Resistant     CEFTAZIDIME >=64 RESISTANT Resistant     CEFTRIAXONE >=64 RESISTANT Resistant     CIPROFLOXACIN <=0.25 SENSITIVE Sensitive     GENTAMICIN >=16 RESISTANT Resistant     IMIPENEM >=16  RESISTANT Resistant     TRIMETH/SULFA >=320 RESISTANT Resistant     AMPICILLIN/SULBACTAM >=32 RESISTANT Resistant     PIP/TAZO >=128 RESISTANT Resistant     * ABUNDANT KLEBSIELLA PNEUMONIAE   Proteus mirabilis - MIC*    AMPICILLIN <=2 SENSITIVE Sensitive     CEFAZOLIN <=4 SENSITIVE Sensitive     CEFEPIME <=0.12 SENSITIVE Sensitive     CEFTAZIDIME <=1 SENSITIVE Sensitive     CEFTRIAXONE <=0.25 SENSITIVE Sensitive     CIPROFLOXACIN <=0.25 SENSITIVE Sensitive     GENTAMICIN <=1 SENSITIVE Sensitive     IMIPENEM 8 INTERMEDIATE Intermediate     TRIMETH/SULFA <=20 SENSITIVE Sensitive     AMPICILLIN/SULBACTAM <=2 SENSITIVE Sensitive     PIP/TAZO <=4 SENSITIVE Sensitive     * MODERATE PROTEUS MIRABILIS  Culture, blood (routine x 2)     Status: None   Collection Time: 01/05/20  3:04 PM   Specimen: BLOOD  Result Value Ref Range Status   Specimen Description BLOOD RIGHT ANTECUBITAL  Final   Special Requests   Final    BOTTLES DRAWN AEROBIC ONLY Blood Culture adequate volume   Culture   Final    NO GROWTH 5 DAYS Performed at [facility], [place] [address], [place], [place] [address]    Report Status 01/10/2020 FINAL  Final  Carbapenem Resistance Panel     Status: Abnormal   Collection Time: 01/05/20  3:08 PM  Result Value Ref Range Status   Carba Resistance IMP Gene  NOT DETECTED NOT DETECTED Final   Carba Resistance VIM Gene NOT DETECTED NOT DETECTED Final   Carba Resistance NDM Gene NOT DETECTED NOT DETECTED Final   Carba Resistance KPC Gene DETECTED (A) NOT DETECTED Final    Comment: CRITICAL RESULT CALLED TO, READ BACK BY AND VERIFIED WITH: RBV RN D. Fara Olden [phone] FCP    Carba Resistance OXA48 Gene NOT DETECTED NOT DETECTED Final    Comment: (NOTE) Cepheid Carba-R is an FDA-cleared nucleic acid amplification test  (NAAT)for the detection and differentiation of genes encoding the  most prevalent carbapenemases in bacterial isolate samples. Carbapenemase gene identification and implementation of comprehensive  infection control measures are recommended by the CDC to prevent the  spread of the resistant organisms. Performed at [facility], [place] [address], [place], [place] [address]   SARS CORONAVIRUS 2 (TAT 6-24 HRS) Nasopharyngeal Nasopharyngeal Swab     Status: None   Collection Time: 02/10/20  3:46 PM   Specimen: Nasopharyngeal Swab  Result Value Ref Range Status   SARS Coronavirus 2 NEGATIVE NEGATIVE Final    Comment: (NOTE) SARS-CoV-2 target nucleic acids are NOT DETECTED. The SARS-CoV-2 RNA is generally detectable in upper and lower respiratory specimens during the acute phase of infection. Negative results do not preclude SARS-CoV-2 infection, do not rule out co-infections with other pathogens, and should not be used as the sole basis for treatment or other patient management decisions. Negative results must be combined with clinical observations, patient history, and epidemiological information. The expected result is Negative. Fact Sheet for Patients: SugarRoll.be Fact Sheet for Healthcare Providers: https://www.woods-mathews.com/ This test is not yet approved or cleared by the [place] FDA and  has been authorized for detection and/or diagnosis of SARS-CoV-2 by FDA under  an Emergency Use Authorization (EUA). This EUA will remain  in effect (meaning this test can be used) for the duration of the COVID-19 declaration under Section 56 4(b)(1) of the Act, 21 U.S.C. section 360bbb-3(b)(1), unless the authorization  is terminated or revoked sooner. Performed at [facility], [place] [address], [place], [place] [address]   Culture, respiratory (non-expectorated)     Status: None   Collection Time: 02/13/20 11:24 AM   Specimen: Tracheal Aspirate; Respiratory  Result Value Ref Range Status   Specimen Description TRACHEAL ASPIRATE  Final   Special Requests NONE  Final   Gram Stain   Final    RARE WBC PRESENT, PREDOMINANTLY PMN MODERATE GRAM VARIABLE ROD    Culture   Final    ABUNDANT Consistent with normal respiratory flora. Performed at [facility], [place] [address] [place], [place] [address]    Report Status 02/16/2020 FINAL  Final    Coagulation Studies: No results for input(s): LABPROT, INR in the last 72 hours.  Urinalysis: No results for input(s): COLORURINE, LABSPEC, PHURINE, GLUCOSEU, HGBUR, BILIRUBINUR, KETONESUR, PROTEINUR, UROBILINOGEN, NITRITE, LEUKOCYTESUR in the last 72 hours.  Invalid input(s): APPERANCEUR    Imaging: No results found.   Medications:     iohexol, lidocaine (PF)  Assessment/ Plan:  69 y.o. male with a PMHx of ESRD on HD, CHF, aortic stenosis, hypertension, diabetes mellitus type 2, hyperlipidemia, obstructive sleep apnea, chronic venous stasis with LE edema, and obesity who was admitted to [facility] on 12/12/2019 for ongoing treatment of respiratory failure, ESRD, malnutrition, and generalized debility.    1.  ESRD on HD MWF.    Patient seen and evaluated during dialysis treatment.  Tolerating well.  Ultrafiltration target 2 kg.  2.  Acute respiratory failure.    Patient remains ventilator dependent and has been very difficult to wean from the ventilator.  Continue current vent support.  3.  Secondary  hyperparathyroidism.    Continue to monitor bone mineral metabolism parameters.  Serum phosphorus 2.2 today.  4.  Anemia of chronic kidney disease.  Hemoglobin relatively stable at 8.0.  Maintain the patient on Retacrit 4070 with dialysis.   LOS: 0 Larz Mark 5/14/20212:11 PM

## 2020-02-18 NOTE — Progress Notes (Addendum)
Pulmonary Critical Care Medicine [place]   PULMONARY CRITICAL CARE SERVICE  PROGRESS NOTE  Date of Service: 02/18/2020  Jose Tucker  ZTI:[id number]  DOB: 06/05/1951   DOA: 12/07/2019  Referring Physician: Merton Border, MD  HPI: Jose Tucker is a 69 y.o. male seen for follow up of Acute on Chronic Respiratory Failure. Patient remains on psp 40% Fio2.  Sating with with no distress.  Medications: Reviewed on Rounds  Physical Exam:  Vitals: Pulse 77 respirations 21 BP 101/64 O2 sat 100% temp 97.6  Ventilator Settings pressure support 12/8 FiO2 40%  . General: Comfortable at this time . Eyes: Grossly normal lids, irises & conjunctiva . ENT: grossly tongue is normal . Neck: no obvious mass . Cardiovascular: S1 S2 normal no gallop . Respiratory: No rales or rhonchi noted . Abdomen: soft . Skin: no rash seen on limited exam . Musculoskeletal: not rigid . Psychiatric:unable to assess . Neurologic: no seizure no involuntary movements         Lab Data:   Basic Metabolic Panel: Recent Labs  Lab 02/14/20 0652 02/16/20 0741 02/18/20 0951  NA 139 139 137  K 4.4 5.1 4.2  CL 101 100 98  CO2 27 29 27   GLUCOSE 236* 246* 161*  BUN 82* 85* 87*  CREATININE 3.50* 3.10* 3.16*  CALCIUM 8.7* 8.9 8.7*  PHOS 2.0* 2.3* 2.2*    ABG: No results for input(s): PHART, PCO2ART, PO2ART, HCO3, O2SAT in the last 168 hours.  Liver Function Tests: Recent Labs  Lab 02/14/20 0652 02/16/20 0741 02/18/20 0951  ALBUMIN 2.3* 2.3* 2.3*   No results for input(s): LIPASE, AMYLASE in the last 168 hours. No results for input(s): AMMONIA in the last 168 hours.  CBC: Recent Labs  Lab 02/14/20 0652 02/16/20 0741 02/18/20 0951  WBC 12.1* 13.9* 14.2*  HGB 8.1* 8.0* 8.0*  HCT 28.1* 27.7* 26.1*  MCV 101.1* 101.5* 98.9  PLT 41* 36* 43*    Cardiac Enzymes: No results for input(s): CKTOTAL, CKMB, CKMBINDEX, TROPONINI in the last 168 hours.  BNP (last 3 results) No  results for input(s): BNP in the last 8760 hours.  ProBNP (last 3 results) No results for input(s): PROBNP in the last 8760 hours.  Radiological Exams: No results found.  Assessment/Plan Active Problems:   Acute on chronic respiratory failure with hypoxia (HCC)   Chronic systolic (congestive) heart failure (HCC)   End stage renal disease on dialysis [facility])   Chronic atrial fibrillation (HCC)   Bilateral pleural effusion   1. Acute on chronic respiratory failure hypoxia patient is on pressure support as tolerated currently on 12/8 with an FiO2 40% continue supportive measures and pulmonary toilet. 2. Chronic systolic heart failure is compensated we will continue present therapy 3. End-stage renal disease on hemodialysis 4. Chronic atrial fibrillation rate is controlled 5. Bilateral pleural effusions at baseline we will continue to monitor closely   I have personally seen and evaluated the patient, evaluated laboratory and imaging results, formulated the assessment and plan and placed orders. The Patient requires high complexity decision making with multiple systems involvement.  Rounds were done with the Respiratory Therapy Director and Staff therapists and discussed with nursing staff also.  Allyne Gee, MD [facility] Pulmonary Critical Care Medicine Sleep Medicine

## 2020-02-19 DIAGNOSIS — J90 Pleural effusion, not elsewhere classified: Secondary | ICD-10-CM | POA: Diagnosis not present

## 2020-02-19 DIAGNOSIS — I5022 Chronic systolic (congestive) heart failure: Secondary | ICD-10-CM | POA: Diagnosis not present

## 2020-02-19 DIAGNOSIS — I482 Chronic atrial fibrillation, unspecified: Secondary | ICD-10-CM | POA: Diagnosis not present

## 2020-02-19 DIAGNOSIS — J9621 Acute and chronic respiratory failure with hypoxia: Secondary | ICD-10-CM | POA: Diagnosis not present

## 2020-02-19 NOTE — Progress Notes (Addendum)
Pulmonary Critical Care Medicine [place]   PULMONARY CRITICAL CARE SERVICE  PROGRESS NOTE  Date of Service: 02/19/2020  Jose Tucker  WUJ:[id number]  DOB: July 13, 1951   DOA: 01/03/2020  Referring Physician: Merton Border, MD  HPI: Jose Tucker is a 69 y.o. male seen for follow up of Acute on Chronic Respiratory Failure. Patient continues on an 8-hour goal today on 40% aerosol trach collar satting well no fever distress.  Medications: Reviewed on Rounds  Physical Exam:  Vitals: Pulse 84 respirations 23 BP 90/50 O2 sat 100% temp 97.8  Ventilator Settings ATC 40%  . General: Comfortable at this time . Eyes: Grossly normal lids, irises & conjunctiva . ENT: grossly tongue is normal . Neck: no obvious mass . Cardiovascular: S1 S2 normal no gallop . Respiratory: No rales or rhonchi noted . Abdomen: soft . Skin: no rash seen on limited exam . Musculoskeletal: not rigid . Psychiatric:unable to assess . Neurologic: no seizure no involuntary movements         Lab Data:   Basic Metabolic Panel: Recent Labs  Lab 02/14/20 0652 02/16/20 0741 02/18/20 0951  NA 139 139 137  K 4.4 5.1 4.2  CL 101 100 98  CO2 27 29 27   GLUCOSE 236* 246* 161*  BUN 82* 85* 87*  CREATININE 3.50* 3.10* 3.16*  CALCIUM 8.7* 8.9 8.7*  PHOS 2.0* 2.3* 2.2*    ABG: No results for input(s): PHART, PCO2ART, PO2ART, HCO3, O2SAT in the last 168 hours.  Liver Function Tests: Recent Labs  Lab 02/14/20 0652 02/16/20 0741 02/18/20 0951  ALBUMIN 2.3* 2.3* 2.3*   No results for input(s): LIPASE, AMYLASE in the last 168 hours. No results for input(s): AMMONIA in the last 168 hours.  CBC: Recent Labs  Lab 02/14/20 0652 02/16/20 0741 02/18/20 0951  WBC 12.1* 13.9* 14.2*  HGB 8.1* 8.0* 8.0*  HCT 28.1* 27.7* 26.1*  MCV 101.1* 101.5* 98.9  PLT 41* 36* 43*    Cardiac Enzymes: No results for input(s): CKTOTAL, CKMB, CKMBINDEX, TROPONINI in the last 168 hours.  BNP (last 3  results) No results for input(s): BNP in the last 8760 hours.  ProBNP (last 3 results) No results for input(s): PROBNP in the last 8760 hours.  Radiological Exams: No results found.  Assessment/Plan Active Problems:   Acute on chronic respiratory failure with hypoxia (HCC)   Chronic systolic (congestive) heart failure (HCC)   End stage renal disease on dialysis [facility])   Chronic atrial fibrillation (HCC)   Bilateral pleural effusion   1. Acute on chronic respiratory failure hypoxia patient is on pressure support as tolerated currently on 12/8 with an FiO2 40% continue supportive measures and pulmonary toilet. 2. Chronic systolic heart failure is compensated we will continue present therapy 3. End-stage renal disease on hemodialysis 4. Chronic atrial fibrillation rate is controlled 5. Bilateral pleural effusions at baseline we will continue to monitor closely   I have personally seen and evaluated the patient, evaluated laboratory and imaging results, formulated the assessment and plan and placed orders. The Patient requires high complexity decision making with multiple systems involvement.  Rounds were done with the Respiratory Therapy Director and Staff therapists and discussed with nursing staff also.  Allyne Gee, MD [facility] Pulmonary Critical Care Medicine Sleep Medicine

## 2020-02-20 DIAGNOSIS — J9621 Acute and chronic respiratory failure with hypoxia: Secondary | ICD-10-CM | POA: Diagnosis not present

## 2020-02-20 DIAGNOSIS — J90 Pleural effusion, not elsewhere classified: Secondary | ICD-10-CM | POA: Diagnosis not present

## 2020-02-20 DIAGNOSIS — I5022 Chronic systolic (congestive) heart failure: Secondary | ICD-10-CM | POA: Diagnosis not present

## 2020-02-20 DIAGNOSIS — I482 Chronic atrial fibrillation, unspecified: Secondary | ICD-10-CM | POA: Diagnosis not present

## 2020-02-20 LAB — CBC
HCT: 23.8 % — ABNORMAL LOW (ref 39.0–52.0)
Hemoglobin: 7 g/dL — ABNORMAL LOW (ref 13.0–17.0)
MCH: 28.6 pg (ref 26.0–34.0)
MCHC: 29.4 g/dL — ABNORMAL LOW (ref 30.0–36.0)
MCV: 97.1 fL (ref 80.0–100.0)
Platelets: 54 10*3/uL — ABNORMAL LOW (ref 150–400)
RBC: 2.45 MIL/uL — ABNORMAL LOW (ref 4.22–5.81)
RDW: 18.3 % — ABNORMAL HIGH (ref 11.5–15.5)
WBC: 17.5 10*3/uL — ABNORMAL HIGH (ref 4.0–10.5)
nRBC: 0 % (ref 0.0–0.2)

## 2020-02-20 LAB — HEPATITIS B CORE ANTIBODY, TOTAL: Hep B Core Total Ab: NONREACTIVE

## 2020-02-20 LAB — HEPATITIS B SURFACE ANTIGEN: Hepatitis B Surface Ag: NONREACTIVE

## 2020-02-20 NOTE — Progress Notes (Addendum)
Pulmonary Critical Care Medicine [place]   PULMONARY CRITICAL CARE SERVICE  PROGRESS NOTE  Date of Service: 02/20/2020  Jose Tucker  QJF:[id number]  DOB: Aug 24, 1951   DOA: 12/17/2019  Referring Physician: Merton Border, MD  HPI: Jose Tucker is a 69 y.o. male seen for follow up of Acute on Chronic Respiratory Failure. Patient remains on aerosol trach collar 40% FiO2 at this time satting well no distress.  Medications: Reviewed on Rounds  Physical Exam:  Vitals: Pulse 78 respirations 22 BP 103/56 O2 sat 100% temp 97  Ventilator Settings ATC 40%  . General: Comfortable at this time . Eyes: Grossly normal lids, irises & conjunctiva . ENT: grossly tongue is normal . Neck: no obvious mass . Cardiovascular: S1 S2 normal no gallop . Respiratory: No rales or rhonchi noted . Abdomen: soft . Skin: no rash seen on limited exam . Musculoskeletal: not rigid . Psychiatric:unable to assess . Neurologic: no seizure no involuntary movements         Lab Data:   Basic Metabolic Panel: Recent Labs  Lab 02/14/20 0652 02/16/20 0741 02/18/20 0951  NA 139 139 137  K 4.4 5.1 4.2  CL 101 100 98  CO2 27 29 27   GLUCOSE 236* 246* 161*  BUN 82* 85* 87*  CREATININE 3.50* 3.10* 3.16*  CALCIUM 8.7* 8.9 8.7*  PHOS 2.0* 2.3* 2.2*    ABG: No results for input(s): PHART, PCO2ART, PO2ART, HCO3, O2SAT in the last 168 hours.  Liver Function Tests: Recent Labs  Lab 02/14/20 0652 02/16/20 0741 02/18/20 0951  ALBUMIN 2.3* 2.3* 2.3*   No results for input(s): LIPASE, AMYLASE in the last 168 hours. No results for input(s): AMMONIA in the last 168 hours.  CBC: Recent Labs  Lab 02/14/20 0652 02/16/20 0741 02/18/20 0951 02/20/20 0502  WBC 12.1* 13.9* 14.2* 17.5*  HGB 8.1* 8.0* 8.0* 7.0*  HCT 28.1* 27.7* 26.1* 23.8*  MCV 101.1* 101.5* 98.9 97.1  PLT 41* 36* 43* 54*    Cardiac Enzymes: No results for input(s): CKTOTAL, CKMB, CKMBINDEX, TROPONINI in the last 168  hours.  BNP (last 3 results) No results for input(s): BNP in the last 8760 hours.  ProBNP (last 3 results) No results for input(s): PROBNP in the last 8760 hours.  Radiological Exams: No results found.  Assessment/Plan Active Problems:   Acute on chronic respiratory failure with hypoxia (HCC)   Chronic systolic (congestive) heart failure (HCC)   End stage renal disease on dialysis [facility])   Chronic atrial fibrillation (HCC)   Bilateral pleural effusion   1. Acute on chronic respiratory failure hypoxia patient is on pressure support as tolerated currently on 12/8 with an FiO2 40% continue supportive measures and pulmonary toilet. 2. Chronic systolic heart failure is compensated we will continue present therapy 3. End-stage renal disease on hemodialysis 4. Chronic atrial fibrillation rate is controlled 5. Bilateral pleural effusions at baseline we will continue to monitor closely   I have personally seen and evaluated the patient, evaluated laboratory and imaging results, formulated the assessment and plan and placed orders. The Patient requires high complexity decision making with multiple systems involvement.  Rounds were done with the Respiratory Therapy Director and Staff therapists and discussed with nursing staff also.  Allyne Gee, MD [facility] Pulmonary Critical Care Medicine Sleep Medicine

## 2020-02-21 DIAGNOSIS — J9621 Acute and chronic respiratory failure with hypoxia: Secondary | ICD-10-CM | POA: Diagnosis not present

## 2020-02-21 DIAGNOSIS — I482 Chronic atrial fibrillation, unspecified: Secondary | ICD-10-CM | POA: Diagnosis not present

## 2020-02-21 DIAGNOSIS — I5022 Chronic systolic (congestive) heart failure: Secondary | ICD-10-CM | POA: Diagnosis not present

## 2020-02-21 DIAGNOSIS — J90 Pleural effusion, not elsewhere classified: Secondary | ICD-10-CM | POA: Diagnosis not present

## 2020-02-21 LAB — RENAL FUNCTION PANEL
Albumin: 2.3 g/dL — ABNORMAL LOW (ref 3.5–5.0)
Anion gap: 14 (ref 5–15)
BUN: 96 mg/dL — ABNORMAL HIGH (ref 8–23)
CO2: 25 mmol/L (ref 22–32)
Calcium: 9 mg/dL (ref 8.9–10.3)
Chloride: 97 mmol/L — ABNORMAL LOW (ref 98–111)
Creatinine, Ser: 3.3 mg/dL — ABNORMAL HIGH (ref 0.61–1.24)
GFR calc Af Amer: 21 mL/min — ABNORMAL LOW (ref >60)
GFR calc non Af Amer: 18 mL/min — ABNORMAL LOW (ref >60)
Glucose, Bld: 207 mg/dL — ABNORMAL HIGH (ref 70–99)
Phosphorus: 3.4 mg/dL (ref 2.5–4.6)
Potassium: 5.1 mmol/L (ref 3.5–5.1)
Sodium: 136 mmol/L (ref 135–145)

## 2020-02-21 LAB — CBC
HCT: 26.7 % — ABNORMAL LOW (ref 39.0–52.0)
Hemoglobin: 7.7 g/dL — ABNORMAL LOW (ref 13.0–17.0)
MCH: 28.4 pg (ref 26.0–34.0)
MCHC: 28.8 g/dL — ABNORMAL LOW (ref 30.0–36.0)
MCV: 98.5 fL (ref 80.0–100.0)
Platelets: 79 10*3/uL — ABNORMAL LOW (ref 150–400)
RBC: 2.71 MIL/uL — ABNORMAL LOW (ref 4.22–5.81)
RDW: 18.4 % — ABNORMAL HIGH (ref 11.5–15.5)
WBC: 25.7 10*3/uL — ABNORMAL HIGH (ref 4.0–10.5)
nRBC: 0.2 % (ref 0.0–0.2)

## 2020-02-21 NOTE — Progress Notes (Signed)
Pulmonary Critical Care Medicine [place]   PULMONARY CRITICAL CARE SERVICE  PROGRESS NOTE  Date of Service: 02/21/2020  Jose Tucker  IHK:[id number]  DOB: [birth year]   DOA: 12/19/2019  Referring Physician: Merton Border, MD  HPI: Jose Tucker is a 69 y.o. male seen for follow up of Acute on Chronic Respiratory Failure.  Patient currently is on T collar has been on 35% FiO2 good saturations are noted  Medications: Reviewed on Rounds  Physical Exam:  Vitals: Temperature 96.8 pulse 78 respiratory rate 30 blood pressure is 134/60 saturations 95%  Ventilator Settings off ventilator on T collar with an FiO2 of 35%  . General: Comfortable at this time . Eyes: Grossly normal lids, irises & conjunctiva . ENT: grossly tongue is normal . Neck: no obvious mass . Cardiovascular: S1 S2 normal no gallop . Respiratory: No rhonchi coarse breath sounds . Abdomen: soft . Skin: no rash seen on limited exam . Musculoskeletal: not rigid . Psychiatric:unable to assess . Neurologic: no seizure no involuntary movements         Lab Data:   Basic Metabolic Panel: Recent Labs  Lab 02/16/20 0741 02/18/20 0951 02/21/20 0710  NA 139 137 136  K 5.1 4.2 5.1  CL 100 98 97*  CO2 29 27 25   GLUCOSE 246* 161* 207*  BUN 85* 87* 96*  CREATININE 3.10* 3.16* 3.30*  CALCIUM 8.9 8.7* 9.0  PHOS 2.3* 2.2* 3.4    ABG: No results for input(s): PHART, PCO2ART, PO2ART, HCO3, O2SAT in the last 168 hours.  Liver Function Tests: Recent Labs  Lab 02/16/20 0741 02/18/20 0951 02/21/20 0710  ALBUMIN 2.3* 2.3* 2.3*   No results for input(s): LIPASE, AMYLASE in the last 168 hours. No results for input(s): AMMONIA in the last 168 hours.  CBC: Recent Labs  Lab 02/16/20 0741 02/18/20 0951 02/20/20 0502 02/21/20 0710  WBC 13.9* 14.2* 17.5* 25.7*  HGB 8.0* 8.0* 7.0* 7.7*  HCT 27.7* 26.1* 23.8* 26.7*  MCV 101.5* 98.9 97.1 98.5  PLT 36* 43* 54* 79*    Cardiac Enzymes: No results  for input(s): CKTOTAL, CKMB, CKMBINDEX, TROPONINI in the last 168 hours.  BNP (last 3 results) No results for input(s): BNP in the last 8760 hours.  ProBNP (last 3 results) No results for input(s): PROBNP in the last 8760 hours.  Radiological Exams: No results found.  Assessment/Plan Active Problems:   Acute on chronic respiratory failure with hypoxia (HCC)   Chronic systolic (congestive) heart failure (HCC)   End stage renal disease on dialysis [facility])   Chronic atrial fibrillation (HCC)   Bilateral pleural effusion   1. Acute on chronic respiratory failure with hypoxia continue T collar trials patient currently is on 35% FiO2 good saturations are noted. 2. Chronic systolic heart failure compensated we will continue to follow 3. End-stage renal failure on hemodialysis continue supportive care 4. Chronic atrial fibrillation rate controlled 5. Bilateral effusions we will continue with supportive care follow-up x-rays as warranted   I have personally seen and evaluated the patient, evaluated laboratory and imaging results, formulated the assessment and plan and placed orders. The Patient requires high complexity decision making with multiple systems involvement.  Rounds were done with the Respiratory Therapy Director and Staff therapists and discussed with nursing staff also.  Allyne Gee, MD [facility] Pulmonary Critical Care Medicine Sleep Medicine

## 2020-02-21 NOTE — Progress Notes (Signed)
[facility]  ROUNDING NOTE   Subjective:  Patient due for dialysis treatment today. Currently off the ventilator.  Objective:  Vital signs in last 24 hours:  Temperature 96.8 pulse 78 respirations 34 blood pressure 134/45  Physical Exam: General: Critically ill-appearing  Head: Normocephalic, atraumatic. Dry oral mucosal membranes  Eyes: Anicteric  Neck: Tracheostomy in place  Lungs:  Scattered rhonchi bilateral, normal effort  Heart: S1S2 irregular  Abdomen:  Soft, nontender, bowel sounds present  Extremities: Trace peripheral edema.  Neurologic: Awake, alert, following commands  Skin: Bilateral upper extremity ecchymoses  Access: Right IJ PermCath    Basic Metabolic Panel: Recent Labs  Lab 02/16/20 0741 02/18/20 0951 02/21/20 0710  NA 139 137 136  K 5.1 4.2 5.1  CL 100 98 97*  CO2 29 27 25   GLUCOSE 246* 161* 207*  BUN 85* 87* 96*  CREATININE 3.10* 3.16* 3.30*  CALCIUM 8.9 8.7* 9.0  PHOS 2.3* 2.2* 3.4    Liver Function Tests: Recent Labs  Lab 02/16/20 0741 02/18/20 0951 02/21/20 0710  ALBUMIN 2.3* 2.3* 2.3*   No results for input(s): LIPASE, AMYLASE in the last 168 hours. No results for input(s): AMMONIA in the last 168 hours.  CBC: Recent Labs  Lab 02/16/20 0741 02/18/20 0951 02/20/20 0502 02/21/20 0710  WBC 13.9* 14.2* 17.5* 25.7*  HGB 8.0* 8.0* 7.0* 7.7*  HCT 27.7* 26.1* 23.8* 26.7*  MCV 101.5* 98.9 97.1 98.5  PLT 36* 43* 54* 79*    Cardiac Enzymes: No results for input(s): CKTOTAL, CKMB, CKMBINDEX, TROPONINI in the last 168 hours.  BNP: Invalid input(s): POCBNP  CBG: No results for input(s): GLUCAP in the last 168 hours.  Microbiology: Results for orders placed or performed during the hospital encounter of 12/26/2019  C difficile quick scan w PCR reflex     Status: None   Collection Time: 12/18/19  4:49 AM   Specimen: STOOL  Result Value Ref Range Status   C Diff antigen NEGATIVE NEGATIVE Final   C Diff toxin NEGATIVE  NEGATIVE Final   C Diff interpretation No C. difficile detected.  Corrected    Comment: Performed at [facility], [place] [address] [place], [place] [address] CORRECTED ON 03/13 AT 1510: PREVIOUSLY REPORTED AS VALID   Culture, respiratory (non-expectorated)     Status: None   Collection Time: 12/24/19  4:00 PM   Specimen: Tracheal Aspirate; Respiratory  Result Value Ref Range Status   Specimen Description TRACHEAL ASPIRATE  Final   Special Requests NONE  Final   Gram Stain   Final    ABUNDANT WBC PRESENT, PREDOMINANTLY PMN ABUNDANT GRAM POSITIVE RODS FEW GRAM NEGATIVE RODS Performed at [facility], [place] [address], [place], [place] [address]    Culture MODERATE PROTEUS MIRABILIS  Final   Report Status 12/26/2019 FINAL  Final   Organism ID, Bacteria PROTEUS MIRABILIS  Final      Susceptibility   Proteus mirabilis - MIC*    AMPICILLIN <=2 SENSITIVE Sensitive     CEFAZOLIN 8 SENSITIVE Sensitive     CEFEPIME <=0.12 SENSITIVE Sensitive     CEFTAZIDIME <=1 SENSITIVE Sensitive     CEFTRIAXONE <=0.25 SENSITIVE Sensitive     CIPROFLOXACIN <=0.25 SENSITIVE Sensitive     GENTAMICIN <=1 SENSITIVE Sensitive     IMIPENEM 4 SENSITIVE Sensitive     TRIMETH/SULFA <=20 SENSITIVE Sensitive     AMPICILLIN/SULBACTAM <=2 SENSITIVE Sensitive     PIP/TAZO <=4 SENSITIVE Sensitive     * MODERATE  PROTEUS MIRABILIS  Culture, blood (routine x 2)     Status: None   Collection Time: 12/25/19  5:16 PM   Specimen: BLOOD  Result Value Ref Range Status   Specimen Description BLOOD RIGHT ANTECUBITAL  Final   Special Requests   Final    BOTTLES DRAWN AEROBIC AND ANAEROBIC Blood Culture adequate volume   Culture   Final    NO GROWTH 5 DAYS Performed at [facility], [address] [place], [place] [address]    Report Status 12/30/2019 FINAL  Final  Culture, blood (routine x 2)     Status: None   Collection Time: 12/25/19  5:18 PM   Specimen: BLOOD  Result Value Ref Range Status    Specimen Description BLOOD RIGHT ANTECUBITAL  Final   Special Requests   Final    BOTTLES DRAWN AEROBIC AND ANAEROBIC Blood Culture adequate volume   Culture   Final    NO GROWTH 5 DAYS Performed at [facility], [place] [address], [place], [place] [address]    Report Status 12/30/2019 FINAL  Final  C difficile quick scan w PCR reflex     Status: Abnormal   Collection Time: 12/26/19 12:11 PM   Specimen: STOOL  Result Value Ref Range Status   C Diff antigen POSITIVE (A) NEGATIVE Final   C Diff toxin POSITIVE (A) NEGATIVE Final    Comment: CRITICAL RESULT CALLED TO, READ BACK BY AND VERIFIED WITH:  RN CHRISSY R. @1817  12/26/2019 AKT    C Diff interpretation Toxin producing C. difficile detected.  Final  Culture, blood (routine x 2)     Status: None   Collection Time: 01/05/20 12:43 PM   Specimen: BLOOD  Result Value Ref Range Status   Specimen Description BLOOD RIGHT ANTECUBITAL  Final   Special Requests   Final    BOTTLES DRAWN AEROBIC AND ANAEROBIC Blood Culture adequate volume   Culture   Final    NO GROWTH 5 DAYS Performed at [facility], [address] [place], [place] [address]    Report Status 01/10/2020 FINAL  Final  Culture, respiratory (non-expectorated)     Status: None   Collection Time: 01/05/20  2:55 PM   Specimen: Tracheal Aspirate; Respiratory  Result Value Ref Range Status   Specimen Description TRACHEAL ASPIRATE  Final   Special Requests NONE  Final   Gram Stain   Final    ABUNDANT WBC PRESENT,BOTH PMN AND MONONUCLEAR ABUNDANT GRAM NEGATIVE RODS Performed at [facility], [place] [address] [place], [place] [address]    Culture   Final    ABUNDANT KLEBSIELLA PNEUMONIAE MODERATE PROTEUS MIRABILIS KLEBSIELLA PNEUMONIAE CONFIRMED CARBAPENEMASE RESISTANT ENTEROBACTERIACAE KLEBSIELLA PNEUMONIAE Confirmed Extended Spectrum Beta-Lactamase Producer (ESBL).  In bloodstream infections from ESBL organisms, carbapenems are preferred over  piperacillin/tazobactam. They are shown to have a lower risk of mortality. KLEBSIELLA PNEUMONIAE MULTI-DRUG RESISTANT ORGANISM    Report Status 01/11/2020 FINAL  Final   Organism ID, Bacteria PROTEUS MIRABILIS  Final   Organism ID, Bacteria KLEBSIELLA PNEUMONIAE  Final      Susceptibility   Klebsiella pneumoniae - MIC*    AMPICILLIN >=32 RESISTANT Resistant     CEFAZOLIN >=64 RESISTANT Resistant     CEFEPIME >=32 RESISTANT Resistant     CEFTAZIDIME >=64 RESISTANT Resistant     CEFTRIAXONE >=64 RESISTANT Resistant     CIPROFLOXACIN <=0.25 SENSITIVE Sensitive     GENTAMICIN >=16 RESISTANT Resistant     IMIPENEM >=16  RESISTANT Resistant     TRIMETH/SULFA >=320 RESISTANT Resistant     AMPICILLIN/SULBACTAM >=32 RESISTANT Resistant     PIP/TAZO >=128 RESISTANT Resistant     * ABUNDANT KLEBSIELLA PNEUMONIAE   Proteus mirabilis - MIC*    AMPICILLIN <=2 SENSITIVE Sensitive     CEFAZOLIN <=4 SENSITIVE Sensitive     CEFEPIME <=0.12 SENSITIVE Sensitive     CEFTAZIDIME <=1 SENSITIVE Sensitive     CEFTRIAXONE <=0.25 SENSITIVE Sensitive     CIPROFLOXACIN <=0.25 SENSITIVE Sensitive     GENTAMICIN <=1 SENSITIVE Sensitive     IMIPENEM 8 INTERMEDIATE Intermediate     TRIMETH/SULFA <=20 SENSITIVE Sensitive     AMPICILLIN/SULBACTAM <=2 SENSITIVE Sensitive     PIP/TAZO <=4 SENSITIVE Sensitive     * MODERATE PROTEUS MIRABILIS  Culture, blood (routine x 2)     Status: None   Collection Time: 01/05/20  3:04 PM   Specimen: BLOOD  Result Value Ref Range Status   Specimen Description BLOOD RIGHT ANTECUBITAL  Final   Special Requests   Final    BOTTLES DRAWN AEROBIC ONLY Blood Culture adequate volume   Culture   Final    NO GROWTH 5 DAYS Performed at [facility], [place] [address] [place], [place] [address]    Report Status 01/10/2020 FINAL  Final  Carbapenem Resistance Panel     Status: Abnormal   Collection Time: 01/05/20  3:08 PM  Result Value Ref Range Status   Carba Resistance IMP Gene  NOT DETECTED NOT DETECTED Final   Carba Resistance VIM Gene NOT DETECTED NOT DETECTED Final   Carba Resistance NDM Gene NOT DETECTED NOT DETECTED Final   Carba Resistance KPC Gene DETECTED (A) NOT DETECTED Final    Comment: CRITICAL RESULT CALLED TO, READ BACK BY AND VERIFIED WITH: RBV RN D. Fara Olden [id number] FCP    Carba Resistance OXA48 Gene NOT DETECTED NOT DETECTED Final    Comment: (NOTE) Cepheid Carba-R is an FDA-cleared nucleic acid amplification test  (NAAT)for the detection and differentiation of genes encoding the  most prevalent carbapenemases in bacterial isolate samples. Carbapenemase gene identification and implementation of comprehensive  infection control measures are recommended by the CDC to prevent the  spread of the resistant organisms. Performed at [facility], [place] [address] [place], [place] [address]   SARS CORONAVIRUS 2 (TAT 6-24 HRS) Nasopharyngeal Nasopharyngeal Swab     Status: None   Collection Time: 02/10/20  3:46 PM   Specimen: Nasopharyngeal Swab  Result Value Ref Range Status   SARS Coronavirus 2 NEGATIVE NEGATIVE Final    Comment: (NOTE) SARS-CoV-2 target nucleic acids are NOT DETECTED. The SARS-CoV-2 RNA is generally detectable in upper and lower respiratory specimens during the acute phase of infection. Negative results do not preclude SARS-CoV-2 infection, do not rule out co-infections with other pathogens, and should not be used as the sole basis for treatment or other patient management decisions. Negative results must be combined with clinical observations, patient history, and epidemiological information. The expected result is Negative. Fact Sheet for Patients: SugarRoll.be Fact Sheet for Healthcare Providers: https://www.woods-mathews.com/ This test is not yet approved or cleared by the [place] FDA and  has been authorized for detection and/or diagnosis of SARS-CoV-2 by FDA under  an Emergency Use Authorization (EUA). This EUA will remain  in effect (meaning this test can be used) for the duration of the COVID-19 declaration under Section 56 4(b)(1) of the Act, 21 U.S.C. section 360bbb-3(b)(1), unless the authorization  is terminated or revoked sooner. Performed at [facility], [place] [address], [place], [place] [address]   Culture, respiratory (non-expectorated)     Status: None   Collection Time: 02/13/20 11:24 AM   Specimen: Tracheal Aspirate; Respiratory  Result Value Ref Range Status   Specimen Description TRACHEAL ASPIRATE  Final   Special Requests NONE  Final   Gram Stain   Final    RARE WBC PRESENT, PREDOMINANTLY PMN MODERATE GRAM VARIABLE ROD    Culture   Final    ABUNDANT Consistent with normal respiratory flora. Performed at [facility], [place] [address], [place], [place] [address]    Report Status 02/16/2020 FINAL  Final    Coagulation Studies: No results for input(s): LABPROT, INR in the last 72 hours.  Urinalysis: No results for input(s): COLORURINE, LABSPEC, PHURINE, GLUCOSEU, HGBUR, BILIRUBINUR, KETONESUR, PROTEINUR, UROBILINOGEN, NITRITE, LEUKOCYTESUR in the last 72 hours.  Invalid input(s): APPERANCEUR    Imaging: No results found.   Medications:     iohexol, lidocaine (PF)  Assessment/ Plan:  69 y.o. male with a PMHx of ESRD on HD, CHF, aortic stenosis, hypertension, diabetes mellitus type 2, hyperlipidemia, obstructive sleep apnea, chronic venous stasis with LE edema, and obesity who was admitted to [facility] on 12/17/2019 for ongoing treatment of respiratory failure, ESRD, malnutrition, and generalized debility.    1.  ESRD on HD MWF.    Patient due for dialysis treatment today.  Orders have been prepared.  2.  Acute respiratory failure.    Undergoing weaning protocol.  At the moment he is off of the ventilator.  3.  Secondary hyperparathyroidism.    Phosphorus within target range at 3.4.  Continue to  monitor.  4.  Anemia of chronic kidney disease/thrombocytopenia.  Hemoglobin continues to fluctuate.  Currently 7.7.  No urgent indication for transfusion.-Heparin antibodies and serotonin release assay were negative.   LOS: 0 Zameria Vogl 5/17/20218:24 AM

## 2020-02-22 ENCOUNTER — Other Ambulatory Visit (HOSPITAL_COMMUNITY): Payer: Medicare Other

## 2020-02-22 DIAGNOSIS — J90 Pleural effusion, not elsewhere classified: Secondary | ICD-10-CM | POA: Diagnosis not present

## 2020-02-22 DIAGNOSIS — I5022 Chronic systolic (congestive) heart failure: Secondary | ICD-10-CM | POA: Diagnosis not present

## 2020-02-22 DIAGNOSIS — I482 Chronic atrial fibrillation, unspecified: Secondary | ICD-10-CM | POA: Diagnosis not present

## 2020-02-22 DIAGNOSIS — J9621 Acute and chronic respiratory failure with hypoxia: Secondary | ICD-10-CM | POA: Diagnosis not present

## 2020-02-22 LAB — BLOOD CULTURE ID PANEL (REFLEXED)
Acinetobacter baumannii: NOT DETECTED
Candida albicans: NOT DETECTED
Candida glabrata: NOT DETECTED
Candida krusei: NOT DETECTED
Candida parapsilosis: NOT DETECTED
Candida tropicalis: NOT DETECTED
Carbapenem resistance: DETECTED — AB
Enterobacter cloacae complex: NOT DETECTED
Enterobacteriaceae species: DETECTED — AB
Enterococcus species: NOT DETECTED
Escherichia coli: NOT DETECTED
Haemophilus influenzae: NOT DETECTED
Klebsiella oxytoca: NOT DETECTED
Klebsiella pneumoniae: DETECTED — AB
Listeria monocytogenes: NOT DETECTED
Methicillin resistance: DETECTED — AB
Neisseria meningitidis: NOT DETECTED
Proteus species: DETECTED — AB
Pseudomonas aeruginosa: NOT DETECTED
Serratia marcescens: NOT DETECTED
Staphylococcus aureus (BCID): DETECTED — AB
Staphylococcus species: DETECTED — AB
Streptococcus agalactiae: NOT DETECTED
Streptococcus pneumoniae: NOT DETECTED
Streptococcus pyogenes: NOT DETECTED
Streptococcus species: NOT DETECTED

## 2020-02-22 LAB — HEPATITIS B SURFACE ANTIBODY, QUANTITATIVE: Hep B S AB Quant (Post): 4.1 m[IU]/mL — ABNORMAL LOW (ref >9.9)

## 2020-02-24 LAB — CARBAPENEM RESISTANCE PANEL
Carba Resistance IMP Gene: NOT DETECTED — AB
Carba Resistance KPC Gene: DETECTED — AB
Carba Resistance NDM Gene: NOT DETECTED — AB
Carba Resistance OXA48 Gene: NOT DETECTED — AB
Carba Resistance VIM Gene: NOT DETECTED — AB

## 2020-02-24 LAB — CULTURE, RESPIRATORY W GRAM STAIN: Gram Stain: NONE SEEN

## 2020-02-26 LAB — CULTURE, BLOOD (ROUTINE X 2)
Special Requests: ADEQUATE
Special Requests: ADEQUATE

## 2020-03-07 NOTE — Progress Notes (Signed)
Pulmonary Critical Care Medicine [place]   PULMONARY CRITICAL CARE SERVICE  PROGRESS NOTE  Date of Service: 03-05-20  Corby Vandenberghe  WEX:[id number]  DOB: Mar 29, 1951   DOA: 12/08/2019  Referring Physician: Merton Border, MD  HPI: Aviv Lengacher is a 69 y.o. male seen for follow up of Acute on Chronic Respiratory Failure.  Patient was attempted on T collar but lasted only 15 minutes.  Apparently has been made DNR by the family remains on the vent right now on pressure support  Medications: Reviewed on Rounds  Physical Exam:  Vitals: Temperature is 96.4 pulse 108 respiratory 24 blood pressure is 100/51 saturations 99%  Ventilator Settings on pressure support FiO2 40% pressure 12 PEEP of 8 tidal volume is 514  . General: Comfortable at this time . Eyes: Grossly normal lids, irises & conjunctiva . ENT: grossly tongue is normal . Neck: no obvious mass . Cardiovascular: S1 S2 normal no gallop . Respiratory: Scattered coarse breath sounds noted bilaterally . Abdomen: soft . Skin: no rash seen on limited exam . Musculoskeletal: not rigid . Psychiatric:unable to assess . Neurologic: no seizure no involuntary movements         Lab Data:   Basic Metabolic Panel: Recent Labs  Lab 02/16/20 0741 02/18/20 0951 02/21/20 0710  NA 139 137 136  K 5.1 4.2 5.1  CL 100 98 97*  CO2 29 27 25   GLUCOSE 246* 161* 207*  BUN 85* 87* 96*  CREATININE 3.10* 3.16* 3.30*  CALCIUM 8.9 8.7* 9.0  PHOS 2.3* 2.2* 3.4    ABG: No results for input(s): PHART, PCO2ART, PO2ART, HCO3, O2SAT in the last 168 hours.  Liver Function Tests: Recent Labs  Lab 02/16/20 0741 02/18/20 0951 02/21/20 0710  ALBUMIN 2.3* 2.3* 2.3*   No results for input(s): LIPASE, AMYLASE in the last 168 hours. No results for input(s): AMMONIA in the last 168 hours.  CBC: Recent Labs  Lab 02/16/20 0741 02/18/20 0951 02/20/20 0502 02/21/20 0710  WBC 13.9* 14.2* 17.5* 25.7*  HGB 8.0* 8.0* 7.0* 7.7*   HCT 27.7* 26.1* 23.8* 26.7*  MCV 101.5* 98.9 97.1 98.5  PLT 36* 43* 54* 79*    Cardiac Enzymes: No results for input(s): CKTOTAL, CKMB, CKMBINDEX, TROPONINI in the last 168 hours.  BNP (last 3 results) No results for input(s): BNP in the last 8760 hours.  ProBNP (last 3 results) No results for input(s): PROBNP in the last 8760 hours.  Radiological Exams: DG CHEST PORT 1 VIEW  Result Date: 2020-03-05 CLINICAL DATA:  Leukocytosis EXAM: PORTABLE CHEST 1 VIEW COMPARISON:  02/15/2020 FINDINGS: Mildly degraded exam due to AP portable technique and patient body habitus. Tracheostomy appropriately position. Double lumen subclavian line unchanged in position. Moderate cardiomegaly, accentuated by AP portable technique. Layering bilateral pleural effusions are similar. No pneumothorax. Lower lung predominant interstitial and airspace disease, slightly increased on the right. IMPRESSION: Slightly worsened right-sided aeration. Otherwise similar pleural fluid with interstitial and airspace disease. At least partially felt to be due to pulmonary edema. Concurrent pneumonia cannot be excluded. Electronically Signed   By: Abigail Miyamoto M.D.   On: 03-05-2020 08:43    Assessment/Plan Active Problems:   Acute on chronic respiratory failure with hypoxia (HCC)   Chronic systolic (congestive) heart failure (HCC)   End stage renal disease on dialysis [facility])   Chronic atrial fibrillation (HCC)   Bilateral pleural effusion   1. Acute on chronic respiratory failure hypoxia patient has basically been failing every attempt at weaning right now.  Right now is on pressure support which will be continued he has been failing T collar attempts repeatedly 2. Chronic systolic heart failure monitor fluid status continue supportive care 3. End-stage renal failure on hemodialysis followed by nephrology with their recommendations 4. Chronic atrial fibrillation rate controlled continue to monitor continue with medical  management.   I have personally seen and evaluated the patient, evaluated laboratory and imaging results, formulated the assessment and plan and placed orders. The Patient requires high complexity decision making with multiple systems involvement.  Rounds were done with the Respiratory Therapy Director and Staff therapists and discussed with nursing staff also.  Allyne Gee, MD [facility] Pulmonary Critical Care Medicine Sleep Medicine

## 2020-03-07 DEATH — deceased

## 2021-07-09 IMAGING — DX DG ABD PORTABLE 1V
3 series · 3 of 3 positions shown · non-contrast
Comparison: Yesterday

CLINICAL DATA: Ileus

EXAM:
PORTABLE ABDOMEN - 1 VIEW

[abdomen kub (1 of 3)]
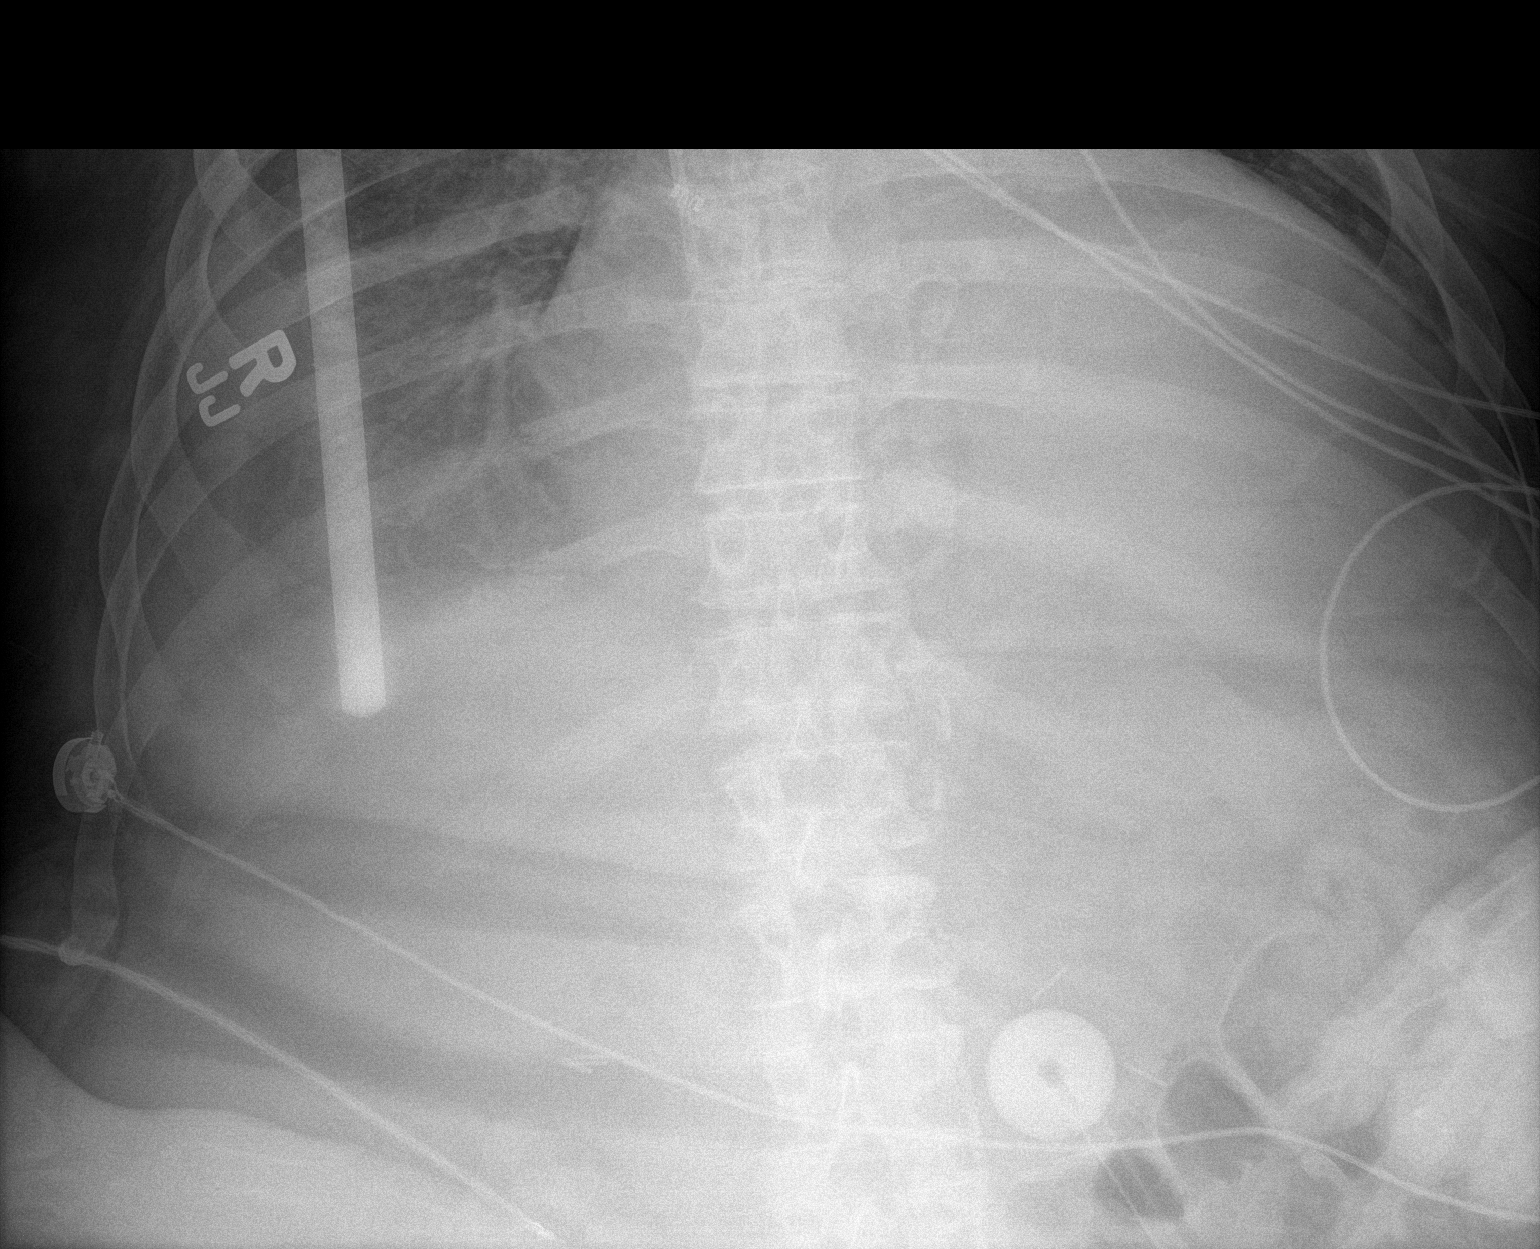

[abdomen kub (2 of 3)]
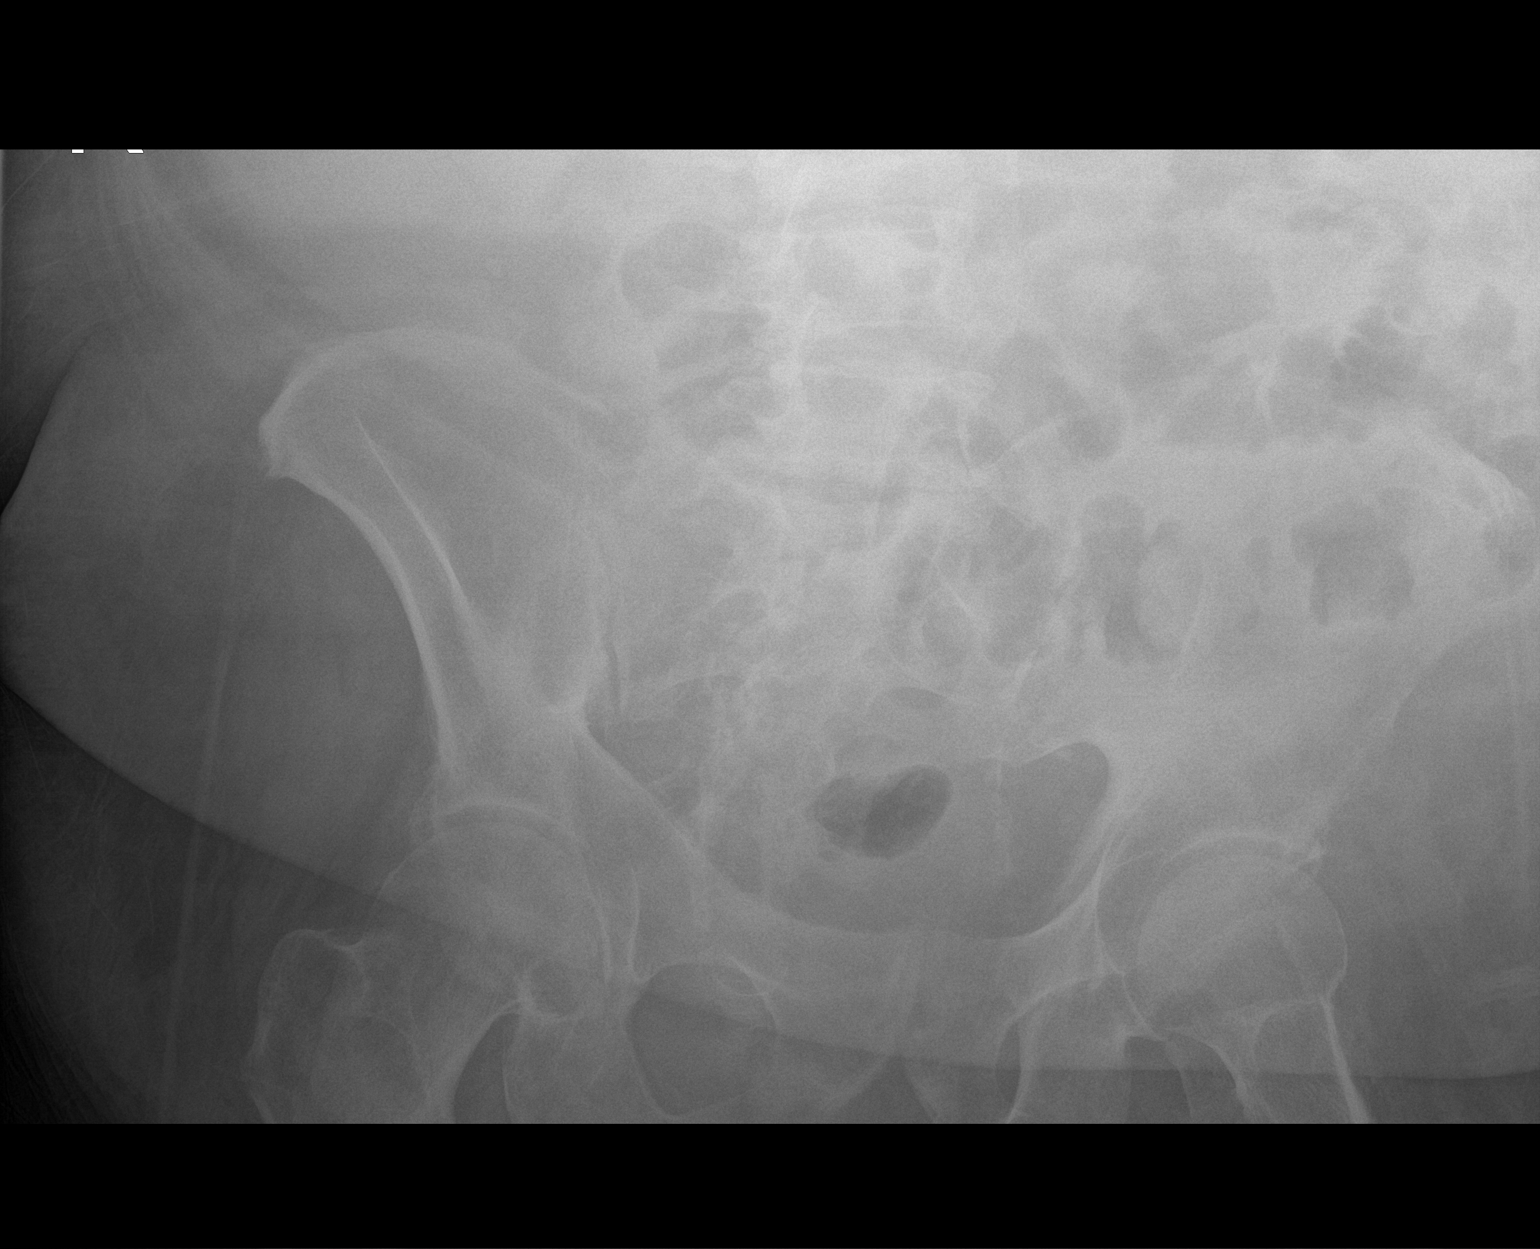

[abdomen kub (3 of 3)]
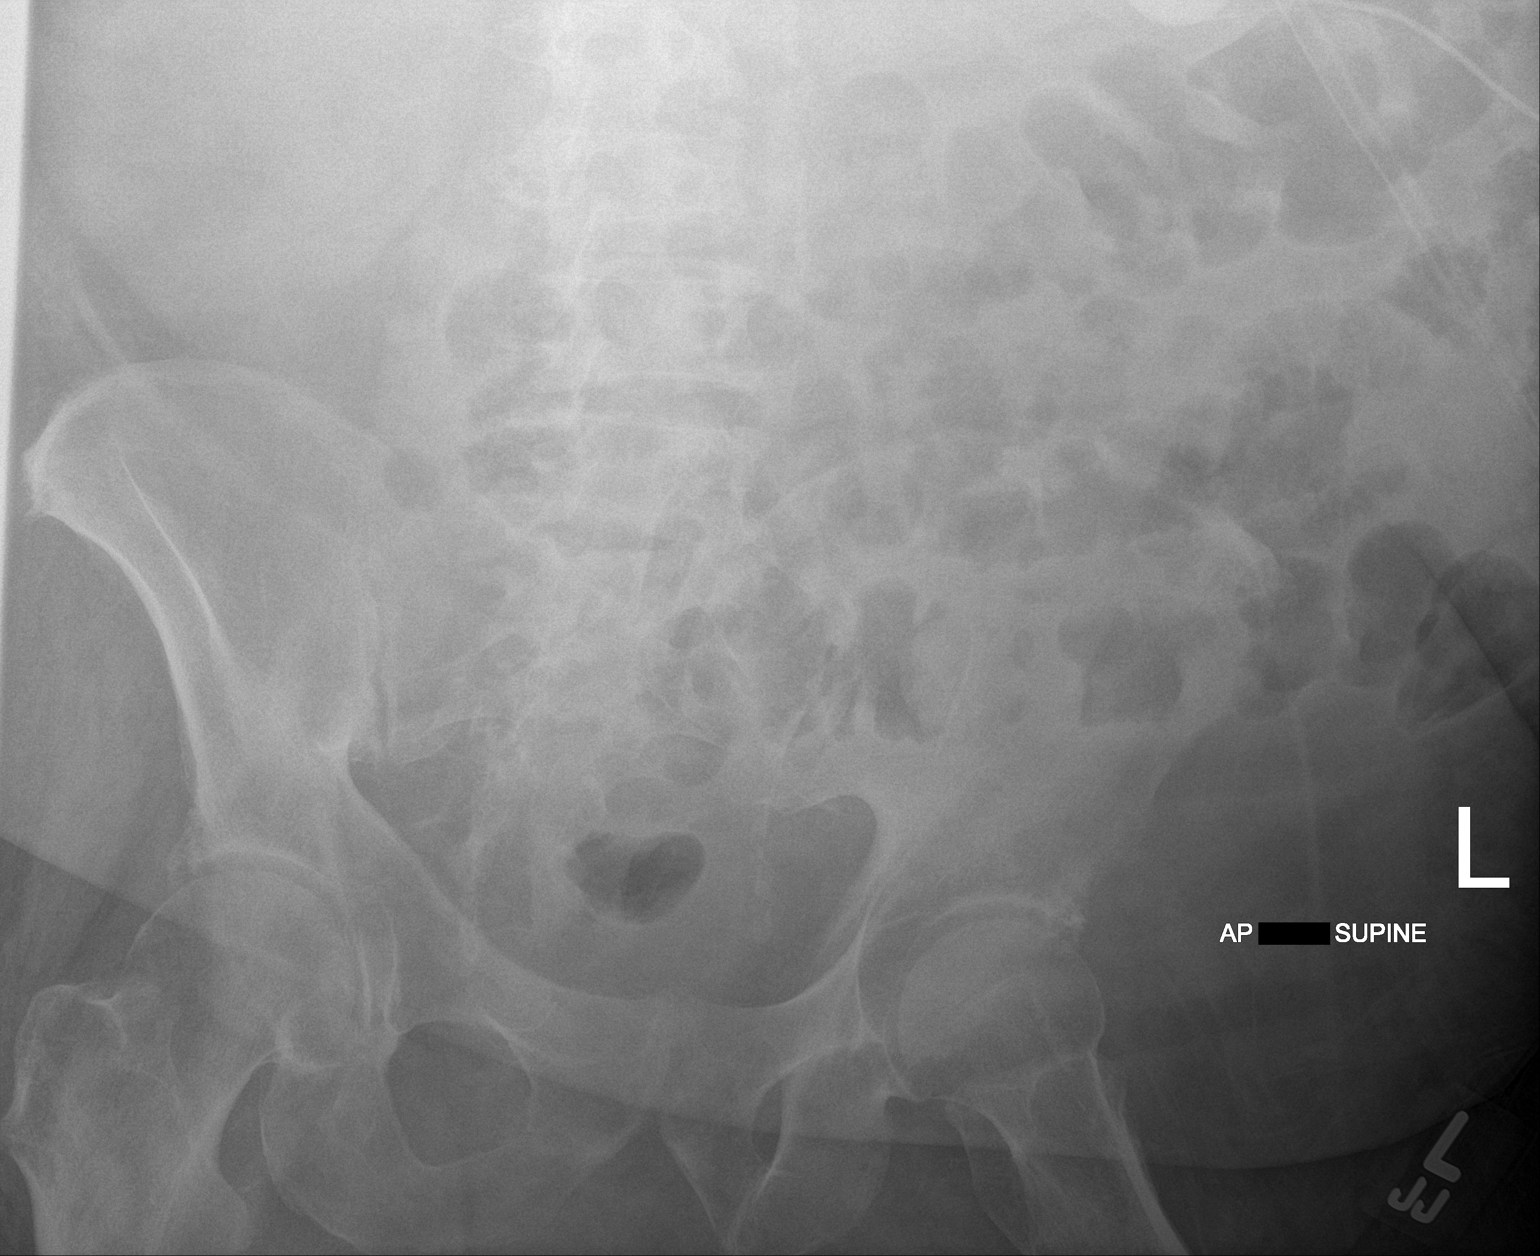

[3 of 3 positions shown; findings below may reference images not displayed]

FINDINGS: Cardiomegaly with haziness of the lower chest attributed to pleural
effusions. There is dedicated chest x-ray comparison. There is less
gaseous distension of bowel today. Percutaneous gastrostomy tube
noted.
IMPRESSION: Improved bowel distension with essentially normalized bowel gas
pattern.

## 2021-07-10 IMAGING — DX DG CHEST 1V PORT
2 series · 2 of 2 positions shown · non-contrast
Comparison: 12/23/2019

CLINICAL DATA: Status post CPR. Respiratory failure.

EXAM:
PORTABLE CHEST 1 VIEW

[chest ap (1 of 2)]
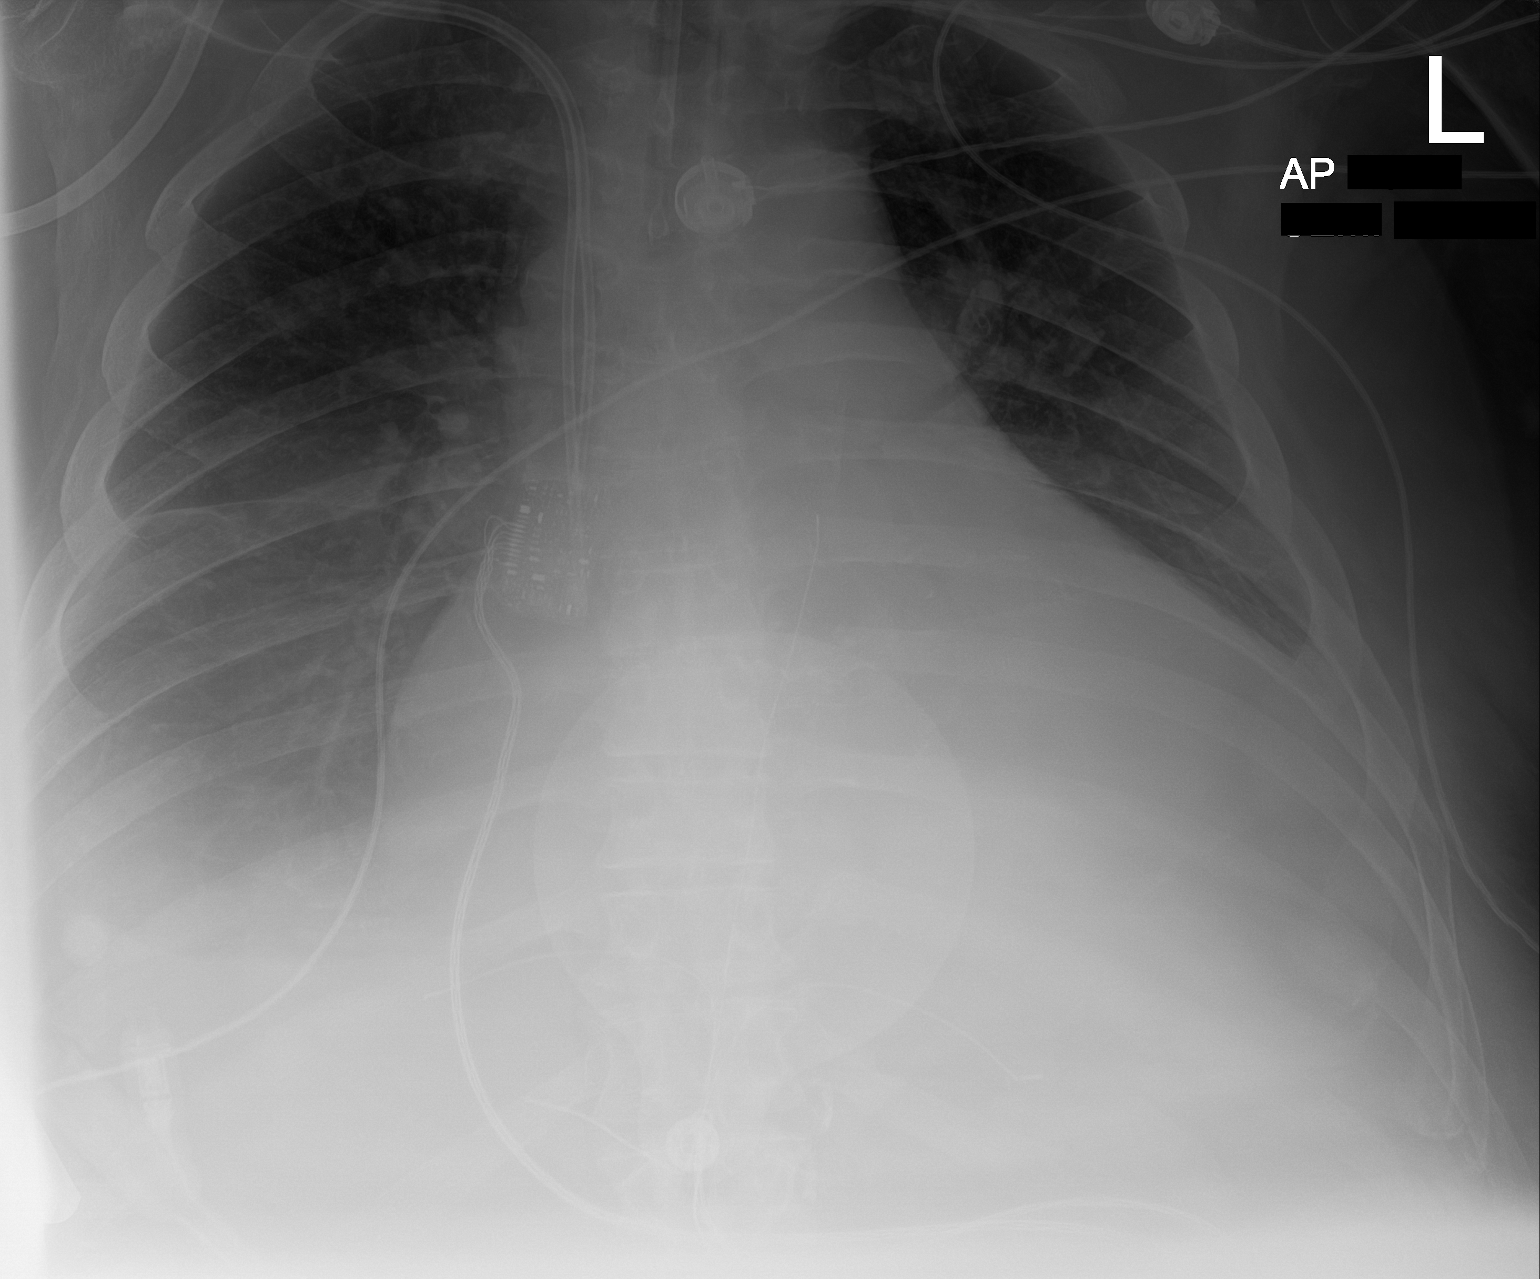

[chest ap (2 of 2)]
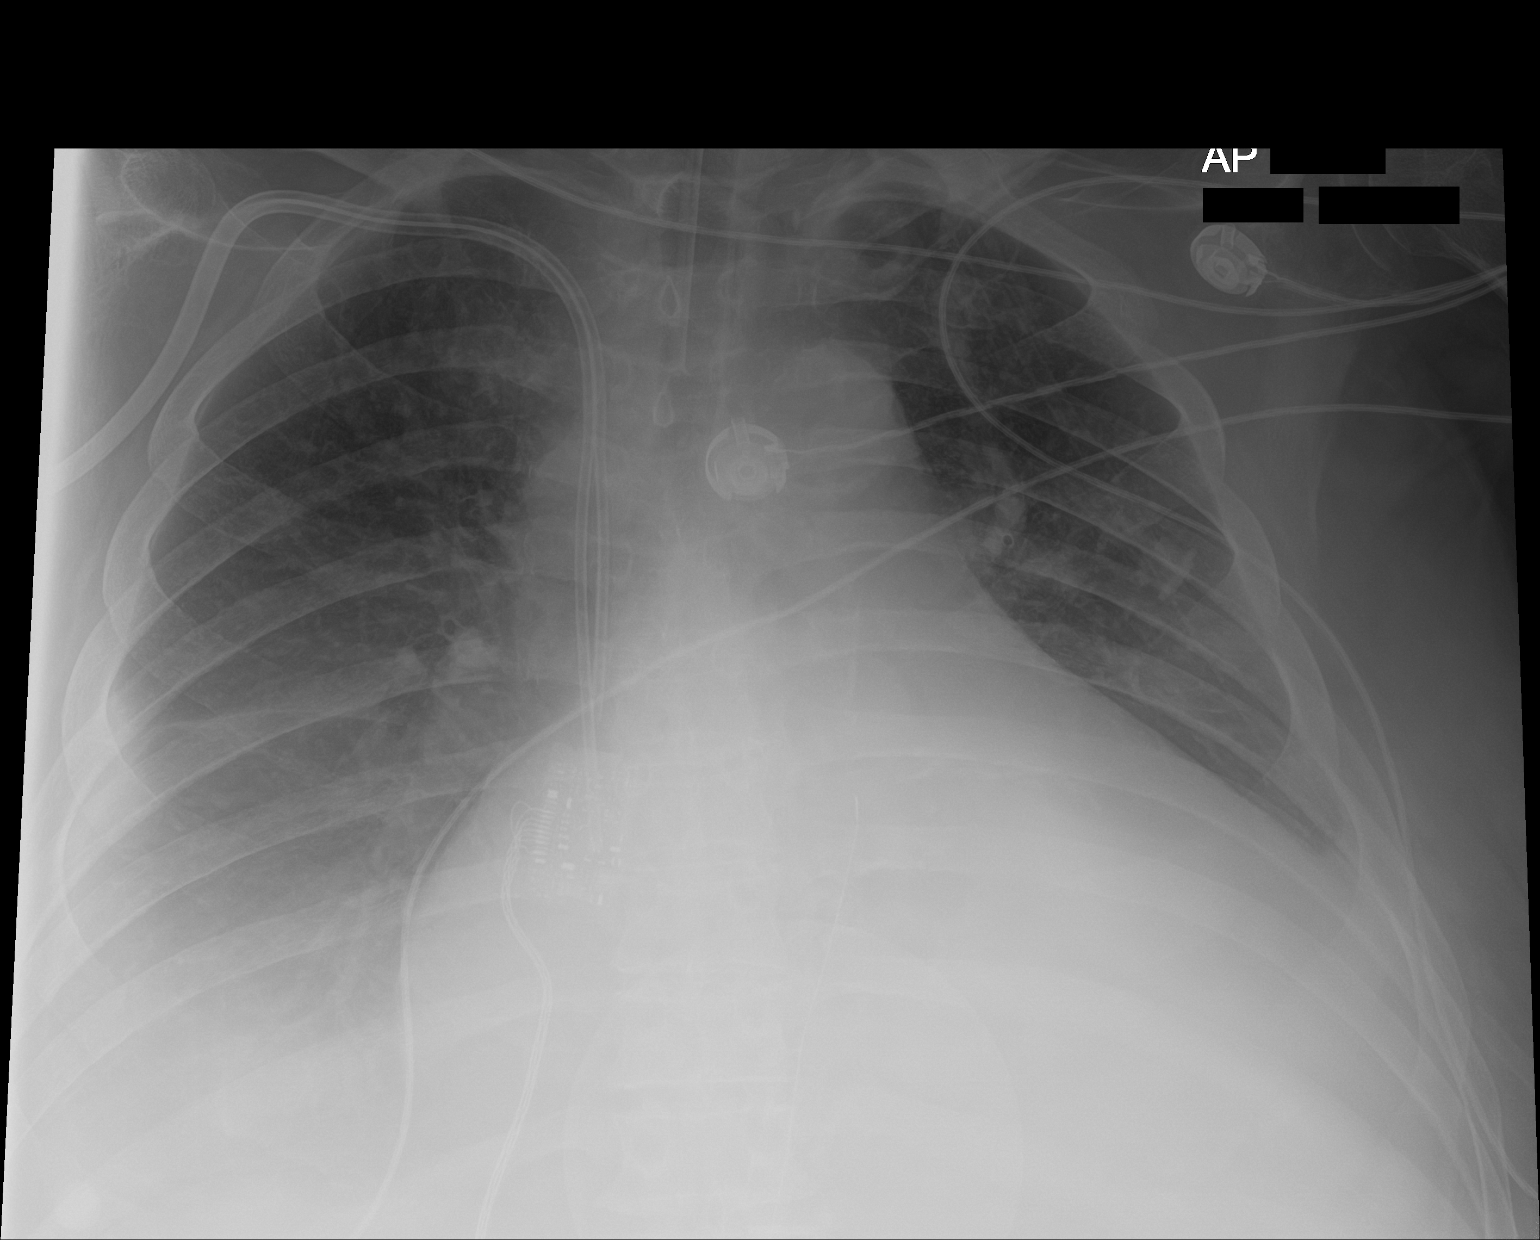

[2 of 2 positions shown; findings below may reference images not displayed]

FINDINGS: Endotracheal tube is in place with tip 2.7 centimeters above the
carina. RIGHT subclavian dual lumen central line tip overlies the
superior vena cava.

Heart is enlarged and stable in configuration. There are bibasilar
opacities which completely obscure the hemidiaphragms have a stable
in appearance. Findings are consistent with bilateral pleural
effusions and bibasilar opacities. Perihilar opacities are
consistent with pulmonary edema.
IMPRESSION: 1. Stable appearance of the chest.
2. Pulmonary edema and bilateral pleural effusions.

## 2021-07-11 IMAGING — DX DG CHEST 1V PORT
1 series · 1 of 1 positions shown · non-contrast
Comparison: December 25, 2019

CLINICAL DATA: Pneumonia

EXAM:
PORTABLE CHEST 1 VIEW

[chest ap]
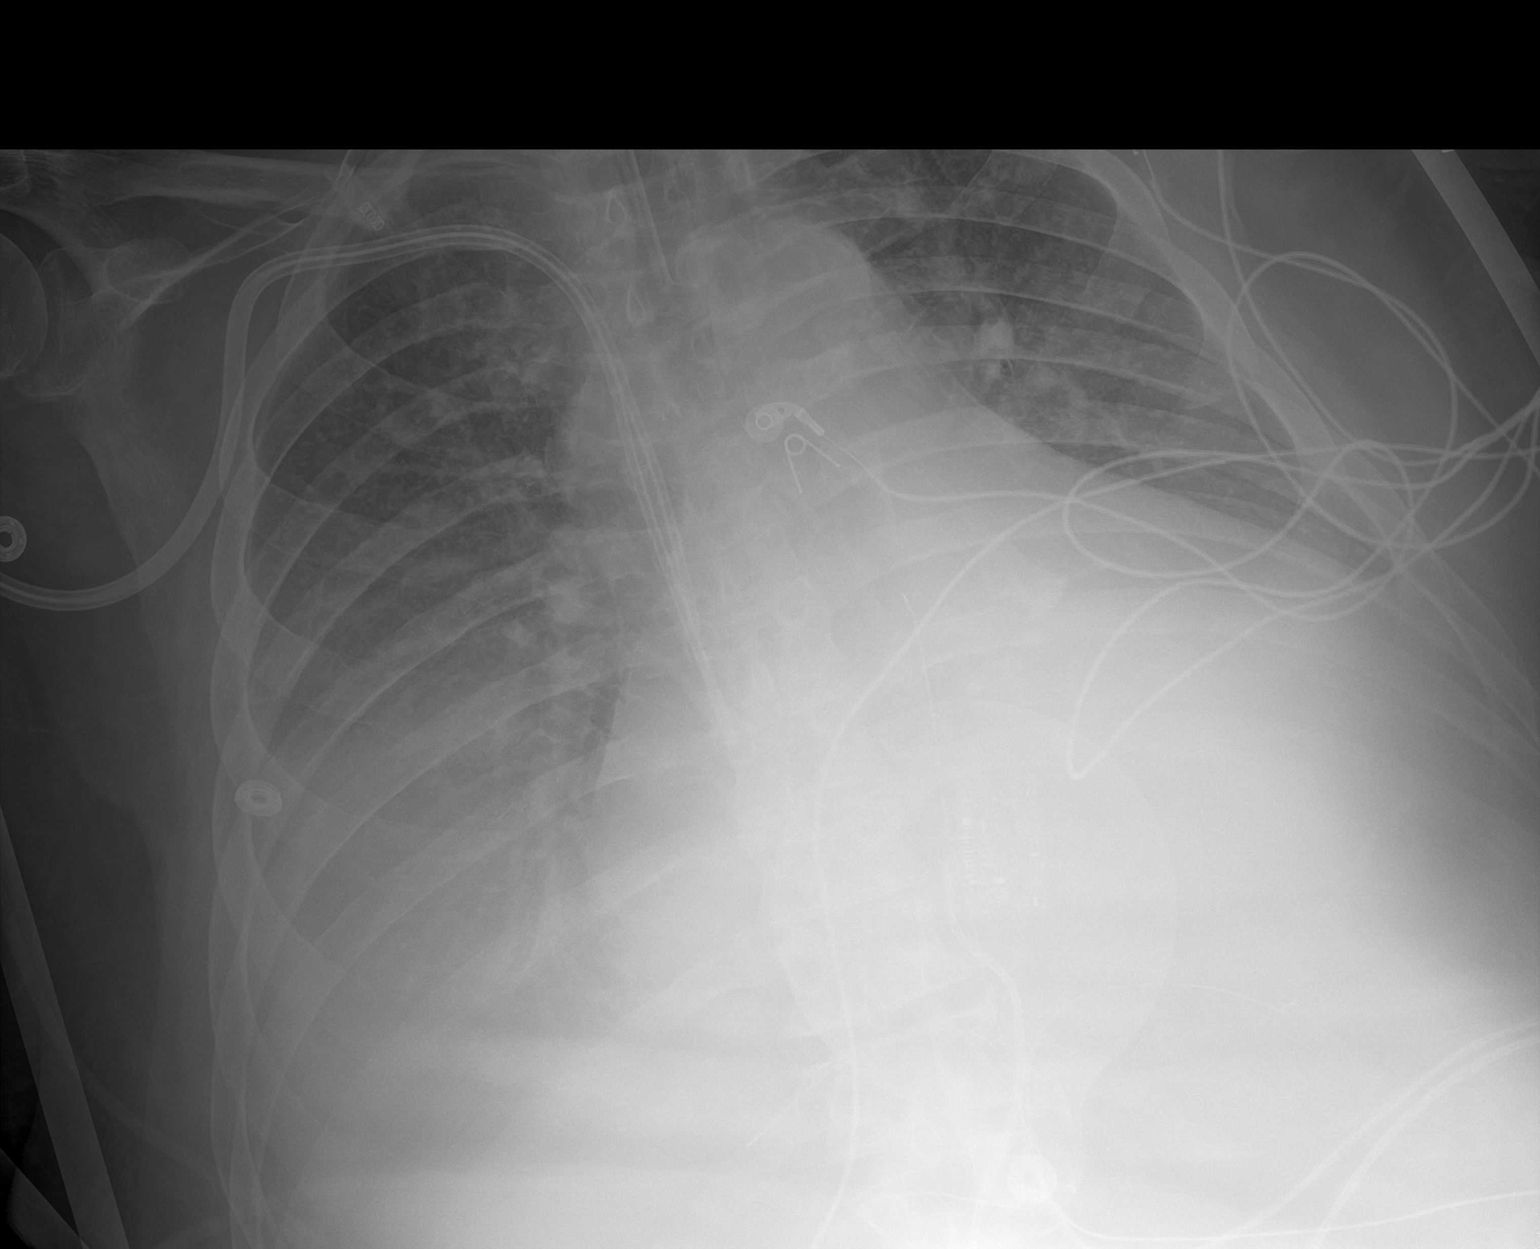

[1 of 1 positions shown; findings below may reference images not displayed]

FINDINGS: The tracheostomy tube is stable. A right central line is stable.
Stable cardiomegaly. Diffuse bilateral interstitial opacities. Hazy
opacity in the right base, likely layering effusion. Opacity in the
left retrocardiac region obscures the left hemidiaphragm. No
pneumothorax. No other acute abnormalities.
IMPRESSION: 1. Support apparatus as above.
2. Probable mild pulmonary edema.
3. Probable layering effusion with underlying atelectasis on the
right.
4. The opacity in left retrocardiac region may be atelectasis as
well.

## 2021-07-11 IMAGING — DX DG CHEST 1V PORT
1 series · 1 of 1 positions shown · non-contrast
Comparison: Chest x-ray 12/26/2019.

CLINICAL DATA: 68-year-old male status post PICC placement.

EXAM:
PORTABLE CHEST 1 VIEW

[chest ap]
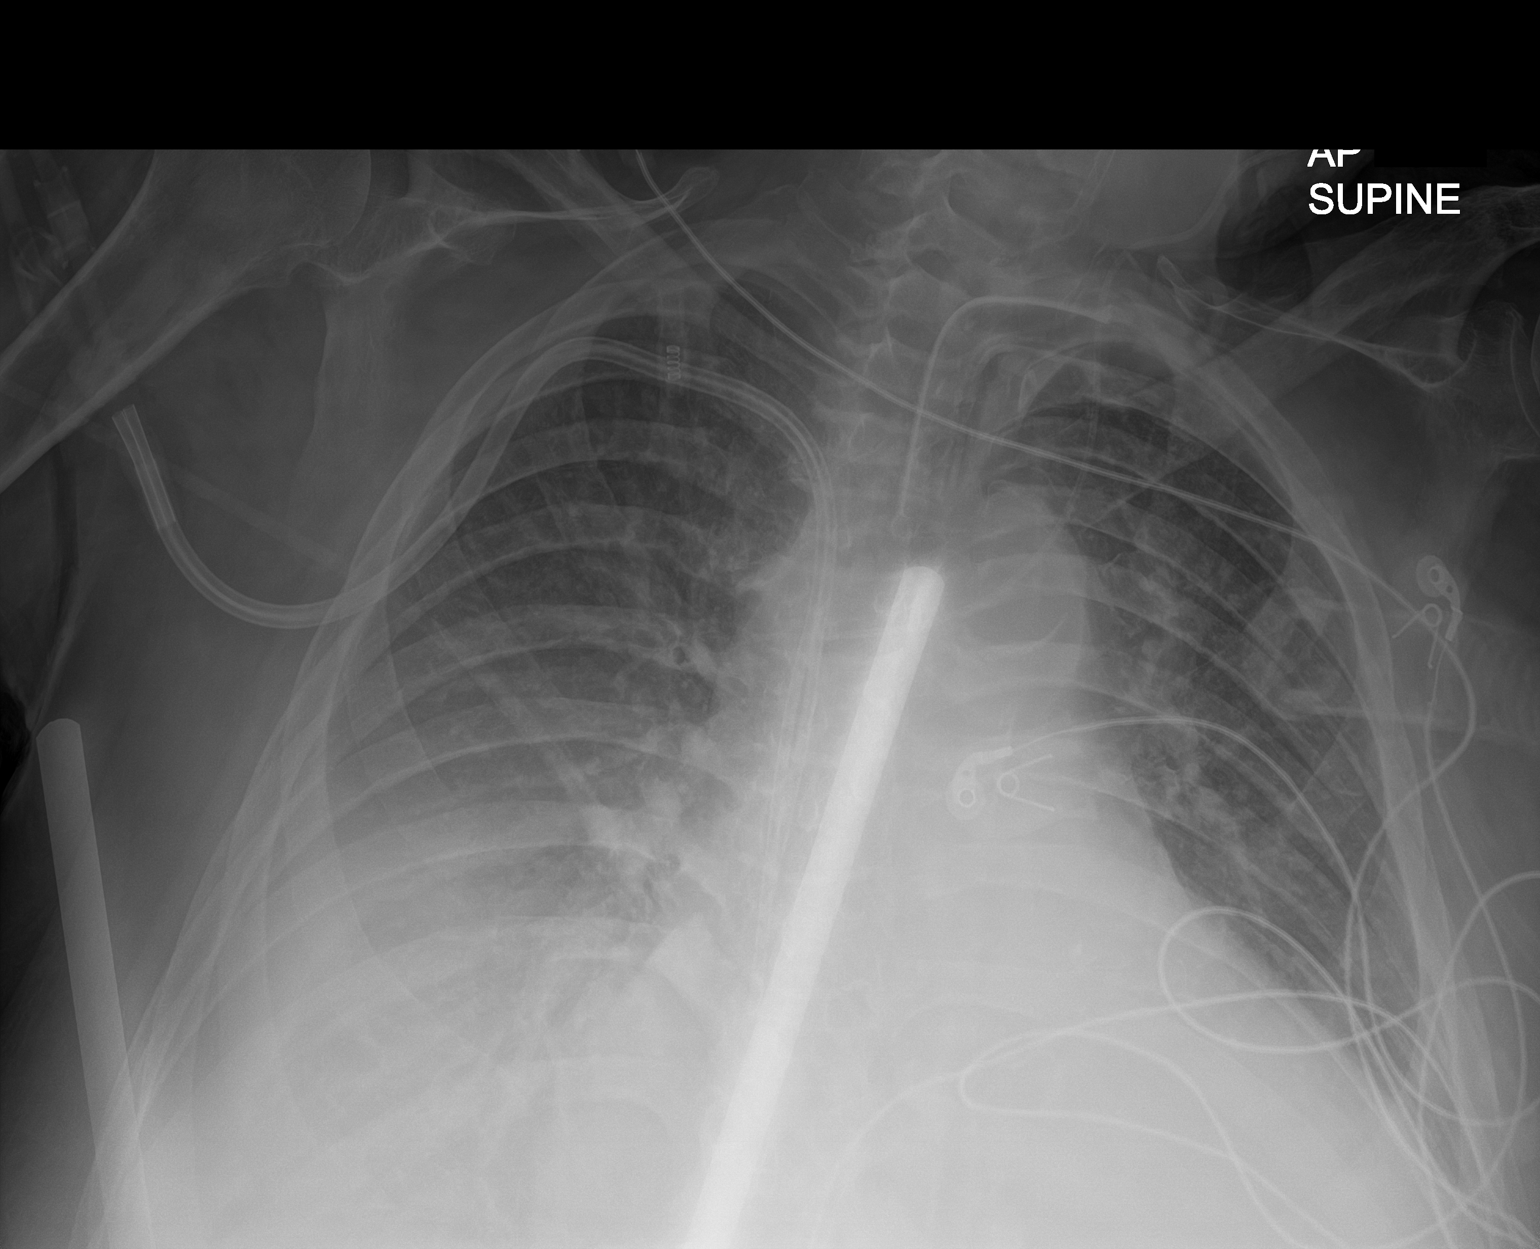

[1 of 1 positions shown; findings below may reference images not displayed]

FINDINGS: A tracheostomy tube is in place with tip 4.2 cm above the carina.
Right subclavian PermCath with tips terminating in the distal
superior vena cava and superior aspect of the right atrium. New left
IJ central venous catheter with tip terminating in the mid superior
vena cava. Lung volumes are low. Extensive bibasilar opacities which
may reflect areas of atelectasis and/or consolidation. Moderate
right and small left pleural effusions. Moderate cardiomegaly. The
patient is rotated to the left on today's exam, resulting in
distortion of the mediastinal contours and reduced diagnostic
sensitivity and specificity for mediastinal pathology. Aortic
atherosclerosis.
IMPRESSION: 1. Support apparatus, as above.
2. Low lung volumes with persistent bibasilar areas of atelectasis
and/or consolidation with moderate right and small left pleural
effusions.
3. Moderate cardiomegaly.
4. Aortic atherosclerosis.
